# Patient Record
Sex: Female | Born: 1945 | Race: Black or African American | Hispanic: No | Smoking: Former smoker
Health system: Southern US, Community
[De-identification: ages and names within clinical notes are randomized; demographics above are authoritative.]

## PROBLEM LIST (undated history)

## (undated) DIAGNOSIS — E785 Hyperlipidemia, unspecified: Secondary | ICD-10-CM

## (undated) DIAGNOSIS — M25561 Pain in right knee: Secondary | ICD-10-CM

## (undated) DIAGNOSIS — E119 Type 2 diabetes mellitus without complications: Secondary | ICD-10-CM

## (undated) DIAGNOSIS — M25562 Pain in left knee: Secondary | ICD-10-CM

## (undated) DIAGNOSIS — C801 Malignant (primary) neoplasm, unspecified: Secondary | ICD-10-CM

## (undated) DIAGNOSIS — R636 Underweight: Secondary | ICD-10-CM

## (undated) DIAGNOSIS — I639 Cerebral infarction, unspecified: Secondary | ICD-10-CM

## (undated) DIAGNOSIS — I10 Essential (primary) hypertension: Secondary | ICD-10-CM

## (undated) DIAGNOSIS — G629 Polyneuropathy, unspecified: Secondary | ICD-10-CM

## (undated) HISTORY — DX: Pain in right knee: M25.562

## (undated) HISTORY — DX: Polyneuropathy, unspecified: G62.9

## (undated) HISTORY — DX: Underweight: R63.6

## (undated) HISTORY — DX: Hyperlipidemia, unspecified: E78.5

## (undated) HISTORY — DX: Malignant (primary) neoplasm, unspecified: C80.1

## (undated) HISTORY — DX: Pain in right knee: M25.561

---

## 2012-06-17 ENCOUNTER — Other Ambulatory Visit (HOSPITAL_COMMUNITY): Payer: Self-pay | Admitting: Family Medicine

## 2012-06-17 DIAGNOSIS — Z1231 Encounter for screening mammogram for malignant neoplasm of breast: Secondary | ICD-10-CM

## 2012-08-06 ENCOUNTER — Ambulatory Visit (HOSPITAL_COMMUNITY)
Admission: RE | Admit: 2012-08-06 | Discharge: 2012-08-06 | Disposition: A | Discharge status: Home / Self Care | Payer: Medicare Other | Source: Ambulatory Visit | Attending: Family Medicine | Admitting: Family Medicine

## 2012-08-06 DIAGNOSIS — Z1231 Encounter for screening mammogram for malignant neoplasm of breast: Secondary | ICD-10-CM | POA: Insufficient documentation

## 2012-08-27 ENCOUNTER — Other Ambulatory Visit: Payer: Self-pay | Admitting: Family Medicine

## 2012-08-27 DIAGNOSIS — R928 Other abnormal and inconclusive findings on diagnostic imaging of breast: Secondary | ICD-10-CM

## 2012-10-06 ENCOUNTER — Other Ambulatory Visit: Payer: Medicare Other

## 2012-10-13 ENCOUNTER — Ambulatory Visit
Admission: RE | Admit: 2012-10-13 | Discharge: 2012-10-13 | Disposition: A | Discharge status: Home / Self Care | Payer: Medicare Other | Source: Ambulatory Visit | Attending: Family Medicine | Admitting: Family Medicine

## 2012-10-13 DIAGNOSIS — R928 Other abnormal and inconclusive findings on diagnostic imaging of breast: Secondary | ICD-10-CM

## 2013-09-23 ENCOUNTER — Other Ambulatory Visit (HOSPITAL_COMMUNITY): Payer: Self-pay | Admitting: Family Medicine

## 2013-09-23 DIAGNOSIS — Z1231 Encounter for screening mammogram for malignant neoplasm of breast: Secondary | ICD-10-CM

## 2013-10-05 ENCOUNTER — Ambulatory Visit (HOSPITAL_COMMUNITY)
Admission: RE | Admit: 2013-10-05 | Discharge: 2013-10-05 | Disposition: A | Discharge status: Home / Self Care | Payer: Medicare HMO | Source: Ambulatory Visit | Attending: Family Medicine | Admitting: Family Medicine

## 2013-10-05 DIAGNOSIS — Z1231 Encounter for screening mammogram for malignant neoplasm of breast: Secondary | ICD-10-CM | POA: Insufficient documentation

## 2016-10-23 ENCOUNTER — Other Ambulatory Visit: Payer: Self-pay | Admitting: Family Medicine

## 2016-10-23 DIAGNOSIS — Z1231 Encounter for screening mammogram for malignant neoplasm of breast: Secondary | ICD-10-CM

## 2016-11-14 ENCOUNTER — Ambulatory Visit: Payer: Medicare HMO

## 2016-11-14 ENCOUNTER — Ambulatory Visit
Admission: RE | Admit: 2016-11-14 | Discharge: 2016-11-14 | Disposition: A | Discharge status: Home / Self Care | Payer: Medicare HMO | Source: Ambulatory Visit | Attending: Family Medicine | Admitting: Family Medicine

## 2016-11-14 DIAGNOSIS — Z1231 Encounter for screening mammogram for malignant neoplasm of breast: Secondary | ICD-10-CM

## 2017-05-24 ENCOUNTER — Ambulatory Visit
Admission: RE | Admit: 2017-05-24 | Discharge: 2017-05-24 | Disposition: A | Discharge status: Home / Self Care | Payer: Medicare HMO | Source: Ambulatory Visit | Attending: Physician Assistant | Admitting: Physician Assistant

## 2017-05-24 ENCOUNTER — Other Ambulatory Visit: Payer: Self-pay | Admitting: Physician Assistant

## 2017-05-24 DIAGNOSIS — G8929 Other chronic pain: Secondary | ICD-10-CM

## 2017-05-24 DIAGNOSIS — M25561 Pain in right knee: Principal | ICD-10-CM

## 2017-12-26 ENCOUNTER — Other Ambulatory Visit: Payer: Self-pay

## 2017-12-26 ENCOUNTER — Emergency Department (HOSPITAL_COMMUNITY): Payer: Medicare HMO

## 2017-12-26 ENCOUNTER — Emergency Department (HOSPITAL_COMMUNITY)
Admission: EM | Admit: 2017-12-26 | Discharge: 2017-12-26 | Disposition: A | Discharge status: Home / Self Care | Payer: Medicare HMO | Attending: Emergency Medicine | Admitting: Emergency Medicine

## 2017-12-26 ENCOUNTER — Encounter (HOSPITAL_COMMUNITY): Payer: Self-pay | Admitting: Emergency Medicine

## 2017-12-26 DIAGNOSIS — N939 Abnormal uterine and vaginal bleeding, unspecified: Secondary | ICD-10-CM | POA: Insufficient documentation

## 2017-12-26 DIAGNOSIS — I10 Essential (primary) hypertension: Secondary | ICD-10-CM | POA: Insufficient documentation

## 2017-12-26 DIAGNOSIS — D649 Anemia, unspecified: Secondary | ICD-10-CM | POA: Diagnosis present

## 2017-12-26 DIAGNOSIS — N390 Urinary tract infection, site not specified: Secondary | ICD-10-CM | POA: Diagnosis not present

## 2017-12-26 DIAGNOSIS — E119 Type 2 diabetes mellitus without complications: Secondary | ICD-10-CM | POA: Insufficient documentation

## 2017-12-26 DIAGNOSIS — Z7902 Long term (current) use of antithrombotics/antiplatelets: Secondary | ICD-10-CM | POA: Diagnosis not present

## 2017-12-26 DIAGNOSIS — Z7984 Long term (current) use of oral hypoglycemic drugs: Secondary | ICD-10-CM | POA: Diagnosis not present

## 2017-12-26 HISTORY — DX: Type 2 diabetes mellitus without complications: E11.9

## 2017-12-26 HISTORY — DX: Cerebral infarction, unspecified: I63.9

## 2017-12-26 HISTORY — DX: Essential (primary) hypertension: I10

## 2017-12-26 LAB — COMPREHENSIVE METABOLIC PANEL
ALT: 10 U/L — AB (ref 14–54)
AST: 20 U/L (ref 15–41)
Albumin: 3.5 g/dL (ref 3.5–5.0)
Alkaline Phosphatase: 93 U/L (ref 38–126)
Anion gap: 12 (ref 5–15)
BUN: 15 mg/dL (ref 6–20)
CO2: 23 mmol/L (ref 22–32)
Calcium: 9.5 mg/dL (ref 8.9–10.3)
Chloride: 98 mmol/L — ABNORMAL LOW (ref 101–111)
Creatinine, Ser: 0.67 mg/dL (ref 0.44–1.00)
Glucose, Bld: 170 mg/dL — ABNORMAL HIGH (ref 65–99)
Potassium: 4.5 mmol/L (ref 3.5–5.1)
Sodium: 133 mmol/L — ABNORMAL LOW (ref 135–145)
Total Bilirubin: 1 mg/dL (ref 0.3–1.2)
Total Protein: 9.2 g/dL — ABNORMAL HIGH (ref 6.5–8.1)

## 2017-12-26 LAB — URINALYSIS, ROUTINE W REFLEX MICROSCOPIC
Bilirubin Urine: NEGATIVE
GLUCOSE, UA: NEGATIVE mg/dL
Ketones, ur: NEGATIVE mg/dL
Nitrite: NEGATIVE
PROTEIN: 30 mg/dL — AB
Specific Gravity, Urine: 1.019 (ref 1.005–1.030)
pH: 5 (ref 5.0–8.0)

## 2017-12-26 LAB — TYPE AND SCREEN
ABO/RH(D): O POS
ANTIBODY SCREEN: POSITIVE
DAT, IgG: POSITIVE

## 2017-12-26 LAB — CBC
HCT: 28.7 % — ABNORMAL LOW (ref 36.0–46.0)
Hemoglobin: 8.8 g/dL — ABNORMAL LOW (ref 12.0–15.0)
MCH: 30.7 pg (ref 26.0–34.0)
MCHC: 30.7 g/dL (ref 30.0–36.0)
MCV: 100 fL (ref 78.0–100.0)
Platelets: 486 10*3/uL — ABNORMAL HIGH (ref 150–400)
RBC: 2.87 MIL/uL — ABNORMAL LOW (ref 3.87–5.11)
RDW: 17.7 % — ABNORMAL HIGH (ref 11.5–15.5)
WBC: 16.4 10*3/uL — AB (ref 4.0–10.5)

## 2017-12-26 MED ORDER — CEPHALEXIN 500 MG PO CAPS: 500.0000 mg | ORAL_CAPSULE | Freq: Four times a day (QID) | ORAL | 0 refills | Status: DC

## 2017-12-26 NOTE — ED Notes (Signed)
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 Pt currently being discharged, will not need transfusion during this visit. Per MD butler

## 2017-12-26 NOTE — ED Notes (Signed)
Patient reports she fell last Sunday (04/28) and last Thursday from a slip and trip. Patient was seen by Dr. Lawerance Cruel yesterday. Blood work was taken. Patient reports Dr. Harrington Challenger informing her that she had an abnormal HGB but unsure of the value. Also, informed to be seen at ED for further evaluation. Instead of being seen yesterday, she decided to be seen this morning.

## 2017-12-26 NOTE — ED Triage Notes (Signed)
Pt verbalizes sent for abnormal hemoglobin; hx of vaginal bleeding; denies at present.

## 2017-12-26 NOTE — Discharge Instructions (Signed)
You were evaluated in the emergency department for anemia.  This probably explains your fatigue although we also identified that you have a urine infection.  We are placing you on antibiotics for this.  Also you have been experiencing some vaginal bleeding which is abnormal after menopause.  You will need to discuss with your doctor to get a biopsy.  You had an ultrasound here.  Your anemia is not bad enough to require transfusion here but this may be necessary in the future.  Please return to the emergency department if any worsening symptoms.

## 2017-12-26 NOTE — ED Provider Notes (Signed)
[facility] DEPT Provider Note   CSN: [id number] Arrival date & time: 12/26/17  0920     History   Chief Complaint Chief Complaint  Patient presents with  . Abnormal Lab    HPI Katie Myers is a 72 y.o. female.  She is brought in by her daughter for complaint of abnormal blood test results.  She had a routine visit by her PCP yesterday who drew some blood work and called her today telling that they need to go to the emergency department for anemia.  Patient has been complaining of increased fatigue over the last 4 or 5 months.  She has had a couple of falls.  She is also mention that intermittently she will have some spotting vaginal bleeding.  She denies any fevers chills weight loss chest pain shortness of breath abdominal pain urinary symptoms.  There is been no blood in vomit or stool.  She does not get any kind of gynecologic yearly testing since menopause.  The history is provided by the patient.  Weakness  Primary symptoms include no focal weakness, no speech change, no visual change, no auditory change. This is a new problem. Episode onset: 4-5 months. The problem has been gradually worsening. There was no focality noted. There has been no fever. Pertinent negatives include no shortness of breath, no chest pain, no vomiting, no altered mental status, no confusion and no headaches. There were no medications administered prior to arrival.    Past Medical History:  Diagnosis Date  . Diabetes mellitus without complication ([place])   . Hypertension   . Stroke [facility])     There are no active problems to display for this patient.   History reviewed. No pertinent surgical history.   OB History   None      Home Medications    Prior to Admission medications   Medication Sig Start Date End Date Taking? Authorizing Provider  amLODipine (NORVASC) 10 MG tablet Take 10 mg by mouth daily.   Yes [provider]  atorvastatin (LIPITOR) 40 MG  tablet Take 40 mg by mouth daily.   Yes [provider]  benazepril (LOTENSIN) 40 MG tablet Take 40 mg by mouth daily.   Yes [provider]  carvedilol (COREG) 3.125 MG tablet Take 3.125 mg by mouth 2 (two) times daily with a meal.   Yes [provider]  clopidogrel (PLAVIX) 75 MG tablet Take 75 mg by mouth daily.   Yes [provider]  gabapentin (NEURONTIN) 300 MG capsule Take 300 mg by mouth 4 (four) times daily.   Yes [provider]  glipiZIDE (GLUCOTROL XL) 10 MG 24 hr tablet Take 10 mg by mouth daily with breakfast.   Yes [provider]  hydrochlorothiazide (HYDRODIURIL) 25 MG tablet Take 25 mg by mouth daily.   Yes [provider]  ibuprofen (ADVIL,MOTRIN) 200 MG tablet Take 200 mg by mouth daily. Pt takes with coreg because coreg gives her headaches   Yes [provider]  metFORMIN (GLUCOPHAGE) 500 MG tablet Take 1,000 mg by mouth 2 (two) times daily with a meal.   Yes [provider]  oxyCODONE-acetaminophen (PERCOCET) 7.5-325 MG tablet Take 1 tablet by mouth every 8 (eight) hours as needed for severe pain.   Yes [provider]  potassium chloride (K-DUR) 10 MEQ tablet Take 10 mEq by mouth daily.   Yes [provider]  cephALEXin (KEFLEX) 500 MG capsule Take 1 capsule (500 mg total) by mouth  4 (four) times daily. 12/26/17   Hayden Rasmussen, MD    Family History No family history on file.  Social History Social History   Tobacco Use  . Smoking status: Not on file  Substance Use Topics  . Alcohol use: Not on file  . Drug use: Not on file  denies smoking or alcohol   Allergies   Patient has no known allergies.   Review of Systems Review of Systems  Constitutional: Positive for fatigue. Negative for chills and fever.  HENT: Negative for ear pain and sore throat.   Eyes: Negative for pain and visual disturbance.  Respiratory: Negative for cough and shortness of breath.     Cardiovascular: Negative for chest pain and palpitations.  Gastrointestinal: Negative for abdominal pain and vomiting.  Genitourinary: Positive for vaginal bleeding. Negative for dysuria and hematuria.  Musculoskeletal: Positive for arthralgias. Negative for back pain and neck pain.  Skin: Negative for color change and rash.  Neurological: Positive for weakness. Negative for speech change, focal weakness, seizures, syncope and headaches.  Psychiatric/Behavioral: Negative for confusion.  All other systems reviewed and are negative.    Physical Exam Updated Vital Signs BP (!) 128/58 (BP Location: Right Arm)   Pulse 98   Temp 98.4 F (36.9 C) (Oral)   Resp 18   SpO2 100%   Physical Exam  Constitutional: She appears well-developed and well-nourished. No distress.  HENT:  Head: Normocephalic and atraumatic.  Eyes: Conjunctivae are normal.  Neck: Neck supple.  Cardiovascular: Normal rate and regular rhythm.  No murmur heard. Pulmonary/Chest: Effort normal and breath sounds normal. No respiratory distress.  Abdominal: Soft. There is no tenderness.  Genitourinary:  Genitourinary Comments: Pelvic with nurse Claiborne Billings a chaperone.  She had normal external genitalia.  Speculum exam with no obvious mass or bleeding.  She had some thin yellow discharge.  On bimanual exam there was no adnexal tenderness she did have some uterine tenderness.  It seemed enlarged.  I could not identify the cervix to the patient's discomfort.  Musculoskeletal: Normal range of motion. She exhibits no edema or deformity.  Neurological: She is alert. She has normal strength. No cranial nerve deficit or sensory deficit. Gait normal. GCS eye subscore is 4. GCS verbal subscore is 5. GCS motor subscore is 6.  Skin: Skin is warm and dry.  Psychiatric: She has a normal mood and affect.  Nursing note and vitals reviewed.    ED Treatments / Results  Labs (all labs ordered are listed, but only abnormal results are  displayed) Labs Reviewed  COMPREHENSIVE METABOLIC PANEL - Abnormal; Notable for the following components:      Result Value   Sodium 133 (*)    Chloride 98 (*)    Glucose, Bld 170 (*)    Total Protein 9.2 (*)    ALT 10 (*)    All other components within normal limits  CBC - Abnormal; Notable for the following components:   WBC 16.4 (*)    RBC 2.87 (*)    Hemoglobin 8.8 (*)    HCT 28.7 (*)    RDW 17.7 (*)    Platelets 486 (*)    All other components within normal limits  URINALYSIS, ROUTINE W REFLEX MICROSCOPIC - Abnormal; Notable for the following components:   Color, Urine AMBER (*)    APPearance CLOUDY (*)    Hgb urine dipstick SMALL (*)    Protein, ur 30 (*)    Leukocytes, UA LARGE (*)    WBC,  UA >50 (*)    Bacteria, UA RARE (*)    All other components within normal limits  TYPE AND SCREEN    EKG None  Radiology US Pelvic Complete With Transvaginal  Result Date: 12/26/2017 CLINICAL DATA:  Patient with vaginal bleeding. EXAM: TRANSABDOMINAL AND TRANSVAGINAL ULTRASOUND OF PELVIS TECHNIQUE: Both transabdominal and transvaginal ultrasound examinations of the pelvis were performed. Transabdominal technique was performed for global imaging of the pelvis including uterus, ovaries, adnexal regions, and pelvic cul-de-sac. It was necessary to proceed with endovaginal exam following the transabdominal exam to visualize the endometrium. COMPARISON:  None FINDINGS: Uterus Measurements: 10.7 x 8.2 x 7.7 cm. Within the posterior uterine body there is a 2.9 x 2.0 x 1.8 cm calcified fibroid, potentially with a submucosal component. Endometrium Thickness: 10 mm.  No focal abnormality visualized. Right ovary Not visualized. Left ovary Not visualized. Other findings No abnormal free fluid. IMPRESSION: Limited exam due to difficulty with visualization of the uterus and endometrium. Calcified submucosal fibroid. Endometrium measures 10 mm. Endometrium is poorly visualized and not adequately assessed.  If bleeding remains unresponsive to hormonal or medical therapy, sonohysterogram should be considered for focal lesion work-up. (Ref: Radiological Reasoning: Algorithmic Workup of Abnormal Vaginal Bleeding with Endovaginal Sonography and Sonohysterography. AJR 2008; 578:I69-62) Electronically Signed   By: Lovey Newcomer M.D.   On: 12/26/2017 12:20    Procedures Procedures (including critical care time)  Medications Ordered in ED Medications - No data to display   Initial Impression / Assessment and Plan / ED Course  I have reviewed the triage vital signs and the nursing notes.  Pertinent labs & imaging results that were available during my care of the patient were reviewed by me and considered in my medical decision making (see chart for details).  Clinical Course as of Dec 27 1807  Thu Dec 26, 2017  1403 Reviewed results with the patient and the daughter.  They understand we are going to treated with some antibiotics and refer her back to her PCP for further evaluation of her anemia and her vaginal bleeding.  She will likely need to be referred on to gynecology for an endometrial biopsy if her PCP cannot accomplish this.  I do not feel she needs a transfusion currently but this will need to continue to be followed.   [MB]    Clinical Course User Index [MB] Hayden Rasmussen, MD      Final Clinical Impressions(s) / ED Diagnoses   Final diagnoses:  Vaginal bleeding, abnormal  Symptomatic anemia  Urinary tract infection in female    ED Discharge Orders        Ordered    cephALEXin (KEFLEX) 500 MG capsule  4 times daily     12/26/17 1410       Hayden Rasmussen, MD 12/26/17 1810

## 2018-10-22 ENCOUNTER — Encounter: Payer: Self-pay | Admitting: Oncology

## 2018-10-22 ENCOUNTER — Telehealth: Payer: Self-pay | Admitting: Oncology

## 2018-10-22 NOTE — Telephone Encounter (Signed)
Katie Myers called back with her daughter on the line.  Confirmed appointment on Monday, 10/27/18 at 2:45.  They would like transportation arranged.  Advised them that [name], Transportation Coordinator will be contacted to call them.

## 2018-10-22 NOTE — Telephone Encounter (Signed)
Left a message for patient with appointment to see Dr. Alvy Bimler on 10/27/18 at 2:45 pm.  Requested a return call.

## 2018-10-23 ENCOUNTER — Encounter: Payer: Self-pay | Admitting: Hematology and Oncology

## 2018-10-23 ENCOUNTER — Ambulatory Visit
Admission: RE | Admit: 2018-10-23 | Discharge: 2018-10-23 | Disposition: A | Discharge status: Home / Self Care | Payer: Self-pay | Source: Ambulatory Visit | Attending: Hematology and Oncology | Admitting: Hematology and Oncology

## 2018-10-23 ENCOUNTER — Telehealth: Payer: Self-pay | Admitting: Hematology and Oncology

## 2018-10-23 ENCOUNTER — Other Ambulatory Visit: Payer: Self-pay | Admitting: Oncology

## 2018-10-23 DIAGNOSIS — C539 Malignant neoplasm of cervix uteri, unspecified: Secondary | ICD-10-CM

## 2018-10-23 DIAGNOSIS — C7800 Secondary malignant neoplasm of unspecified lung: Secondary | ICD-10-CM | POA: Insufficient documentation

## 2018-10-23 DIAGNOSIS — D500 Iron deficiency anemia secondary to blood loss (chronic): Secondary | ICD-10-CM | POA: Insufficient documentation

## 2018-10-23 DIAGNOSIS — C779 Secondary and unspecified malignant neoplasm of lymph node, unspecified: Secondary | ICD-10-CM | POA: Insufficient documentation

## 2018-10-23 NOTE — Telephone Encounter (Signed)
Left voicemail for patient regarding the transportation program and my contact information.

## 2018-10-27 ENCOUNTER — Telehealth: Payer: Self-pay | Admitting: Hematology and Oncology

## 2018-10-27 ENCOUNTER — Encounter: Payer: Self-pay | Admitting: Hematology and Oncology

## 2018-10-27 ENCOUNTER — Inpatient Hospital Stay: Payer: Medicare HMO | Attending: Hematology and Oncology | Admitting: Hematology and Oncology

## 2018-10-27 ENCOUNTER — Other Ambulatory Visit: Payer: Self-pay | Admitting: Hematology and Oncology

## 2018-10-27 ENCOUNTER — Telehealth: Payer: Self-pay | Admitting: Oncology

## 2018-10-27 ENCOUNTER — Encounter: Payer: Self-pay | Admitting: Oncology

## 2018-10-27 VITALS — BP 133/66 | HR 93 | Temp 98.2°F | Resp 18 | Ht 64.17 in | Wt 130.2 lb

## 2018-10-27 DIAGNOSIS — E119 Type 2 diabetes mellitus without complications: Secondary | ICD-10-CM | POA: Insufficient documentation

## 2018-10-27 DIAGNOSIS — C539 Malignant neoplasm of cervix uteri, unspecified: Secondary | ICD-10-CM

## 2018-10-27 DIAGNOSIS — R5381 Other malaise: Secondary | ICD-10-CM | POA: Insufficient documentation

## 2018-10-27 DIAGNOSIS — Z5111 Encounter for antineoplastic chemotherapy: Secondary | ICD-10-CM | POA: Diagnosis present

## 2018-10-27 DIAGNOSIS — Z794 Long term (current) use of insulin: Secondary | ICD-10-CM | POA: Diagnosis not present

## 2018-10-27 DIAGNOSIS — Z79899 Other long term (current) drug therapy: Secondary | ICD-10-CM | POA: Insufficient documentation

## 2018-10-27 DIAGNOSIS — E1140 Type 2 diabetes mellitus with diabetic neuropathy, unspecified: Secondary | ICD-10-CM | POA: Diagnosis not present

## 2018-10-27 DIAGNOSIS — Z7982 Long term (current) use of aspirin: Secondary | ICD-10-CM | POA: Diagnosis not present

## 2018-10-27 DIAGNOSIS — E1165 Type 2 diabetes mellitus with hyperglycemia: Secondary | ICD-10-CM | POA: Insufficient documentation

## 2018-10-27 DIAGNOSIS — Z720 Tobacco use: Secondary | ICD-10-CM | POA: Insufficient documentation

## 2018-10-27 DIAGNOSIS — IMO0002 Reserved for concepts with insufficient information to code with codable children: Secondary | ICD-10-CM | POA: Insufficient documentation

## 2018-10-27 DIAGNOSIS — R64 Cachexia: Secondary | ICD-10-CM | POA: Diagnosis not present

## 2018-10-27 DIAGNOSIS — C779 Secondary and unspecified malignant neoplasm of lymph node, unspecified: Secondary | ICD-10-CM | POA: Diagnosis not present

## 2018-10-27 DIAGNOSIS — D500 Iron deficiency anemia secondary to blood loss (chronic): Secondary | ICD-10-CM | POA: Diagnosis not present

## 2018-10-27 DIAGNOSIS — Z8673 Personal history of transient ischemic attack (TIA), and cerebral infarction without residual deficits: Secondary | ICD-10-CM | POA: Diagnosis not present

## 2018-10-27 DIAGNOSIS — R808 Other proteinuria: Secondary | ICD-10-CM | POA: Insufficient documentation

## 2018-10-27 DIAGNOSIS — C7800 Secondary malignant neoplasm of unspecified lung: Secondary | ICD-10-CM | POA: Insufficient documentation

## 2018-10-27 DIAGNOSIS — Z7189 Other specified counseling: Secondary | ICD-10-CM | POA: Insufficient documentation

## 2018-10-27 DIAGNOSIS — I10 Essential (primary) hypertension: Secondary | ICD-10-CM | POA: Insufficient documentation

## 2018-10-27 NOTE — Telephone Encounter (Signed)
Left a message for patient regarding port appointment on 11/05/18 at [facility] with arrival at 7 am, NPO after midnight and the she will need a driver.

## 2018-10-27 NOTE — Telephone Encounter (Signed)
Waiting for nutrition

## 2018-10-27 NOTE — Progress Notes (Signed)
OFF PATHWAY REGIMEN - Other Dx  No Change  Continue With Treatment as Ordered.   [id number]:Bevacizumab 15 mg/kg IV D1 + Carboplatin AUC=5 IV D1 + Paclitaxel 175 mg/m2 IV D1 q21 Days:   A cycle is every 21 days:     Bevacizumab-xxxx      Paclitaxel      Carboplatin   **Always confirm dose/schedule in your pharmacy ordering system**  Patient Characteristics: Intent of Therapy: Non-Curative / Palliative Intent, Discussed with Patient 

## 2018-10-27 NOTE — Assessment & Plan Note (Signed)
She has baseline peripheral neuropathy due to diabetes I plan to reduce the dose of paclitaxel because of this

## 2018-10-27 NOTE — Assessment & Plan Note (Signed)
We discussed briefly about goals of care With stage IV disease, treatment goal is palliative

## 2018-10-27 NOTE — Assessment & Plan Note (Signed)
Her blood pressure is now normal due to recent weight loss I recommend discontinuation of hydrochlorothiazide due to risk of dehydration with poor oral intake

## 2018-10-27 NOTE — Assessment & Plan Note (Signed)
We discussed the importance of nicotine cessation The patient is attempting to quit smoking on her own

## 2018-10-27 NOTE — Assessment & Plan Note (Addendum)
I will reduce premedication dexamethasone due to her diabetes history I recommend her to establish with a new primary care doctor for chronic health issue management

## 2018-10-27 NOTE — Assessment & Plan Note (Signed)
She has history of recurrent falls I recommend physical therapy referral for cancer rehab

## 2018-10-27 NOTE — Assessment & Plan Note (Signed)
She is not symptomatic. Observe for now 

## 2018-10-27 NOTE — Assessment & Plan Note (Addendum)
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

## 2018-10-27 NOTE — Progress Notes (Signed)
START OFF PATHWAY REGIMEN - Other Dx   [id number]:Bevacizumab 15 mg/kg IV D1 + Carboplatin AUC=5 IV D1 + Paclitaxel 175 mg/m2 IV D1 q21 Days:   A cycle is every 21 days:     Bevacizumab-xxxx      Paclitaxel      Carboplatin   **Always confirm dose/schedule in your pharmacy ordering system**  Patient Characteristics: Intent of Therapy: Non-Curative / Palliative Intent, Discussed with Patient

## 2018-10-27 NOTE — Assessment & Plan Note (Signed)
I recommend her to hold Plavix for a week in anticipation for port placement In the meantime, she will take 81 mg aspirin therapy

## 2018-10-27 NOTE — Assessment & Plan Note (Signed)
She had history of microcytic anemia with thrombocytosis most consistent with iron deficiency anemia from chronic bleeding I plan to recheck iron studies in the next visit

## 2018-10-27 NOTE — Progress Notes (Signed)
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  Observe for now  Diabetes mellitus without complication (Katie Myers) I will reduce premedication dexamethasone due to her diabetes history I recommend her to establish with a new primary care doctor for chronic health issue management  Essential hypertension Her blood pressure is now normal due to recent weight loss I recommend discontinuation of hydrochlorothiazide due to risk of dehydration with poor oral intake  History of stroke I recommend her to hold Plavix for a week in anticipation for port placement In the meantime, she will take 81 mg aspirin therapy  Iron deficiency anemia due to chronic blood loss She had history of microcytic anemia with thrombocytosis most consistent with iron deficiency anemia from chronic bleeding I plan to recheck iron studies in the next visit  Tobacco abuse We discussed the importance of nicotine cessation The patient is attempting to quit smoking on her own  Uncontrolled type 2 diabetes mellitus with diabetic neuropathy, without long-term current use of insulin (Katie Myers) She has baseline peripheral neuropathy due to diabetes I plan to reduce the dose of paclitaxel because of  this  Malignant cachexia (Katie Myers) Her cancer cachexia is likely due to untreated cancer I will see her back after chemotherapy to reassess before placing her on appetite stimulant I will get dietitian to see her next week I will check thyroid function test  Physical debility She has history of recurrent falls I recommend physical therapy referral for cancer rehab  Goals of care, counseling/discussion We discussed briefly about goals of care With stage IV disease, treatment goal is palliative   Orders Placed This Encounter  Procedures  . IR IMAGING GUIDED PORT INSERTION    Standing Status:   Future    Standing Expiration Date:   12/27/2019    Order Specific Question:   Reason for Exam (SYMPTOM  OR DIAGNOSIS REQUIRED)    Answer:   need port for chemo. earlierst on 3/16, took Plavix last on 3/9    Order Specific Question:   Preferred Imaging Location?    Answer:   [name]  . CBC with Differential (Katie Myers Only)    Standing Status:   Standing    Number of Occurrences:   20    Standing Expiration Date:   10/27/2019  . CMP ([name])    Standing Status:   Standing    Number of Occurrences:   20    Standing Expiration Date:   10/27/2019  . Total Protein, Urine dipstick    Standing Status:   Standing    Number of Occurrences:   9    Standing Expiration Date:   10/27/2019  . TSH    Standing Status:   Future    Standing Expiration Date:   12/01/2019  . Iron and TIBC  Standing Status:   Future    Standing Expiration Date:   12/01/2019  . Ferritin    Standing Status:   Future    Standing Expiration Date:   12/01/2019  . Ambulatory referral to Physical Therapy    Referral Priority:   Routine    Referral Type:   Physical Medicine    Referral Reason:   Specialty Services Required    Requested Specialty:   Physical Therapy    Number of Visits Requested:   1  . Sample to Blood Bank    Standing Status:   Standing    Number of Occurrences:   33    Standing  Expiration Date:   10/27/2019     CHIEF COMPLAINTS/PURPOSE OF CONSULTATION:  Stage IV metastatic cervical cancer to lymph node and lungs, follow-up palliative chemotherapy  HISTORY OF PRESENTING ILLNESS:  Katie Myers 73 y.o. female is here because of diagnosis of cervical cancer. She is accompanied by multiple family members today The patient is retired She lives with her daughter, Katie Myers She has been complaining of postmenopausal bleeding since last year Subsequently, she underwent further evaluation, imaging studies and biopsy which showed metastatic cervical cancer Due to distance of travel, she has elected to receive chemotherapy locally I have reviewed her chart and materials related to her cancer extensively and collaborated history with the patient. Summary of oncologic history is as follows:   Cervical cancer (Katie Myers)   12/26/2017 Initial Diagnosis    She presented to the ER with symptomatic anemia and had postmenopausal bleeding. Bimanual exam at that time revealed abnormal exam    12/26/2017 Imaging    US pelvis Limited exam due to difficulty with visualization of the uterus and endometrium.  Calcified submucosal fibroid.  Endometrium measures 10 mm. Endometrium is poorly visualized and not adequately assessed. If bleeding remains unresponsive to hormonal or medical therapy, sonohysterogram should be considered for focal lesion work-up    05/29/2018 Pathology Results    1.  Endometrium, biopsy: - Atypical squamous epithelium with dysplastic features highly suspicious for malignancy.  Note: The biopsy specimen shows squamous epithelium and marked acute inflammation with associated individual squamous cells suggestive of tumor diathesis.  These individual squamous cells show koilocytic changes with cytological features approaching high-grade dysplasia.  Rare keratin pearls are identified.  Areas of more intact squamous epithelium show koilocytic changes, dyskeratosis, and areas of  increased mitotic activity (although tissue orientation precludes definitive determination of significance of this finding).  The specimen is negative for endometrial glands.    The clinical findings of a normal cervical exam and upper vaginal nodularity is noted.  Imaging findings of an abnormal uterus are also noted.  The overall findings are extremely concerning for a squamous cell carcinoma, with the differential including vaginal, uterine, or cervical in origin.  A block has been ordered for additional p16 immunohistochemical studies. If clinically indicated, additional tissue sampling may be useful for further diagnosis/classification.  An immunohistochemical stain for p16 is positive (strong blocklike reactivity) on intact fragments of squamous epithelium as well as on detached individual squamous cells. All controls showed appropriate reactivity.   Typically primary squamous cell carcinomas of the uterus are p16-negative, therefore the current positive p16 likely reflects an HPV-driven malignancy of cervical or vaginal origin.  High grade dysplasia with individual cells seen representing an element of degeneration in this sample is also technically possible and remains in the differential, however given the clinical and imaging findings I think this is less likely.  Additional clinical and radiological correlation recommended.    07/23/2018 Imaging    CT abdomen and pelvis IMPRESSION: 1. Abnormal uterus with gas in the endometrial cavity, multiple calcified fibroids, and a right rim-enhancing uterine fluid collection measuring 3.3 x 3.5 cm concerning for abscess image 128. This is contiguous with an edematous anterior abdominal wall.  There is soft tissue extension from the uterus to the right which may represent tumor. The uterus is overall poorly defined.  2. Pelvic, retroperitoneal, and portal adenopathy 3. Lung nodules as above. CT chest may be of benefit here. 4. Coronary and aortic  ASVD 5. Cholelithiasis 6. Bilateral renal cortical cyst 7. Aortoiliac atherosclerosis without aneurysm 8. Diverticulosis without diverticulitis 10. Fecal retention    09/09/2018 Pathology Results    1. 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  2. 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  3. Endometrium, curettage:  -Fragments of squamous cell carcinoma with areas of invasion, see comment.  4.  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  COMMENT: The biopsies all show fragments of squamous neoplasm ranging from squamous cell carcinoma in situ to areas of invasive carcinoma.  The previous biopsy (case [id number]) showed strong positive p16 staining.  This, coupled with the clinical impression and the vaginal biopsy, is consistent with a vaginal/cervical origin for this tumor.  The endometrial curettage shows no evidence of endometrial tissue.    10/21/2018 PET scan    1. Extensive abnormal activity throughout the uterus, consistent with malignancy. 2. Hypermetabolic malignant-appearing retroperitoneal, external iliac, and inguinal adenopathy.  3. Numerous bilateral pulmonary nodules, most of which are below PET resolution with the largest demonstrating low-level activity. These are indeterminate, but suspicious for metastatic disease. 4. Numerous enlarged and hypermetabolic mediastinal and bilateral hilar lymph nodes, concerning for metastatic disease.  5. Small lymph node adjacent to the left thyroid lobe with low level FDG activity is nonspecific.    10/23/2018 Cancer Staging    Staging form: Cervix Uteri, AJCC 8th Edition - Clinical: FIGO Stage IVB (cT2, cN1, cM1) - Signed by Heath Lark, MD on 10/23/2018    She denies excessive vaginal bleeding.  She denies nausea She has poor appetite and has lost a lot of weight She  has chronic peripheral neuropathy all the way to her ankles from diabetes She had history of recurrent falls x2 recently due to weakness She denies recent changes in bowel habits She denies recent cough, chest pain or shortness of breath She has chronic bilateral lower extremity edema She has not seen her primary care doctor recently for chronic health issues  MEDICAL HISTORY:  Past Medical History:  Diagnosis Date  . Cancer (HCC)    cervical cancer  . Diabetes mellitus without complication ([place])   . Hypertension   . Stroke [facility])     SURGICAL HISTORY: History reviewed. No pertinent surgical history.  SOCIAL HISTORY: Social History   Socioeconomic History  . Marital status: Widowed    Spouse name: Not on file  . Number of children: 1  . Years of education: Not on file  . Highest education level: Not on file  Occupational History  . Occupation: retired  Scientific laboratory technician  . Financial resource strain: Not on file  . Food insecurity:    Worry: Not on file    Inability: Not on file  . Transportation needs:  Medical: Not on file    Non-medical: Not on file  Tobacco Use  . Smoking status: Current Every Day Smoker    Packs/day: 0.50    Types: Cigarettes  . Smokeless tobacco: Never Used  Substance and Sexual Activity  . Alcohol use: Not on file  . Drug use: Not on file  . Sexual activity: Not on file  Lifestyle  . Physical activity:    Days per week: Not on file    Minutes per session: Not on file  . Stress: Not on file  Relationships  . Social connections:    Talks on phone: Not on file    Gets together: Not on file    Attends religious service: Not on file    Active member of club or organization: Not on file    Attends meetings of clubs or organizations: Not on file    Relationship status: Not on file  . Intimate partner violence:    Fear of current or ex partner: Not on file    Emotionally abused: Not on file    Physically abused: Not on file    Forced sexual  activity: Not on file  Other Topics Concern  . Not on file  Social History Narrative  . Not on file    FAMILY HISTORY: Family History  Problem Relation Age of Onset  . Cancer Mother        unknown cancer  . Cancer Maternal Aunt        breast ca  . Cancer Maternal Uncle        lung ca  . Cancer Maternal Grandfather        unknown cancer    ALLERGIES:  has No Known Allergies.  MEDICATIONS:  Current Outpatient Medications  Medication Sig Dispense Refill  . aspirin EC 81 MG tablet Take 81 mg by mouth daily.    Marland Kitchen amLODipine (NORVASC) 10 MG tablet Take 10 mg by mouth daily.    Marland Kitchen atorvastatin (LIPITOR) 40 MG tablet Take 40 mg by mouth daily.    . benazepril (LOTENSIN) 40 MG tablet Take 40 mg by mouth daily.    . carvedilol (COREG) 3.125 MG tablet Take 3.125 mg by mouth 2 (two) times daily with a meal.    . gabapentin (NEURONTIN) 300 MG capsule Take 300 mg by mouth 4 (four) times daily.    Marland Kitchen glipiZIDE (GLUCOTROL XL) 10 MG 24 hr tablet Take 10 mg by mouth daily with breakfast.    . ibuprofen (ADVIL,MOTRIN) 200 MG tablet Take 200 mg by mouth daily. Pt takes with coreg because coreg gives her headaches    . metFORMIN (GLUCOPHAGE) 500 MG tablet Take 1,000 mg by mouth 2 (two) times daily with a meal.    . oxyCODONE-acetaminophen (PERCOCET) 7.5-325 MG tablet Take 1 tablet by mouth every 8 (eight) hours as needed for severe pain.     No current facility-administered medications for this visit.     REVIEW OF SYSTEMS:   Constitutional: Denies fevers, chills or abnormal night sweats Eyes: Denies blurriness of vision, double vision or watery eyes Ears, nose, mouth, throat, and face: Denies mucositis or sore throat Respiratory: Denies cough, dyspnea or wheezes Cardiovascular: Denies palpitation, chest discomfort  Gastrointestinal:  Denies nausea, heartburn or change in bowel habits Skin: Denies abnormal skin rashes Lymphatics: Denies new lymphadenopathy or easy bruising Behavioral/Psych:  Mood is stable, no new changes  All other systems were reviewed with the patient and are negative.  PHYSICAL EXAMINATION: ECOG PERFORMANCE  STATUS: 2 - Symptomatic, <50% confined to bed  Vitals:   10/27/18 1456  BP: 133/66  Pulse: 93  Resp: 18  Temp: 98.2 F (36.8 C)  SpO2: 100%   Filed Weights   10/27/18 1456  Weight: 130 lb 3.2 oz (59.1 kg)    GENERAL:alert, no distress and comfortable.  She looks frail, thin and cachectic, sitting on the wheelchair SKIN: skin color, texture, turgor are normal, no rashes or significant lesions EYES: normal, conjunctiva are pink and non-injected, sclera clear OROPHARYNX:no exudate, no erythema and lips, buccal mucosa, and tongue normal  NECK: supple, thyroid normal size, non-tender, without nodularity LYMPH:  no palpable lymphadenopathy in the cervical, axillary or inguinal LUNGS: clear to auscultation and percussion with normal breathing effort HEART: regular rate & rhythm and no murmurs with moderate bilateral lower extremity edema ABDOMEN:abdomen soft, non-tender and normal bowel sounds Musculoskeletal:no cyanosis of digits and no clubbing  PSYCH: alert & oriented x 3 with fluent speech NEURO: no focal motor/sensory deficits  LABORATORY DATA:  I have reviewed the data as listed Lab Results  Component Value Date   WBC 16.4 (H) 12/26/2017   HGB 8.8 (L) 12/26/2017   HCT 28.7 (L) 12/26/2017   MCV 100.0 12/26/2017   PLT 486 (H) 12/26/2017   Recent Labs    12/26/17 1055  NA 133*  K 4.5  CL 98*  CO2 23  GLUCOSE 170*  BUN 15  CREATININE 0.67  CALCIUM 9.5  GFRNONAA >60  GFRAA >60  PROT 9.2*  ALBUMIN 3.5  AST 20  ALT 10*  ALKPHOS 93  BILITOT 1.0    RADIOGRAPHIC STUDIES: I have personally reviewed outside PET scan I have personally reviewed the radiological images as listed and agreed with the findings in the report.  I spent 60 minutes counseling the patient face to face. The total time spent in the appointment was 80  minutes and more than 50% was on counseling.  All questions were answered. The patient knows to call the clinic with any problems, questions or concerns.  Heath Lark, MD 10/27/2018 3:58 PM

## 2018-10-27 NOTE — Assessment & Plan Note (Signed)
Her cancer cachexia is likely due to untreated cancer I will see her back after chemotherapy to reassess before placing her on appetite stimulant I will get dietitian to see her next week I will check thyroid function test

## 2018-10-28 ENCOUNTER — Telehealth: Payer: Self-pay | Admitting: Hematology and Oncology

## 2018-10-28 NOTE — Telephone Encounter (Signed)
Called regarding nut appointment on 3/16

## 2018-10-31 ENCOUNTER — Ambulatory Visit: Payer: Medicare HMO | Admitting: Rehabilitation

## 2018-11-03 ENCOUNTER — Inpatient Hospital Stay: Payer: Medicare HMO | Admitting: Nutrition

## 2018-11-03 ENCOUNTER — Telehealth: Payer: Self-pay | Admitting: Hematology and Oncology

## 2018-11-03 ENCOUNTER — Encounter: Payer: Self-pay | Admitting: Nutrition

## 2018-11-03 ENCOUNTER — Other Ambulatory Visit: Payer: Self-pay

## 2018-11-03 ENCOUNTER — Encounter: Payer: Self-pay | Admitting: Hematology and Oncology

## 2018-11-03 ENCOUNTER — Inpatient Hospital Stay: Payer: Medicare HMO

## 2018-11-03 ENCOUNTER — Inpatient Hospital Stay (HOSPITAL_BASED_OUTPATIENT_CLINIC_OR_DEPARTMENT_OTHER): Payer: Medicare HMO | Admitting: Hematology and Oncology

## 2018-11-03 VITALS — BP 147/55 | HR 90 | Temp 98.0°F | Resp 18 | Ht 64.17 in | Wt 132.2 lb

## 2018-11-03 DIAGNOSIS — D500 Iron deficiency anemia secondary to blood loss (chronic): Secondary | ICD-10-CM

## 2018-11-03 DIAGNOSIS — E119 Type 2 diabetes mellitus without complications: Secondary | ICD-10-CM

## 2018-11-03 DIAGNOSIS — Z720 Tobacco use: Secondary | ICD-10-CM

## 2018-11-03 DIAGNOSIS — R64 Cachexia: Secondary | ICD-10-CM

## 2018-11-03 DIAGNOSIS — C539 Malignant neoplasm of cervix uteri, unspecified: Secondary | ICD-10-CM

## 2018-11-03 DIAGNOSIS — I10 Essential (primary) hypertension: Secondary | ICD-10-CM

## 2018-11-03 DIAGNOSIS — Z794 Long term (current) use of insulin: Secondary | ICD-10-CM

## 2018-11-03 DIAGNOSIS — C7800 Secondary malignant neoplasm of unspecified lung: Secondary | ICD-10-CM | POA: Diagnosis not present

## 2018-11-03 DIAGNOSIS — C779 Secondary and unspecified malignant neoplasm of lymph node, unspecified: Secondary | ICD-10-CM | POA: Diagnosis not present

## 2018-11-03 DIAGNOSIS — R5381 Other malaise: Secondary | ICD-10-CM

## 2018-11-03 DIAGNOSIS — Z5111 Encounter for antineoplastic chemotherapy: Secondary | ICD-10-CM | POA: Diagnosis not present

## 2018-11-03 DIAGNOSIS — Z7189 Other specified counseling: Secondary | ICD-10-CM

## 2018-11-03 DIAGNOSIS — R808 Other proteinuria: Secondary | ICD-10-CM

## 2018-11-03 DIAGNOSIS — IMO0002 Reserved for concepts with insufficient information to code with codable children: Secondary | ICD-10-CM

## 2018-11-03 DIAGNOSIS — E1140 Type 2 diabetes mellitus with diabetic neuropathy, unspecified: Secondary | ICD-10-CM

## 2018-11-03 DIAGNOSIS — E1165 Type 2 diabetes mellitus with hyperglycemia: Secondary | ICD-10-CM

## 2018-11-03 LAB — CMP (CANCER CENTER ONLY)
ALT: 12 U/L (ref 0–44)
AST: 26 U/L (ref 15–41)
Albumin: 2.9 g/dL — ABNORMAL LOW (ref 3.5–5.0)
Alkaline Phosphatase: 90 U/L (ref 38–126)
Anion gap: 8 (ref 5–15)
BUN: 16 mg/dL (ref 8–23)
CO2: 25 mmol/L (ref 22–32)
Calcium: 9.6 mg/dL (ref 8.9–10.3)
Chloride: 100 mmol/L (ref 98–111)
Creatinine: 0.76 mg/dL (ref 0.44–1.00)
GFR, Est AFR Am: >60 mL/min (ref >60)
GFR, Estimated: >60 mL/min (ref >60)
Glucose, Bld: 135 mg/dL — ABNORMAL HIGH (ref 70–99)
Potassium: 5.1 mmol/L (ref 3.5–5.1)
Sodium: 133 mmol/L — ABNORMAL LOW (ref 135–145)
TOTAL PROTEIN: 8.3 g/dL — AB (ref 6.5–8.1)
Total Bilirubin: 0.5 mg/dL (ref 0.3–1.2)

## 2018-11-03 LAB — CBC WITH DIFFERENTIAL (CANCER CENTER ONLY)
Abs Immature Granulocytes: 0.04 10*3/uL (ref 0.00–0.07)
BASOS PCT: 0 %
Basophils Absolute: 0 10*3/uL (ref 0.0–0.1)
Eosinophils Absolute: 0.1 10*3/uL (ref 0.0–0.5)
Eosinophils Relative: 1 %
HCT: 30.8 % — ABNORMAL LOW (ref 36.0–46.0)
Hemoglobin: 9.4 g/dL — ABNORMAL LOW (ref 12.0–15.0)
Immature Granulocytes: 0 %
Lymphocytes Relative: 11 %
Lymphs Abs: 1.3 10*3/uL (ref 0.7–4.0)
MCH: 31.3 pg (ref 26.0–34.0)
MCHC: 30.5 g/dL (ref 30.0–36.0)
MCV: 102.7 fL — ABNORMAL HIGH (ref 80.0–100.0)
Monocytes Absolute: 0.5 10*3/uL (ref 0.1–1.0)
Monocytes Relative: 4 %
NEUTROS ABS: 9.5 10*3/uL — AB (ref 1.7–7.7)
Neutrophils Relative %: 84 %
PLATELETS: 416 10*3/uL — AB (ref 150–400)
RBC: 3 MIL/uL — AB (ref 3.87–5.11)
RDW: 14.6 % (ref 11.5–15.5)
WBC Count: 11.4 10*3/uL — ABNORMAL HIGH (ref 4.0–10.5)
nRBC: 0 % (ref 0.0–0.2)

## 2018-11-03 LAB — TOTAL PROTEIN, URINE DIPSTICK: PROTEIN: 100 mg/dL — AB

## 2018-11-03 LAB — IRON AND TIBC
Iron: 38 ug/dL — ABNORMAL LOW (ref 41–142)
Saturation Ratios: 15 % — ABNORMAL LOW (ref 21–57)
TIBC: 249 ug/dL (ref 236–444)
UIBC: 211 ug/dL (ref 120–384)

## 2018-11-03 LAB — SAMPLE TO BLOOD BANK

## 2018-11-03 LAB — FERRITIN: Ferritin: 115 ng/mL (ref 11–307)

## 2018-11-03 LAB — TSH: TSH: 1.767 u[IU]/mL (ref 0.308–3.960)

## 2018-11-03 MED ORDER — PROCHLORPERAZINE MALEATE 10 MG PO TABS: 10.0000 mg | ORAL_TABLET | Freq: Four times a day (QID) | ORAL | 1 refills | Status: DC | PRN

## 2018-11-03 MED ORDER — ONDANSETRON HCL 8 MG PO TABS: 8.0000 mg | ORAL_TABLET | Freq: Three times a day (TID) | ORAL | 1 refills | Status: AC | PRN

## 2018-11-03 MED ORDER — DEXAMETHASONE 4 MG PO TABS: ORAL_TABLET | ORAL | 0 refills | Status: DC

## 2018-11-03 MED ORDER — LIDOCAINE-PRILOCAINE 2.5-2.5 % EX CREA: TOPICAL_CREAM | CUTANEOUS | 3 refills | Status: AC

## 2018-11-03 NOTE — Assessment & Plan Note (Signed)
She is not symptomatic. Observe for now 

## 2018-11-03 NOTE — Assessment & Plan Note (Signed)
She is scheduled to see physical therapy at the cancer rehab center.  She will continue the same

## 2018-11-03 NOTE — Assessment & Plan Note (Signed)
I will reduce premedication dexamethasone due to her diabetes history I recommend her to establish with a new primary care doctor for chronic health issue management

## 2018-11-03 NOTE — Assessment & Plan Note (Signed)
She has gained some weight since last time I saw her I have scheduled her to see dietitian for dietary advice

## 2018-11-03 NOTE — Assessment & Plan Note (Signed)
She has heavy proteinuria could be related to her diabetes or hypertension I will hold Avastin this cycle We discussed the importance of getting her blood pressure under control over the next few weeks

## 2018-11-03 NOTE — Progress Notes (Signed)
Patient cancelled scheduled appointment today. There is no RD available on Friday, March 20. She has been rescheduled for March 30 during blood infusion.

## 2018-11-03 NOTE — Assessment & Plan Note (Signed)
She has baseline peripheral neuropathy due to diabetes I plan to reduce the dose of paclitaxel because of this

## 2018-11-03 NOTE — Assessment & Plan Note (Addendum)
Her blood pressure is better since she has gained some weight She is instructed to check her blood pressure twice a day I will adjust her blood pressure as needed until she is established to see her primary care doctor

## 2018-11-03 NOTE — Telephone Encounter (Signed)
Gave avs and calendar ° °

## 2018-11-03 NOTE — Progress Notes (Signed)
[place] OFFICE PROGRESS NOTE  Patient Care Team: [name], MD as PCP - General (Family Medicine) Polly Cobia, Dayton Bailiff, MD as Referring Physician (Obstetrics and Gynecology)  ASSESSMENT & PLAN:  Cervical cancer [facility]) We reviewed the plan of care again We discussed the role of palliative treatment We reviewed the NCCN guidelines We discussed the role of chemotherapy. The intent is of palliative intent.  We discussed some of the risks, benefits, side-effects of carboplatin & Taxol. Treatment is intravenous, every 3 weeks x 6 cycles  Some of the short term side-effects included, though not limited to, including weight loss, life threatening infections, risk of allergic reactions, need for transfusions of blood products, nausea, vomiting, change in bowel habits, loss of hair, admission to hospital for various reasons, and risks of death.   Long term side-effects are also discussed including risks of infertility, permanent damage to nerve function, hearing loss, chronic fatigue, kidney damage with possibility needing hemodialysis, and rare secondary malignancy including bone marrow disorders.  The patient is aware that the response rates discussed earlier is not guaranteed.  After a long discussion, patient made an informed decision to proceed with the prescribed plan of care.   Patient education material was dispensed. We discussed premedication with dexamethasone before chemotherapy. I do not recommend prophylactic G-CSF support We discussed the importance of getting her blood pressure under control while on bevacizumab  After the patient has left the office, I noticed that she has heavy proteinuria which is unrelated to treatment, likely due to her diabetes I will hold bevacizumab this cycle   Diabetes mellitus without complication ([place]) I will reduce premedication dexamethasone due to her diabetes history I recommend her to establish with a new primary care  doctor for chronic health issue management  Essential hypertension Her blood pressure is better since she has gained some weight She is instructed to check her blood pressure twice a day I will adjust her blood pressure as needed until she is established to see her primary care doctor  Metastasis to lung [facility]) She is not symptomatic.  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[facility]) She has gained some weight since last time I saw her I have scheduled her to see dietitian for dietary advice  Iron deficiency anemia due to chronic blood loss Her anemia has improved She does not need blood transfusion or oral iron supplement for now However, with chemotherapy, I anticipate she might need blood transfusion support I plan to see her within 10 days of chemotherapy to check her blood and transfuse as needed  Goals of care, counseling/discussion We discussed briefly about goals of care With stage IV disease, treatment goal is palliative  Other proteinuria She has heavy proteinuria could be related to her diabetes or hypertension I will hold Avastin this cycle We discussed the importance of getting her blood pressure under control over the next few weeks   No orders of the defined types were placed in this encounter.   INTERVAL HISTORY: Please see below for problem oriented charting. She returns with multiple family members for further follow-up She felt better since last time I saw her She has started eating better  and has gained some weight No worsening diabetes or peripheral neuropathy No dizziness She denies recent vaginal bleeding She has quit  smoking since last time I saw her No recent cough, chest pain or shortness of breath  SUMMARY OF ONCOLOGIC HISTORY:   Cervical cancer ([place])   12/26/2017 Initial Diagnosis    She presented to the ER with symptomatic anemia and had postmenopausal bleeding. Bimanual exam at that time revealed abnormal exam    12/26/2017 Imaging    US pelvis Limited exam due to difficulty with visualization of the uterus and endometrium.  Calcified submucosal fibroid.  Endometrium measures 10 mm. Endometrium is poorly visualized and not adequately assessed. If bleeding remains unresponsive to hormonal or medical therapy, sonohysterogram should be considered for focal lesion work-up    05/29/2018 Pathology Results    1.  Endometrium, biopsy: - Atypical squamous epithelium with dysplastic features highly suspicious for malignancy.  Note: The biopsy specimen shows squamous epithelium and marked acute inflammation with associated individual squamous cells suggestive of tumor diathesis.  These individual squamous cells show koilocytic changes with cytological features approaching high-grade dysplasia.  Rare keratin pearls are identified.  Areas of more intact squamous epithelium show koilocytic changes, dyskeratosis, and areas of increased mitotic activity (although tissue orientation precludes definitive determination of significance of this finding).  The specimen is negative for endometrial glands.    The clinical findings of a normal cervical exam and upper vaginal nodularity is noted.  Imaging findings of an abnormal uterus are also noted.  The overall findings are extremely concerning for a squamous cell carcinoma, with the differential including vaginal, uterine, or cervical in origin.  A block has been ordered for additional p16 immunohistochemical studies. If clinically indicated, additional tissue sampling may be useful for further diagnosis/classification.  An immunohistochemical stain for p16 is positive  (strong blocklike reactivity) on intact fragments of squamous epithelium as well as on detached individual squamous cells. All controls showed appropriate reactivity.   Typically primary squamous cell carcinomas of the uterus are p16-negative, therefore the current positive p16 likely reflects an HPV-driven malignancy of cervical or vaginal origin.  High grade dysplasia with individual cells seen representing an element of degeneration in this sample is also technically possible and remains in the differential, however given the clinical and imaging findings I think this is less likely.  Additional clinical and radiological correlation recommended.    07/23/2018 Imaging    CT abdomen and pelvis IMPRESSION: 1. Abnormal uterus with gas in the endometrial cavity, multiple calcified fibroids, and a right rim-enhancing uterine fluid collection measuring 3.3 x 3.5 cm concerning for abscess image 128. This is contiguous with an edematous anterior abdominal wall.  There is soft tissue extension from the uterus to the right which may represent tumor. The uterus is overall poorly defined.  2. Pelvic, retroperitoneal, and portal adenopathy 3. Lung nodules as above. CT chest may be of benefit here. 4. Coronary and aortic ASVD 5. Cholelithiasis 6. Bilateral renal cortical cyst 7. Aortoiliac atherosclerosis without aneurysm 8. Diverticulosis without diverticulitis 10. Fecal retention    09/09/2018 Pathology Results    1. 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  2. 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  3. Endometrium, curettage:  -Fragments of squamous cell carcinoma with areas of invasion, see comment.  4.  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  COMMENT: The biopsies all show fragments of squamous neoplasm ranging from squamous  cell carcinoma in situ to areas of invasive carcinoma.  The previous biopsy (case [id number]) showed strong positive p16 staining.  This, coupled with the clinical impression and the vaginal biopsy, is consistent with a vaginal/cervical origin for this tumor.  The endometrial curettage shows no evidence of endometrial tissue.    10/21/2018 PET scan    1. Extensive abnormal activity throughout the uterus, consistent with malignancy. 2. Hypermetabolic malignant-appearing retroperitoneal, external iliac, and inguinal adenopathy.  3. Numerous bilateral pulmonary nodules, most of which are below PET resolution with the largest demonstrating low-level activity. These are indeterminate, but suspicious for metastatic disease. 4. Numerous enlarged and hypermetabolic mediastinal and bilateral hilar lymph nodes, concerning for metastatic disease.  5. Small lymph node adjacent to the left thyroid lobe with low level FDG activity is nonspecific.    10/23/2018 Cancer Staging    Staging form: Cervix Uteri, AJCC 8th Edition - Clinical: FIGO Stage IVB (cT2, cN1, cM1) - Signed by Heath Lark, MD on 10/23/2018     REVIEW OF SYSTEMS:   Constitutional: Denies fevers, chills or abnormal weight loss Eyes: Denies blurriness of vision Ears, nose, mouth, throat, and face: Denies mucositis or sore throat Respiratory: Denies cough, dyspnea or wheezes Cardiovascular: Denies palpitation, chest discomfort or lower extremity swelling Gastrointestinal:  Denies nausea, heartburn or change in bowel habits Skin: Denies abnormal skin rashes Lymphatics: Denies new lymphadenopathy or easy bruising Neurological:Denies numbness, tingling or new weaknesses Behavioral/Psych: Mood is stable, no new changes  All other systems were reviewed with the patient and are negative.  I have reviewed the past medical history, past surgical history, social history and family history with the patient and they are unchanged from previous  note.  ALLERGIES:  has No Known Allergies.  MEDICATIONS:  Current Outpatient Medications  Medication Sig Dispense Refill  . amLODipine (NORVASC) 10 MG tablet Take 10 mg by mouth daily.    Marland Kitchen aspirin EC 81 MG tablet Take 81 mg by mouth daily.    Marland Kitchen atorvastatin (LIPITOR) 40 MG tablet Take 40 mg by mouth daily.    . benazepril (LOTENSIN) 40 MG tablet Take 40 mg by mouth daily.    . carvedilol (COREG) 3.125 MG tablet Take 3.125 mg by mouth 2 (two) times daily with a meal.    . dexamethasone (DECADRON) 4 MG tablet Take 2 tabs at the night before and 2 tab2 the morning of chemotherapy, every 3 weeks, by mouth with food 24 tablet 0  . glipiZIDE (GLUCOTROL XL) 10 MG 24 hr tablet Take 10 mg by mouth daily with breakfast.    . ibuprofen (ADVIL,MOTRIN) 200 MG tablet Take 200 mg by mouth daily. Pt takes with coreg because coreg gives her headaches    . lidocaine-prilocaine (EMLA) cream Apply to affected area once 30 g 3  . Liniments (SALONPAS PAIN RELIEF PATCH EX) Apply 1 patch topically daily as needed (pain).    . metFORMIN (GLUCOPHAGE) 500 MG tablet Take 1,000 mg by mouth 2 (two) times daily with a meal.    . Multiple Vitamins-Minerals (MULTIVITAMIN WITH MINERALS) tablet Take 1 tablet by mouth daily.    . ondansetron (ZOFRAN) 8 MG tablet Take 1 tablet (8 mg total) by mouth every 8 (eight) hours as needed for refractory nausea / vomiting. Start on day 3 after chemo. 30 tablet 1  . oxyCODONE-acetaminophen (PERCOCET) 7.5-325 MG tablet Take 1 tablet by mouth every 8 (eight) hours as needed  for severe pain.    Marland Kitchen prochlorperazine (COMPAZINE) 10 MG tablet Take 1 tablet (10 mg total) by mouth every 6 (six) hours as needed (Nausea or vomiting). 30 tablet 1  . vitamin B-12 (CYANOCOBALAMIN) 1000 MCG tablet Take 1,000 mcg by mouth daily.     No current facility-administered medications for this visit.     PHYSICAL EXAMINATION: ECOG PERFORMANCE STATUS: 2 - Symptomatic, <50% confined to bed  Vitals:    11/03/18 1042  BP: (!) 147/55  Pulse: 90  Resp: 18  Temp: 98 F (36.7 C)  SpO2: 97%   Filed Weights   11/03/18 1042  Weight: 132 lb 3.2 oz (60 kg)    GENERAL:alert, no distress and comfortable SKIN: skin color, texture, turgor are normal, no rashes or significant lesions EYES: normal, Conjunctiva are pink and non-injected, sclera clear OROPHARYNX:no exudate, no erythema and lips, buccal mucosa, and tongue normal  NECK: supple, thyroid normal size, non-tender, without nodularity LYMPH:  no palpable lymphadenopathy in the cervical, axillary or inguinal LUNGS: clear to auscultation and percussion with normal breathing effort HEART: regular rate & rhythm and no murmurs and no lower extremity edema ABDOMEN:abdomen soft, non-tender and normal bowel sounds Musculoskeletal:no cyanosis of digits and no clubbing  NEURO: alert & oriented x 3 with fluent speech, no focal motor/sensory deficits  LABORATORY DATA:  I have reviewed the data as listed    Component Value Date/Time   NA 133 (L) 11/03/2018 1025   K 5.1 11/03/2018 1025   CL 100 11/03/2018 1025   CO2 25 11/03/2018 1025   GLUCOSE 135 (H) 11/03/2018 1025   BUN 16 11/03/2018 1025   CREATININE 0.76 11/03/2018 1025   CALCIUM 9.6 11/03/2018 1025   PROT 8.3 (H) 11/03/2018 1025   ALBUMIN 2.9 (L) 11/03/2018 1025   AST 26 11/03/2018 1025   ALT 12 11/03/2018 1025   ALKPHOS 90 11/03/2018 1025   BILITOT 0.5 11/03/2018 1025   GFRNONAA >60 11/03/2018 1025   GFRAA >60 11/03/2018 1025    No results found for: SPEP, UPEP  Lab Results  Component Value Date   WBC 11.4 (H) 11/03/2018   NEUTROABS 9.5 (H) 11/03/2018   HGB 9.4 (L) 11/03/2018   HCT 30.8 (L) 11/03/2018   MCV 102.7 (H) 11/03/2018   PLT 416 (H) 11/03/2018      Chemistry      Component Value Date/Time   NA 133 (L) 11/03/2018 1025   K 5.1 11/03/2018 1025   CL 100 11/03/2018 1025   CO2 25 11/03/2018 1025   BUN 16 11/03/2018 1025   CREATININE 0.76 11/03/2018 1025       Component Value Date/Time   CALCIUM 9.6 11/03/2018 1025   ALKPHOS 90 11/03/2018 1025   AST 26 11/03/2018 1025   ALT 12 11/03/2018 1025   BILITOT 0.5 11/03/2018 1025       All questions were answered. The patient knows to call the clinic with any problems, questions or concerns. No barriers to learning was detected.  I spent 30 minutes counseling the patient face to face. The total time spent in the appointment was 40 minutes and more than 50% was on counseling and review of test results  Heath Lark, MD 11/03/2018 4:30 PM

## 2018-11-03 NOTE — Assessment & Plan Note (Addendum)
Her anemia has improved She does not need blood transfusion or oral iron supplement for now However, with chemotherapy, I anticipate she might need blood transfusion support I plan to see her within 10 days of chemotherapy to check her blood and transfuse as needed

## 2018-11-03 NOTE — Assessment & Plan Note (Signed)
We reviewed the plan of care again We discussed the role of palliative treatment We reviewed the NCCN guidelines We discussed the role of chemotherapy. The intent is of palliative intent.  We discussed some of the risks, benefits, side-effects of carboplatin & Taxol. Treatment is intravenous, every 3 weeks x 6 cycles  Some of the short term side-effects included, though not limited to, including weight loss, life threatening infections, risk of allergic reactions, need for transfusions of blood products, nausea, vomiting, change in bowel habits, loss of hair, admission to hospital for various reasons, and risks of death.   Long term side-effects are also discussed including risks of infertility, permanent damage to nerve function, hearing loss, chronic fatigue, kidney damage with possibility needing hemodialysis, and rare secondary malignancy including bone marrow disorders.  The patient is aware that the response rates discussed earlier is not guaranteed.  After a long discussion, patient made an informed decision to proceed with the prescribed plan of care.   Patient education material was dispensed. We discussed premedication with dexamethasone before chemotherapy. I do not recommend prophylactic G-CSF support We discussed the importance of getting her blood pressure under control while on bevacizumab  After the patient has left the office, I noticed that she has heavy proteinuria which is unrelated to treatment, likely due to her diabetes I will hold bevacizumab this cycle

## 2018-11-03 NOTE — Assessment & Plan Note (Signed)
We discussed briefly about goals of care With stage IV disease, treatment goal is palliative

## 2018-11-04 ENCOUNTER — Telehealth: Payer: Self-pay | Admitting: Oncology

## 2018-11-04 ENCOUNTER — Other Ambulatory Visit: Payer: Self-pay | Admitting: Student

## 2018-11-04 ENCOUNTER — Telehealth: Payer: Self-pay | Admitting: *Deleted

## 2018-11-04 ENCOUNTER — Telehealth: Payer: Self-pay

## 2018-11-04 NOTE — Telephone Encounter (Signed)
Spoke with dtr, [name], and informed her pt would not receive Avastin on Fri 3/20 d/t elevated urine protein.  Robin verbalizes understanding.

## 2018-11-04 NOTE — Telephone Encounter (Signed)
"  [name] calling about my aunt.  Does she take mediations in the morning before port?  She can't eat tonight."  Patient Katie Myers reports to this nurse "I forgot to ask Dr. Alvy Bimler yesterday which medications to take tomorrow morning.  Usually take B/P and diabetic medications in the mornings."  Conveyed to [name] her morning medications may be taken with a sip of water.

## 2018-11-04 NOTE — Telephone Encounter (Signed)
Called Robin, patient's daughter, and reviewed instructions for the port placement tomorrow: arrive at 7 am at [facility], [place] after midnight and needs a driver.  She verbalized agreement and understanding.

## 2018-11-05 ENCOUNTER — Encounter: Payer: Self-pay | Admitting: *Deleted

## 2018-11-05 ENCOUNTER — Encounter (HOSPITAL_COMMUNITY): Payer: Self-pay

## 2018-11-05 ENCOUNTER — Other Ambulatory Visit: Payer: Self-pay

## 2018-11-05 ENCOUNTER — Ambulatory Visit (HOSPITAL_COMMUNITY)
Admission: RE | Admit: 2018-11-05 | Discharge: 2018-11-05 | Disposition: A | Discharge status: Home / Self Care | Payer: Medicare HMO | Source: Ambulatory Visit | Attending: Hematology and Oncology | Admitting: Hematology and Oncology

## 2018-11-05 DIAGNOSIS — Z79899 Other long term (current) drug therapy: Secondary | ICD-10-CM | POA: Insufficient documentation

## 2018-11-05 DIAGNOSIS — Z809 Family history of malignant neoplasm, unspecified: Secondary | ICD-10-CM | POA: Insufficient documentation

## 2018-11-05 DIAGNOSIS — Z803 Family history of malignant neoplasm of breast: Secondary | ICD-10-CM | POA: Insufficient documentation

## 2018-11-05 DIAGNOSIS — Z7984 Long term (current) use of oral hypoglycemic drugs: Secondary | ICD-10-CM | POA: Insufficient documentation

## 2018-11-05 DIAGNOSIS — Z7982 Long term (current) use of aspirin: Secondary | ICD-10-CM | POA: Insufficient documentation

## 2018-11-05 DIAGNOSIS — F17210 Nicotine dependence, cigarettes, uncomplicated: Secondary | ICD-10-CM | POA: Insufficient documentation

## 2018-11-05 DIAGNOSIS — Z801 Family history of malignant neoplasm of trachea, bronchus and lung: Secondary | ICD-10-CM | POA: Insufficient documentation

## 2018-11-05 DIAGNOSIS — C539 Malignant neoplasm of cervix uteri, unspecified: Secondary | ICD-10-CM | POA: Diagnosis not present

## 2018-11-05 DIAGNOSIS — Z8673 Personal history of transient ischemic attack (TIA), and cerebral infarction without residual deficits: Secondary | ICD-10-CM | POA: Insufficient documentation

## 2018-11-05 DIAGNOSIS — E119 Type 2 diabetes mellitus without complications: Secondary | ICD-10-CM | POA: Insufficient documentation

## 2018-11-05 DIAGNOSIS — I10 Essential (primary) hypertension: Secondary | ICD-10-CM | POA: Diagnosis not present

## 2018-11-05 HISTORY — PX: IR IMAGING GUIDED PORT INSERTION: IMG5740

## 2018-11-05 LAB — PROTIME-INR
INR: 1 (ref 0.8–1.2)
Prothrombin Time: 13.3 seconds (ref 11.4–15.2)

## 2018-11-05 LAB — CBC
HCT: 30.6 % — ABNORMAL LOW (ref 36.0–46.0)
Hemoglobin: 9.4 g/dL — ABNORMAL LOW (ref 12.0–15.0)
MCH: 30.7 pg (ref 26.0–34.0)
MCHC: 30.7 g/dL (ref 30.0–36.0)
MCV: 100 fL (ref 80.0–100.0)
Platelets: 450 10*3/uL — ABNORMAL HIGH (ref 150–400)
RBC: 3.06 MIL/uL — ABNORMAL LOW (ref 3.87–5.11)
RDW: 14.1 % (ref 11.5–15.5)
WBC: 4.8 10*3/uL (ref 4.0–10.5)
nRBC: 0 % (ref 0.0–0.2)

## 2018-11-05 LAB — GLUCOSE, CAPILLARY: Glucose-Capillary: 237 mg/dL — ABNORMAL HIGH (ref 70–99)

## 2018-11-05 LAB — APTT: aPTT: 32 seconds (ref 24–36)

## 2018-11-05 MED ORDER — FENTANYL CITRATE (PF) 100 MCG/2ML IJ SOLN
INTRAMUSCULAR | Status: AC
  Filled 2018-11-05: qty 2

## 2018-11-05 MED ORDER — HEPARIN SOD (PORK) LOCK FLUSH 100 UNIT/ML IV SOLN
INTRAVENOUS | Status: AC
  Filled 2018-11-05: qty 5

## 2018-11-05 MED ORDER — SODIUM CHLORIDE 0.9 % IV SOLN: INTRAVENOUS | Status: DC

## 2018-11-05 MED ORDER — MIDAZOLAM HCL 2 MG/2ML IJ SOLN
INTRAMUSCULAR | Status: AC | PRN
  Administered 2018-11-05: 1 mg via INTRAVENOUS

## 2018-11-05 MED ORDER — MIDAZOLAM HCL 2 MG/2ML IJ SOLN
INTRAMUSCULAR | Status: AC
  Filled 2018-11-05: qty 2

## 2018-11-05 MED ORDER — LIDOCAINE HCL (PF) 1 % IJ SOLN
INTRAMUSCULAR | Status: AC
  Filled 2018-11-05: qty 30

## 2018-11-05 MED ORDER — FENTANYL CITRATE (PF) 100 MCG/2ML IJ SOLN
INTRAMUSCULAR | Status: AC | PRN
  Administered 2018-11-05: 50 ug via INTRAVENOUS

## 2018-11-05 MED ORDER — LIDOCAINE HCL (PF) 1 % IJ SOLN
INTRAMUSCULAR | Status: AC | PRN
  Administered 2018-11-05: 10 mL

## 2018-11-05 MED ORDER — CEFAZOLIN SODIUM-DEXTROSE 2-4 GM/100ML-% IV SOLN
INTRAVENOUS | Status: AC
  Filled 2018-11-05: qty 100

## 2018-11-05 MED ORDER — CEFAZOLIN SODIUM-DEXTROSE 2-4 GM/100ML-% IV SOLN
2.0000 g | Freq: Once | INTRAVENOUS | Status: AC
  Administered 2018-11-05: 2 g via INTRAVENOUS

## 2018-11-05 NOTE — Procedures (Signed)
Interventional Radiology Procedure Note  Procedure: Placement of a right IJ approach single lumen PowerPort.  Tip is positioned at the superior cavoatrial junction and catheter is ready for immediate use.  Complications: No immediate Recommendations:  - Ok to shower tomorrow - Do not submerge for 7 days - Routine line care   Signed,  [place] K. Kealy Lewter, MD   

## 2018-11-05 NOTE — H&P (Signed)
Chief Complaint: Patient was seen in consultation today for port placement.  Referring Physician(s): Heath Lark  Supervising Physician: Jacqulynn Cadet  Patient Status: [facility] - Out-pt  History of Present Illness: Katie Myers is a 73 y.o. female with a past medical history significant for HTN, DM, CVA and cervical cancer followed by Dr. Alvy Bimler who presents today for port placement. Patient was originally diagnosed with cervical cancer in 2019 and after further workup was noted to have widespread metastatic disease. Per chart she is planning to undergo palliative chemotherapy to begin on Friday per patient's niece.   Past Medical History:  Diagnosis Date  . Cancer (HCC)    cervical cancer  . Diabetes mellitus without complication ([place])   . Hypertension   . Stroke [facility])     History reviewed. No pertinent surgical history.  Allergies: Patient has no known allergies.  Medications: Prior to Admission medications   Medication Sig Start Date End Date Taking? Authorizing Provider  amLODipine (NORVASC) 10 MG tablet Take 10 mg by mouth daily.   Yes [provider]  aspirin EC 81 MG tablet Take 81 mg by mouth daily.   Yes [provider]  atorvastatin (LIPITOR) 40 MG tablet Take 40 mg by mouth daily.   Yes [provider]  benazepril (LOTENSIN) 40 MG tablet Take 40 mg by mouth daily.   Yes [provider]  carvedilol (COREG) 3.125 MG tablet Take 3.125 mg by mouth 2 (two) times daily with a meal.   Yes [provider]  glipiZIDE (GLUCOTROL XL) 10 MG 24 hr tablet Take 10 mg by mouth daily with breakfast.   Yes [provider]  ibuprofen (ADVIL,MOTRIN) 200 MG tablet Take 200 mg by mouth daily. Pt takes with coreg because coreg gives her headaches   Yes [provider]  metFORMIN (GLUCOPHAGE) 500 MG tablet Take 1,000 mg by mouth 2 (two) times daily with a meal.   Yes [provider]  Multiple Vitamins-Minerals  (MULTIVITAMIN WITH MINERALS) tablet Take 1 tablet by mouth daily.   Yes [provider]  vitamin B-12 (CYANOCOBALAMIN) 1000 MCG tablet Take 1,000 mcg by mouth daily.   Yes [provider]  dexamethasone (DECADRON) 4 MG tablet Take 2 tabs at the night before and 2 tab2 the morning of chemotherapy, every 3 weeks, by mouth with food 11/03/18   Heath Lark, MD  lidocaine-prilocaine (EMLA) cream Apply to affected area once 11/03/18   Heath Lark, MD  Liniments (SALONPAS PAIN RELIEF PATCH EX) Apply 1 patch topically daily as needed (pain).    [provider]  ondansetron (ZOFRAN) 8 MG tablet Take 1 tablet (8 mg total) by mouth every 8 (eight) hours as needed for refractory nausea / vomiting. Start on day 3 after chemo. 11/03/18   Heath Lark, MD  oxyCODONE-acetaminophen (PERCOCET) 7.5-325 MG tablet Take 1 tablet by mouth every 8 (eight) hours as needed for severe pain.    [provider]  prochlorperazine (COMPAZINE) 10 MG tablet Take 1 tablet (10 mg total) by mouth every 6 (six) hours as needed (Nausea or vomiting). 11/03/18   Heath Lark, MD     Family History  Problem Relation Age of Onset  . Cancer Mother        unknown cancer  . Cancer Maternal Aunt        breast ca  . Cancer Maternal Uncle        lung ca  . Cancer Maternal Grandfather  unknown cancer    Social History   Socioeconomic History  . Marital status: Widowed    Spouse name: Not on file  . Number of children: 1  . Years of education: Not on file  . Highest education level: Not on file  Occupational History  . Occupation: retired  Scientific laboratory technician  . Financial resource strain: Not on file  . Food insecurity:    Worry: Not on file    Inability: Not on file  . Transportation needs:    Medical: Not on file    Non-medical: Not on file  Tobacco Use  . Smoking status: Current Every Day Smoker    Packs/day: 0.50    Types: Cigarettes  . Smokeless tobacco: Never Used  . Tobacco comment:  last smoke week 3/10  Substance and Sexual Activity  . Alcohol use: Never    Frequency: Never  . Drug use: Never  . Sexual activity: Not on file  Lifestyle  . Physical activity:    Days per week: Not on file    Minutes per session: Not on file  . Stress: Not on file  Relationships  . Social connections:    Talks on phone: Not on file    Gets together: Not on file    Attends religious service: Not on file    Active member of club or organization: Not on file    Attends meetings of clubs or organizations: Not on file    Relationship status: Not on file  Other Topics Concern  . Not on file  Social History Narrative  . Not on file     Review of Systems: A 12 point ROS discussed and pertinent positives are indicated in the HPI above.  All other systems are negative.  Review of Systems  Constitutional: Positive for unexpected weight change. Negative for chills and fever.  Respiratory: Negative for cough and shortness of breath.   Cardiovascular: Negative for chest pain.  Gastrointestinal: Negative for abdominal pain, diarrhea, nausea and vomiting.  Musculoskeletal: Negative for back pain.  Skin: Negative for color change.  Neurological: Negative for dizziness, syncope and headaches.  Psychiatric/Behavioral: Negative for confusion.    Vital Signs: BP 137/65   Pulse 81   Temp 97.9 F (36.6 C) (Oral)   Resp 16   Ht 5\' 5"  (1.651 m)   Wt 135 lb (61.2 kg)   SpO2 99%   BMI 22.47 kg/m   Physical Exam Vitals signs reviewed.  Constitutional:      General: She is not in acute distress.    Comments: Niece and daughter at bedside during exam.  HENT:     Head: Normocephalic.  Cardiovascular:     Rate and Rhythm: Normal rate and regular rhythm.  Pulmonary:     Effort: Pulmonary effort is normal.     Breath sounds: Normal breath sounds.  Abdominal:     General: There is no distension.     Palpations: Abdomen is soft.     Tenderness: There is no abdominal tenderness.   Skin:    General: Skin is warm and dry.  Neurological:     Mental Status: She is alert and oriented to person, place, and time.  Psychiatric:        Mood and Affect: Mood normal.        Behavior: Behavior normal.        Thought Content: Thought content normal.        Judgment: Judgment normal.      MD  Evaluation Airway: WNL(top and bottom dentures) Heart: WNL Abdomen: WNL Chest/ Lungs: WNL ASA  Classification: 3 Mallampati/Airway Score: One   Imaging: No results found.  Labs:  CBC: Recent Labs    12/26/17 1055 11/03/18 1025 11/05/18 0718  WBC 16.4* 11.4* 4.8  HGB 8.8* 9.4* 9.4*  HCT 28.7* 30.8* 30.6*  PLT 486* 416* 450*    COAGS: Recent Labs    11/05/18 0718  INR 1.0  APTT 32    BMP: Recent Labs    12/26/17 1055 11/03/18 1025  NA 133* 133*  K 4.5 5.1  CL 98* 100  CO2 23 25  GLUCOSE 170* 135*  BUN 15 16  CALCIUM 9.5 9.6  CREATININE 0.67 0.76  GFRNONAA >60 >60  GFRAA >60 >60    LIVER FUNCTION TESTS: Recent Labs    12/26/17 1055 11/03/18 1025  BILITOT 1.0 0.5  AST 20 26  ALT 10* 12  ALKPHOS 93 90  PROT 9.2* 8.3*  ALBUMIN 3.5 2.9*    TUMOR MARKERS: No results for input(s): AFPTM, CEA, CA199, CHROMGRNA in the last 8760 hours.  Assessment and Plan:  73 y/o F with stage IV cervical cancer followed by Dr. Alvy Bimler planned for palliative chemotherapy who presents today for port placement.  Patient has been NPO since 8 pm last night, last dose of Plavix 3/9. Afebrile, WBC, hgb 9.4, plt 450, INR 1.0.  Risks and benefits of image guided port-a-catheter placement was discussed with the patient including, but not limited to bleeding, infection, pneumothorax, or fibrin sheath development and need for additional procedures.  All of the patient's questions were answered, patient is agreeable to proceed.  Consent signed and in chart.  Thank you for this interesting consult.  I greatly enjoyed meeting Katie Myers and look forward to  participating in their care.  A copy of this report was sent to the requesting provider on this date.  Electronically Signed: Joaquim Nam, PA-C 11/05/2018, 8:12 AM   I spent a total of 30 Minutesin face to face in clinical consultation, greater than 50% of which was counseling/coordinating care for port placement.

## 2018-11-05 NOTE — Discharge Instructions (Signed)
Implanted Port Insertion, Care After °This sheet gives you information about how to care for yourself after your procedure. Your health care provider may also give you more specific instructions. If you have problems or questions, contact your health care provider. °What can I expect after the procedure? °After the procedure, it is common to have: °· Discomfort at the port insertion site. °· Bruising on the skin over the port. This should improve over 3-4 days. °Follow these instructions at home: °Port care °· After your port is placed, you will get a manufacturer's information card. The card has information about your port. Keep this card with you at all times. °· Take care of the port as told by your health care provider. Ask your health care provider if you or a family member can get training for taking care of the port at home. A home health care nurse may also take care of the port. °· Make sure to remember what type of port you have. °Incision care ° °  ° °· Follow instructions from your health care provider about how to take care of your port insertion site. Make sure you: °? Wash your hands with soap and water before and after you change your bandage (dressing). If soap and water are not available, use hand sanitizer. °? Change your dressing as told by your health care provider. °? Leave stitches (sutures), skin glue, or adhesive strips in place. These skin closures may need to stay in place for 2 weeks or longer. If adhesive strip edges start to loosen and curl up, you may trim the loose edges. Do not remove adhesive strips completely unless your health care provider tells you to do that. °· Check your port insertion site every day for signs of infection. Check for: °? Redness, swelling, or pain. °? Fluid or blood. °? Warmth. °? Pus or a bad smell. °Activity °· Return to your normal activities as told by your health care provider. Ask your health care provider what activities are safe for you. °· Do not  lift anything that is heavier than 10 lb (4.5 kg), or the limit that you are told, until your health care provider says that it is safe. °General instructions °· Take over-the-counter and prescription medicines only as told by your health care provider. °· Do not take baths, swim, or use a hot tub until your health care provider approves. Ask your health care provider if you may take showers. You may only be allowed to take sponge baths. °· Do not drive for 24 hours if you were given a sedative during your procedure. °· Wear a medical alert bracelet in case of an emergency. This will tell any health care providers that you have a port. °· Keep all follow-up visits as told by your health care provider. This is important. °Contact a health care provider if: °· You cannot flush your port with saline as directed, or you cannot draw blood from the port. °· You have a fever or chills. °· You have redness, swelling, or pain around your port insertion site. °· You have fluid or blood coming from your port insertion site. °· Your port insertion site feels warm to the touch. °· You have pus or a bad smell coming from the port insertion site. °Get help right away if: °· You have chest pain or shortness of breath. °· You have bleeding from your port that you cannot control. °Summary °· Take care of the port as told by your health   care provider. Keep the manufacturer's information card with you at all times. °· Change your dressing as told by your health care provider. °· Contact a health care provider if you have a fever or chills or if you have redness, swelling, or pain around your port insertion site. °· Keep all follow-up visits as told by your health care provider. °This information is not intended to replace advice given to you by your health care provider. Make sure you discuss any questions you have with your health care provider. °Document Released: 05/27/2013 Document Revised: 03/04/2018 Document Reviewed:  03/04/2018 °Elsevier Interactive Patient Education © 2019 [employer] ° °Moderate Conscious Sedation, Adult, Care After °These instructions provide you with information about caring for yourself after your procedure. Your health care provider may also give you more specific instructions. Your treatment has been planned according to current medical practices, but problems sometimes occur. Call your health care provider if you have any problems or questions after your procedure. °What can I expect after the procedure? °After your procedure, it is common: °· To feel sleepy for several hours. °· To feel clumsy and have poor balance for several hours. °· To have poor judgment for several hours. °· To vomit if you eat too soon. °Follow these instructions at home: °For at least 24 hours after the procedure: ° °· Do not: °? Participate in activities where you could fall or become injured. °? Drive. °? Use heavy machinery. °? Drink alcohol. °? Take sleeping pills or medicines that cause drowsiness. °? Make important decisions or sign legal documents. °? Take care of children on your own. °· Rest. °Eating and drinking °· Follow the diet recommended by your health care provider. °· If you vomit: °? Drink water, juice, or soup when you can drink without vomiting. °? Make sure you have little or no nausea before eating solid foods. °General instructions °· Have a responsible adult stay with you until you are awake and alert. °· Take over-the-counter and prescription medicines only as told by your health care provider. °· If you smoke, do not smoke without supervision. °· Keep all follow-up visits as told by your health care provider. This is important. °Contact a health care provider if: °· You keep feeling nauseous or you keep vomiting. °· You feel light-headed. °· You develop a rash. °· You have a fever. °Get help right away if: °· You have trouble breathing. °This information is not intended to replace advice given to you  by your health care provider. Make sure you discuss any questions you have with your health care provider. °Document Released: 05/27/2013 Document Revised: 01/09/2016 Document Reviewed: 11/26/2015 °Elsevier Interactive Patient Education © 2019 [employer] ° °

## 2018-11-06 ENCOUNTER — Telehealth: Payer: Self-pay | Admitting: Oncology

## 2018-11-06 NOTE — Telephone Encounter (Signed)
Called Robin back and advised her of message from Dr. Alvy Bimler.  She verbalized understanding.  She also mentioned that they were just called about PT.  The office will be closed for 2 weeks and will call back to reschedule when they reopen.

## 2018-11-06 NOTE — Telephone Encounter (Signed)
Patient's daughter called and asked when Katie Myers can restart taking Plavix.  She had her port placed yesterday morning.

## 2018-11-06 NOTE — Telephone Encounter (Signed)
If no signs of bleeding ok to resume today

## 2018-11-07 ENCOUNTER — Inpatient Hospital Stay: Payer: Medicare HMO

## 2018-11-07 ENCOUNTER — Encounter: Payer: Self-pay | Admitting: Oncology

## 2018-11-07 ENCOUNTER — Other Ambulatory Visit: Payer: Self-pay | Admitting: Hematology and Oncology

## 2018-11-07 ENCOUNTER — Other Ambulatory Visit: Payer: Self-pay

## 2018-11-07 VITALS — BP 131/58 | HR 86 | Temp 98.4°F | Resp 17

## 2018-11-07 DIAGNOSIS — Z7189 Other specified counseling: Secondary | ICD-10-CM

## 2018-11-07 DIAGNOSIS — Z5111 Encounter for antineoplastic chemotherapy: Secondary | ICD-10-CM | POA: Diagnosis not present

## 2018-11-07 DIAGNOSIS — C539 Malignant neoplasm of cervix uteri, unspecified: Secondary | ICD-10-CM

## 2018-11-07 MED ORDER — SODIUM CHLORIDE 0.9 % IV SOLN
20.0000 mg | Freq: Once | INTRAVENOUS | Status: AC
  Administered 2018-11-07: 20 mg via INTRAVENOUS
  Filled 2018-11-07: qty 2

## 2018-11-07 MED ORDER — DIPHENHYDRAMINE HCL 50 MG/ML IJ SOLN
INTRAMUSCULAR | Status: AC
  Filled 2018-11-07: qty 1

## 2018-11-07 MED ORDER — SODIUM CHLORIDE 0.9 % IV SOLN
358.5000 mg | Freq: Once | INTRAVENOUS | Status: AC
  Administered 2018-11-07: 360 mg via INTRAVENOUS
  Filled 2018-11-07: qty 36

## 2018-11-07 MED ORDER — SODIUM CHLORIDE 0.9% FLUSH
10.0000 mL | INTRAVENOUS | Status: DC | PRN
  Administered 2018-11-07: 10 mL
  Filled 2018-11-07: qty 10

## 2018-11-07 MED ORDER — HEPARIN SOD (PORK) LOCK FLUSH 100 UNIT/ML IV SOLN
500.0000 [IU] | Freq: Once | INTRAVENOUS | Status: AC | PRN
  Administered 2018-11-07: 500 [IU]
  Filled 2018-11-07: qty 5

## 2018-11-07 MED ORDER — DIPHENHYDRAMINE HCL 50 MG/ML IJ SOLN
50.0000 mg | Freq: Once | INTRAMUSCULAR | Status: AC
  Administered 2018-11-07: 50 mg via INTRAVENOUS

## 2018-11-07 MED ORDER — SODIUM CHLORIDE 0.9 % IV SOLN
Freq: Once | INTRAVENOUS | Status: AC
  Administered 2018-11-07: 08:00:00 via INTRAVENOUS
  Filled 2018-11-07: qty 250

## 2018-11-07 MED ORDER — FAMOTIDINE IN NACL 20-0.9 MG/50ML-% IV SOLN: 20.0000 mg | Freq: Once | INTRAVENOUS | Status: DC

## 2018-11-07 MED ORDER — SODIUM CHLORIDE 0.9 % IV SOLN
20.0000 mg | Freq: Once | INTRAVENOUS | Status: AC
  Administered 2018-11-07: 20 mg via INTRAVENOUS
  Filled 2018-11-07: qty 20

## 2018-11-07 MED ORDER — PALONOSETRON HCL INJECTION 0.25 MG/5ML
0.2500 mg | Freq: Once | INTRAVENOUS | Status: AC
  Administered 2018-11-07: 0.25 mg via INTRAVENOUS

## 2018-11-07 MED ORDER — PALONOSETRON HCL INJECTION 0.25 MG/5ML
INTRAVENOUS | Status: AC
  Filled 2018-11-07: qty 5

## 2018-11-07 MED ORDER — SODIUM CHLORIDE 0.9 % IV SOLN
140.0000 mg/m2 | Freq: Once | INTRAVENOUS | Status: AC
  Administered 2018-11-07: 228 mg via INTRAVENOUS
  Filled 2018-11-07: qty 38

## 2018-11-07 NOTE — Patient Instructions (Signed)
Coronavirus (COVID-19) Are you at risk?  Are you at risk for the Coronavirus (COVID-19)?  To be considered HIGH RISK for Coronavirus (COVID-19), you have to meet the following criteria:  . Traveled to [place], [place], [place], [place] or [place]; or in the [place] to [place], [place], [place], or [place]; and have fever, cough, and shortness of breath within the last 2 weeks of travel OR . Been in close contact with a person diagnosed with COVID-19 within the last 2 weeks and have fever, cough, and shortness of breath . IF YOU DO NOT MEET THESE CRITERIA, YOU ARE CONSIDERED LOW RISK FOR COVID-19.  What to do if you are HIGH RISK for COVID-19?  . If you are having a medical emergency, call 911. . Seek medical care right away. Before you go to a doctor's office, urgent care or emergency department, call ahead and tell them about your recent travel, contact with someone diagnosed with COVID-19, and your symptoms. You should receive instructions from your physician's office regarding next steps of care.  . When you arrive at healthcare provider, tell the healthcare staff immediately you have returned from visiting [place], [place], [place], [place] or [place]; or traveled in the [place] to [place], [place], [place], or [place]; in the last two weeks or you have been in close contact with a person diagnosed with COVID-19 in the last 2 weeks.   . Tell the health care staff about your symptoms: fever, cough and shortness of breath. . After you have been seen by a medical provider, you will be either: o Tested for (COVID-19) and discharged home on quarantine except to seek medical care if symptoms worsen, and asked to  - Stay home and avoid contact with others until you get your results (4-5 days)  - Avoid travel on public transportation if possible (such as bus, train, or airplane) or o Sent to the Emergency Department by EMS for evaluation, COVID-19 testing, and possible  admission depending on your condition and test results.  What to do if you are LOW RISK for COVID-19?  Reduce your risk of any infection by using the same precautions used for avoiding the common cold or flu:  . Wash your hands often with soap and warm water for at least 20 seconds.  If soap and water are not readily available, use an alcohol-based hand sanitizer with at least 60% alcohol.  . If coughing or sneezing, cover your mouth and nose by coughing or sneezing into the elbow areas of your shirt or coat, into a tissue or into your sleeve (not your hands). . Avoid shaking hands with others and consider head nods or verbal greetings only. . Avoid touching your eyes, nose, or mouth with unwashed hands.  . Avoid close contact with people who are sick. . Avoid places or events with large numbers of people in one location, like concerts or sporting events. . Carefully consider travel plans you have or are making. . If you are planning any travel outside or inside the [place], visit the CDC's Travelers' Health webpage for the latest health notices. . If you have some symptoms but not all symptoms, continue to monitor at home and seek medical attention if your symptoms worsen. . If you are having a medical emergency, call 911.   ADDITIONAL HEALTHCARE OPTIONS FOR PATIENTS  [facility] / e-Visit: [email or web address]         [facility]: [phone]  Onset   Urgent Care: [facility]: [place] Discharge Instructions for Patients Receiving Chemotherapy  Today you received the following chemotherapy agents: Paclitaxel (Taxol) and Carboplatin (Paraplatin)  To help prevent nausea and vomiting after your treatment, we encourage you to take your nausea medication as directed. Received Aloxi during treatment today-->Take your Compazine prescription (not your  Zofran prescription) for the next 3 days as needed.    If you develop nausea and vomiting that is not controlled by your nausea medication, call the clinic.   BELOW ARE SYMPTOMS THAT SHOULD BE REPORTED IMMEDIATELY:  *FEVER GREATER THAN 100.5 F  *CHILLS WITH OR WITHOUT FEVER  NAUSEA AND VOMITING THAT IS NOT CONTROLLED WITH YOUR NAUSEA MEDICATION  *UNUSUAL SHORTNESS OF BREATH  *UNUSUAL BRUISING OR BLEEDING  TENDERNESS IN MOUTH AND THROAT WITH OR WITHOUT PRESENCE OF ULCERS  *URINARY PROBLEMS  *BOWEL PROBLEMS  UNUSUAL RASH Items with * indicate a potential emergency and should be followed up as soon as possible.  Feel free to call the clinic should you have any questions or concerns. The clinic phone number is [phone].  Please show the [facility] at check-in to the Emergency Department and triage nurse.  Paclitaxel injection What is this medicine? PACLITAXEL (PAK li TAX el) is a chemotherapy drug. It targets fast dividing cells, like cancer cells, and causes these cells to die. This medicine is used to treat ovarian cancer, breast cancer, lung cancer, Kaposi's sarcoma, and other cancers. This medicine may be used for other purposes; ask your health care provider or pharmacist if you have questions. COMMON BRAND NAME(S): Onxol, Taxol What should I tell my health care provider before I take this medicine? They need to know if you have any of these conditions: -history of irregular heartbeat -liver disease -low blood counts, like low white cell, platelet, or red cell counts -lung or breathing disease, like asthma -tingling of the fingers or toes, or other nerve disorder -an unusual or allergic reaction to paclitaxel, alcohol, polyoxyethylated castor oil, other chemotherapy, other medicines, foods, dyes, or preservatives -pregnant or trying to get pregnant -breast-feeding How should I use this medicine? This drug is given as an infusion into a vein. It is  administered in a hospital or clinic by a specially trained health care professional. Talk to your pediatrician regarding the use of this medicine in children. Special care may be needed. Overdosage: If you think you have taken too much of this medicine contact a poison control center or emergency room at once. NOTE: This medicine is only for you. Do not share this medicine with others. What if I miss a dose? It is important not to miss your dose. Call your doctor or health care professional if you are unable to keep an appointment. What may interact with this medicine? Do not take this medicine with any of the following medications: -disulfiram -metronidazole This medicine may also interact with the following medications: -antiviral medicines for hepatitis, HIV or AIDS -certain antibiotics like erythromycin and clarithromycin -certain medicines for fungal infections like ketoconazole and itraconazole -certain medicines for seizures like carbamazepine, phenobarbital, phenytoin -gemfibrozil -nefazodone -rifampin -St. John's wort This list may not describe all possible interactions. Give your health care provider a list of all the medicines, herbs, non-prescription drugs, or dietary supplements you use. Also tell them if you smoke, drink alcohol, or use illegal  drugs. Some items may interact with your medicine. What should I watch for while using this medicine? Your condition will be monitored carefully while you are receiving this medicine. You will need important blood work done while you are taking this medicine. This medicine can cause serious allergic reactions. To reduce your risk you will need to take other medicine(s) before treatment with this medicine. If you experience allergic reactions like skin rash, itching or hives, swelling of the face, lips, or tongue, tell your doctor or health care professional right away. In some cases, you may be given additional medicines to help with side  effects. Follow all directions for their use. This drug may make you feel generally unwell. This is not uncommon, as chemotherapy can affect healthy cells as well as cancer cells. Report any side effects. Continue your course of treatment even though you feel ill unless your doctor tells you to stop. Call your doctor or health care professional for advice if you get a fever, chills or sore throat, or other symptoms of a cold or flu. Do not treat yourself. This drug decreases your body's ability to fight infections. Try to avoid being around people who are sick. This medicine may increase your risk to bruise or bleed. Call your doctor or health care professional if you notice any unusual bleeding. Be careful brushing and flossing your teeth or using a toothpick because you may get an infection or bleed more easily. If you have any dental work done, tell your dentist you are receiving this medicine. Avoid taking products that contain aspirin, acetaminophen, ibuprofen, naproxen, or ketoprofen unless instructed by your doctor. These medicines may hide a fever. Do not become pregnant while taking this medicine. Women should inform their doctor if they wish to become pregnant or think they might be pregnant. There is a potential for serious side effects to an unborn child. Talk to your health care professional or pharmacist for more information. Do not breast-feed an infant while taking this medicine. Men are advised not to father a child while receiving this medicine. This product may contain alcohol. Ask your pharmacist or healthcare provider if this medicine contains alcohol. Be sure to tell all healthcare providers you are taking this medicine. Certain medicines, like metronidazole and disulfiram, can cause an unpleasant reaction when taken with alcohol. The reaction includes flushing, headache, nausea, vomiting, sweating, and increased thirst. The reaction can last from 30 minutes to several hours. What side  effects may I notice from receiving this medicine? Side effects that you should report to your doctor or health care professional as soon as possible: -allergic reactions like skin rash, itching or hives, swelling of the face, lips, or tongue -breathing problems -changes in vision -fast, irregular heartbeat -high or low blood pressure -mouth sores -pain, tingling, numbness in the hands or feet -signs of decreased platelets or bleeding - bruising, pinpoint red spots on the skin, black, tarry stools, blood in the urine -signs of decreased red blood cells - unusually weak or tired, feeling faint or lightheaded, falls -signs of infection - fever or chills, cough, sore throat, pain or difficulty passing urine -signs and symptoms of liver injury like dark yellow or brown urine; general ill feeling or flu-like symptoms; light-colored stools; loss of appetite; nausea; right upper belly pain; unusually weak or tired; yellowing of the eyes or skin -swelling of the ankles, feet, hands -unusually slow heartbeat Side effects that usually do not require medical attention (report to your doctor or health care  professional if they continue or are bothersome): -diarrhea -hair loss -loss of appetite -muscle or joint pain -nausea, vomiting -pain, redness, or irritation at site where injected -tiredness This list may not describe all possible side effects. Call your doctor for medical advice about side effects. You may report side effects to FDA at 1-800-FDA-1088. Where should I keep my medicine? This drug is given in a hospital or clinic and will not be stored at home. NOTE: This sheet is a summary. It may not cover all possible information. If you have questions about this medicine, talk to your doctor, pharmacist, or health care provider.  2019 Elsevier/Gold Standard (2017-04-09 13:14:55)  Carboplatin injection What is this medicine? CARBOPLATIN (KAR boe pla tin) is a chemotherapy drug. It targets  fast dividing cells, like cancer cells, and causes these cells to die. This medicine is used to treat ovarian cancer and many other cancers. This medicine may be used for other purposes; ask your health care provider or pharmacist if you have questions. COMMON BRAND NAME(S): Paraplatin What should I tell my health care provider before I take this medicine? They need to know if you have any of these conditions: -blood disorders -hearing problems -kidney disease -recent or ongoing radiation therapy -an unusual or allergic reaction to carboplatin, cisplatin, other chemotherapy, other medicines, foods, dyes, or preservatives -pregnant or trying to get pregnant -breast-feeding How should I use this medicine? This drug is usually given as an infusion into a vein. It is administered in a hospital or clinic by a specially trained health care professional. Talk to your pediatrician regarding the use of this medicine in children. Special care may be needed. Overdosage: If you think you have taken too much of this medicine contact a poison control center or emergency room at once. NOTE: This medicine is only for you. Do not share this medicine with others. What if I miss a dose? It is important not to miss a dose. Call your doctor or health care professional if you are unable to keep an appointment. What may interact with this medicine? -medicines for seizures -medicines to increase blood counts like filgrastim, pegfilgrastim, sargramostim -some antibiotics like amikacin, gentamicin, neomycin, streptomycin, tobramycin -vaccines Talk to your doctor or health care professional before taking any of these medicines: -acetaminophen -aspirin -ibuprofen -ketoprofen -naproxen This list may not describe all possible interactions. Give your health care provider a list of all the medicines, herbs, non-prescription drugs, or dietary supplements you use. Also tell them if you smoke, drink alcohol, or use  illegal drugs. Some items may interact with your medicine. What should I watch for while using this medicine? Your condition will be monitored carefully while you are receiving this medicine. You will need important blood work done while you are taking this medicine. This drug may make you feel generally unwell. This is not uncommon, as chemotherapy can affect healthy cells as well as cancer cells. Report any side effects. Continue your course of treatment even though you feel ill unless your doctor tells you to stop. In some cases, you may be given additional medicines to help with side effects. Follow all directions for their use. Call your doctor or health care professional for advice if you get a fever, chills or sore throat, or other symptoms of a cold or flu. Do not treat yourself. This drug decreases your body's ability to fight infections. Try to avoid being around people who are sick. This medicine may increase your risk to bruise or bleed. Call  your doctor or health care professional if you notice any unusual bleeding. Be careful brushing and flossing your teeth or using a toothpick because you may get an infection or bleed more easily. If you have any dental work done, tell your dentist you are receiving this medicine. Avoid taking products that contain aspirin, acetaminophen, ibuprofen, naproxen, or ketoprofen unless instructed by your doctor. These medicines may hide a fever. Do not become pregnant while taking this medicine. Women should inform their doctor if they wish to become pregnant or think they might be pregnant. There is a potential for serious side effects to an unborn child. Talk to your health care professional or pharmacist for more information. Do not breast-feed an infant while taking this medicine. What side effects may I notice from receiving this medicine? Side effects that you should report to your doctor or health care professional as soon as possible: -allergic  reactions like skin rash, itching or hives, swelling of the face, lips, or tongue -signs of infection - fever or chills, cough, sore throat, pain or difficulty passing urine -signs of decreased platelets or bleeding - bruising, pinpoint red spots on the skin, black, tarry stools, nosebleeds -signs of decreased red blood cells - unusually weak or tired, fainting spells, lightheadedness -breathing problems -changes in hearing -changes in vision -chest pain -high blood pressure -low blood counts - This drug may decrease the number of white blood cells, red blood cells and platelets. You may be at increased risk for infections and bleeding. -nausea and vomiting -pain, swelling, redness or irritation at the injection site -pain, tingling, numbness in the hands or feet -problems with balance, talking, walking -trouble passing urine or change in the amount of urine Side effects that usually do not require medical attention (report to your doctor or health care professional if they continue or are bothersome): -hair loss -loss of appetite -metallic taste in the mouth or changes in taste This list may not describe all possible side effects. Call your doctor for medical advice about side effects. You may report side effects to FDA at 1-800-FDA-1088. Where should I keep my medicine? This drug is given in a hospital or clinic and will not be stored at home. NOTE: This sheet is a summary. It may not cover all possible information. If you have questions about this medicine, talk to your doctor, pharmacist, or health care provider.  2019 Elsevier/Gold Standard (2007-11-11 14:38:05)

## 2018-11-10 ENCOUNTER — Telehealth: Payer: Self-pay

## 2018-11-10 ENCOUNTER — Ambulatory Visit: Payer: Medicare HMO | Admitting: Family Medicine

## 2018-11-10 NOTE — Telephone Encounter (Signed)
Called and spoke with daughter Katie Myers. Katie Myers is doing well. Except for constipation yesterday. Yesterday she was so constipated that she used a fleets enema, then she has diarrhea. Her bowels are back to normal today. Instructed daughter to not use anymore enema's. Instructed to start Miralax daily and add a stool softener daily. Intructed to call the office for questions or concerns. Daughter verbalized understanding.

## 2018-11-10 NOTE — Telephone Encounter (Signed)
-----   Message from Zola Button, RN sent at 11/07/2018  2:26 PM EDT ----- Regarding: Dr. Alvy Bimler; First time F/U Call Pt received first time Taxol/Carboplatin on 11/07/18. Tolerated well. Thank you!  --Althia Forts

## 2018-11-10 NOTE — Telephone Encounter (Signed)
Daughter called and left message to call her.  Called back. Blood pressure is 144/62 and temperature is 98.4, she is concerned. Told her bp and temperature are good. She verbalized understanding.

## 2018-11-12 ENCOUNTER — Telehealth: Payer: Self-pay

## 2018-11-12 ENCOUNTER — Other Ambulatory Visit: Payer: Self-pay

## 2018-11-12 MED ORDER — DULOXETINE HCL 20 MG PO CPEP: 20.0000 mg | ORAL_CAPSULE | Freq: Every day | ORAL | 3 refills | Status: DC

## 2018-11-12 NOTE — Telephone Encounter (Signed)
-----   Message from Heath Lark, MD sent at 11/12/2018 11:51 AM EDT ----- Regarding: RE: not sleeping at night We will reduce dose of chemo in her next visit I recommend keeping her feet warm She was on gabapentin but I think she stopped because she didn't like it We can call in cymbalta 20 mg daily to try; take at night The reason for not sleeping is due to her recent dexamethasone. It should wear off in a few days. The cymbalta should help with sedation. For now, I recommend benadryl 50 mg daily at night ----- Message ----- From: Paulla Dolly, RN Sent: 11/12/2018   9:09 AM EDT To: Heath Lark, MD Subject: not sleeping at night                          Dtr called.  Pt not sleeping at night.   Dtr states she "moans all night long".   She also says neuropathy in feet has gotten much worse.   Pt goes to pain clinic but dtr says they dont want to make changes to her pain meds without your input.   Drt is requesting a sleep aid medication.  Does she also need something to help control neuropathy?

## 2018-11-12 NOTE — Telephone Encounter (Signed)
Spoke with Dtr and gave below msg.  She verbalizes understanding of instructions.  Prescription sen to pharmacy.

## 2018-11-17 ENCOUNTER — Inpatient Hospital Stay: Payer: Medicare HMO | Admitting: Nutrition

## 2018-11-17 ENCOUNTER — Telehealth: Payer: Self-pay

## 2018-11-17 ENCOUNTER — Inpatient Hospital Stay (HOSPITAL_BASED_OUTPATIENT_CLINIC_OR_DEPARTMENT_OTHER): Payer: Medicare HMO | Admitting: Hematology and Oncology

## 2018-11-17 ENCOUNTER — Encounter: Payer: Self-pay | Admitting: Hematology and Oncology

## 2018-11-17 ENCOUNTER — Inpatient Hospital Stay: Payer: Medicare HMO

## 2018-11-17 ENCOUNTER — Telehealth: Payer: Self-pay | Admitting: Nutrition

## 2018-11-17 ENCOUNTER — Other Ambulatory Visit: Payer: Self-pay

## 2018-11-17 DIAGNOSIS — K5909 Other constipation: Secondary | ICD-10-CM

## 2018-11-17 DIAGNOSIS — Z7189 Other specified counseling: Secondary | ICD-10-CM

## 2018-11-17 DIAGNOSIS — E1165 Type 2 diabetes mellitus with hyperglycemia: Secondary | ICD-10-CM

## 2018-11-17 DIAGNOSIS — R5381 Other malaise: Secondary | ICD-10-CM

## 2018-11-17 DIAGNOSIS — C539 Malignant neoplasm of cervix uteri, unspecified: Secondary | ICD-10-CM

## 2018-11-17 DIAGNOSIS — Z5111 Encounter for antineoplastic chemotherapy: Secondary | ICD-10-CM | POA: Diagnosis not present

## 2018-11-17 DIAGNOSIS — D630 Anemia in neoplastic disease: Secondary | ICD-10-CM | POA: Insufficient documentation

## 2018-11-17 DIAGNOSIS — R64 Cachexia: Secondary | ICD-10-CM

## 2018-11-17 DIAGNOSIS — E1140 Type 2 diabetes mellitus with diabetic neuropathy, unspecified: Secondary | ICD-10-CM

## 2018-11-17 DIAGNOSIS — C7800 Secondary malignant neoplasm of unspecified lung: Secondary | ICD-10-CM | POA: Diagnosis not present

## 2018-11-17 DIAGNOSIS — IMO0002 Reserved for concepts with insufficient information to code with codable children: Secondary | ICD-10-CM

## 2018-11-17 DIAGNOSIS — K5900 Constipation, unspecified: Secondary | ICD-10-CM | POA: Insufficient documentation

## 2018-11-17 DIAGNOSIS — C779 Secondary and unspecified malignant neoplasm of lymph node, unspecified: Secondary | ICD-10-CM

## 2018-11-17 DIAGNOSIS — E119 Type 2 diabetes mellitus without complications: Secondary | ICD-10-CM

## 2018-11-17 LAB — CBC WITH DIFFERENTIAL (CANCER CENTER ONLY)
Abs Immature Granulocytes: 0.01 10*3/uL (ref 0.00–0.07)
Basophils Absolute: 0 10*3/uL (ref 0.0–0.1)
Basophils Relative: 0 %
EOS ABS: 0 10*3/uL (ref 0.0–0.5)
EOS PCT: 0 %
HCT: 28.3 % — ABNORMAL LOW (ref 36.0–46.0)
Hemoglobin: 9.1 g/dL — ABNORMAL LOW (ref 12.0–15.0)
Immature Granulocytes: 0 %
Lymphocytes Relative: 27 %
Lymphs Abs: 0.9 10*3/uL (ref 0.7–4.0)
MCH: 30.7 pg (ref 26.0–34.0)
MCHC: 32.2 g/dL (ref 30.0–36.0)
MCV: 95.6 fL (ref 80.0–100.0)
Monocytes Absolute: 0.3 10*3/uL (ref 0.1–1.0)
Monocytes Relative: 10 %
Neutro Abs: 2 10*3/uL (ref 1.7–7.7)
Neutrophils Relative %: 63 %
Platelet Count: 298 10*3/uL (ref 150–400)
RBC: 2.96 MIL/uL — ABNORMAL LOW (ref 3.87–5.11)
RDW: 13.2 % (ref 11.5–15.5)
WBC Count: 3.3 10*3/uL — ABNORMAL LOW (ref 4.0–10.5)
nRBC: 0 % (ref 0.0–0.2)

## 2018-11-17 LAB — CMP (CANCER CENTER ONLY)
ALK PHOS: 79 U/L (ref 38–126)
ALT: 15 U/L (ref 0–44)
AST: 24 U/L (ref 15–41)
Albumin: 2.8 g/dL — ABNORMAL LOW (ref 3.5–5.0)
Anion gap: 9 (ref 5–15)
BUN: 8 mg/dL (ref 8–23)
CALCIUM: 8.6 mg/dL — AB (ref 8.9–10.3)
CO2: 26 mmol/L (ref 22–32)
Chloride: 98 mmol/L (ref 98–111)
Creatinine: 0.64 mg/dL (ref 0.44–1.00)
GFR, Est AFR Am: >60 mL/min (ref >60)
GFR, Estimated: >60 mL/min (ref >60)
Glucose, Bld: 138 mg/dL — ABNORMAL HIGH (ref 70–99)
Potassium: 3.9 mmol/L (ref 3.5–5.1)
Sodium: 133 mmol/L — ABNORMAL LOW (ref 135–145)
TOTAL PROTEIN: 7.5 g/dL (ref 6.5–8.1)
Total Bilirubin: 0.5 mg/dL (ref 0.3–1.2)

## 2018-11-17 LAB — SAMPLE TO BLOOD BANK

## 2018-11-17 MED ORDER — DEXAMETHASONE 4 MG PO TABS: ORAL_TABLET | ORAL | 0 refills | Status: DC

## 2018-11-17 NOTE — Telephone Encounter (Signed)
RD working remotely.  Contacted patient's daughter, [name], to inquire about her mom. Patient is a 73 yo female diagnosed with stage IV Cervical cancer.  PMH includes DM, Stroke, HTN, and Anemia.  Medications include Lipitor, Glucotrol XL, Glucophage, MVI and Vit B12.  Labs were reviewed.  Ht: 64 inches Wt:135 pounds March 18 BMI: 22.47  Nutrition Diagnosis: Food and Nutrition Diagnosis related to Cervical cancer as evidenced by no prior need for nutrition related information.  Intervention: Educated patient should continue small frequent meals and snacks. Continue Boost and try to drink 2-3 daily. Will mail fact sheets on increasing calories and protein and soft protein foods with coupons to patient.  Monitoring, Evaluation, Goals: Patient will consume adequate calories and protein to minimize wt loss.  Next Visit: Patient has my contact information for questions or concerns.

## 2018-11-17 NOTE — Assessment & Plan Note (Signed)
She is recommended frequent small meals She has a visit with dietitian today for review of dietary supplement.

## 2018-11-17 NOTE — Assessment & Plan Note (Signed)
She tolerated treatment well except for multiple bowel movement which I think is related to positive signs of response to treatment Continue aggressive supportive care I will see her next week as scheduled

## 2018-11-17 NOTE — Progress Notes (Signed)
Met with patient and daughter(Katie Myers) at registration to introduce myself as Arboriculturist and to offer available resources.  Discussed one-time $1000 Radio broadcast assistant to assist with personal expenses while going through treatment.  Discussed copay assistance through [employer] to assist with treatment related expenses.   Advised what was needed to apply for both. Proof of income was provided by daughter today. Gave her my card to discuss assistance in detai and for any additional financial questions or concerns. Patient approved for the one-time $1000 [employer] grant to assist with personal expenses. Will have sign and go over when available.  Applied for copay assistance online. She was approved for $2500 guaranteed   11/17/18 - 11/17/19 with a lookback date of 05/21/18. Copy of the approval letter will be mailed for patient's records only. I will discuss with patient/daughter as well.

## 2018-11-17 NOTE — Telephone Encounter (Signed)
RD working remotely.  I contacted patients niece [name] and reviewed same information I provided to patient's daughter earlier. Encouraged small frequent meals and snacks as well as Boost Plus TID between meals. [name] expressed understanding and appreciation for call.  Reminded her written information was being mailed along with contact number.

## 2018-11-17 NOTE — Assessment & Plan Note (Signed)
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

## 2018-11-17 NOTE — Progress Notes (Signed)
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  Other constipation The patient actually had frequent bowel movement but not diarrhea I suspect she have chronic constipation that is now resolving after chemo We discussed conservative management for constipation and hemorrhoid   No orders of the defined types were placed in this encounter.   INTERVAL HISTORY: Please see below for problem oriented charting. She is seen today for aggressive supportive care After chemotherapy, she had poor sleep and exacerbation of severe neuropathy She also have frequent  bowel movement with passage of hard stool She had recent exacerbation of hemorrhoid She is able to tolerate frequent small meals With Cymbalta, her neuropathy has improved a bit. She denies nausea The patient denies any recent signs or symptoms of bleeding such as spontaneous epistaxis, hematuria or hematochezia.   SUMMARY OF ONCOLOGIC HISTORY:   Cervical cancer ([place])   12/26/2017 Initial Diagnosis    She presented to the ER with symptomatic anemia and had postmenopausal bleeding. Bimanual exam at that time revealed abnormal exam    12/26/2017 Imaging    US pelvis Limited exam due to difficulty with visualization of the uterus and endometrium.  Calcified submucosal fibroid.  Endometrium measures 10 mm. Endometrium is poorly visualized and not adequately assessed. If bleeding remains unresponsive to hormonal or medical therapy, sonohysterogram should be considered for focal lesion work-up    05/29/2018 Pathology Results    1.  Endometrium, biopsy: - Atypical squamous epithelium with dysplastic features highly suspicious for malignancy.  Note: The biopsy specimen shows squamous epithelium and marked acute inflammation with associated individual squamous cells suggestive of tumor diathesis.  These individual squamous cells show koilocytic changes with cytological features approaching high-grade dysplasia.  Rare keratin pearls are identified.  Areas of more intact squamous epithelium show koilocytic changes, dyskeratosis, and areas of increased mitotic activity (although tissue orientation precludes definitive determination of significance of this finding).  The specimen is negative for endometrial glands.    The clinical findings of a normal cervical exam and upper vaginal nodularity is noted.  Imaging findings of an abnormal uterus are also noted.  The overall findings are extremely concerning for a squamous cell carcinoma, with the differential including vaginal, uterine, or cervical in  origin.  A block has been ordered for additional p16 immunohistochemical studies. If  clinically indicated, additional tissue sampling may be useful for further diagnosis/classification.  An immunohistochemical stain for p16 is positive (strong blocklike reactivity) on intact fragments of squamous epithelium as well as on detached individual squamous cells. All controls showed appropriate reactivity.   Typically primary squamous cell carcinomas of the uterus are p16-negative, therefore the current positive p16 likely reflects an HPV-driven malignancy of cervical or vaginal origin.  High grade dysplasia with individual cells seen representing an element of degeneration in this sample is also technically possible and remains in the differential, however given the clinical and imaging findings I think this is less likely.  Additional clinical and radiological correlation recommended.    07/23/2018 Imaging    CT abdomen and pelvis IMPRESSION: 1. Abnormal uterus with gas in the endometrial cavity, multiple calcified fibroids, and a right rim-enhancing uterine fluid collection measuring 3.3 x 3.5 cm concerning for abscess image 128. This is contiguous with an edematous anterior abdominal wall.  There is soft tissue extension from the uterus to the right which may represent tumor. The uterus is overall poorly defined.  2. Pelvic, retroperitoneal, and portal adenopathy 3. Lung nodules as above. CT chest may be of benefit here. 4. Coronary and aortic ASVD 5. Cholelithiasis 6. Bilateral renal cortical cyst 7. Aortoiliac atherosclerosis without aneurysm 8. Diverticulosis without diverticulitis 10. Fecal retention    09/09/2018 Pathology Results    1. 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  2. 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  3. Endometrium, curettage:  -Fragments of squamous cell  carcinoma with areas of invasion, see comment.  4.  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  COMMENT: The biopsies all show fragments of squamous neoplasm ranging from squamous cell carcinoma in situ to areas of invasive carcinoma.  The previous biopsy (case [id number]) showed strong positive p16 staining.  This, coupled with the clinical impression and the vaginal biopsy, is consistent with a vaginal/cervical origin for this tumor.  The endometrial curettage shows no evidence of endometrial tissue.    10/21/2018 PET scan    1. Extensive abnormal activity throughout the uterus, consistent with malignancy. 2. Hypermetabolic malignant-appearing retroperitoneal, external iliac, and inguinal adenopathy.  3. Numerous bilateral pulmonary nodules, most of which are below PET resolution with the largest demonstrating low-level activity. These are indeterminate, but suspicious for metastatic disease. 4. Numerous enlarged and hypermetabolic mediastinal and bilateral hilar lymph nodes, concerning for metastatic disease.  5. Small lymph node adjacent to the left thyroid lobe with low level FDG activity is nonspecific.    10/23/2018 Cancer Staging    Staging form: Cervix Uteri, AJCC 8th Edition - Clinical: FIGO Stage IVB (cT2, cN1, cM1) - Signed by Heath Lark, MD on 10/23/2018    11/05/2018 Procedure    Successful placement of a right IJ approach Power Port with ultrasound and fluoroscopic guidance. The catheter is ready for use.    11/07/2018 -  Chemotherapy    The patient had carboplatin and taxol     REVIEW OF SYSTEMS:   Constitutional: Denies fevers, chills or abnormal weight loss Eyes: Denies blurriness of vision Ears, nose, mouth, throat, and face: Denies mucositis or sore throat Respiratory: Denies cough, dyspnea or wheezes Cardiovascular: Denies palpitation, chest discomfort or lower extremity swelling Skin: Denies abnormal skin  rashes Lymphatics: Denies new lymphadenopathy or easy bruising Behavioral/Psych: Mood is stable, no new changes  All other systems were reviewed with the patient and are negative.  I have reviewed the past medical history, past surgical history, social history and family history with the patient and they are unchanged from previous note.  ALLERGIES:  has No Known Allergies.  MEDICATIONS:  Current Outpatient Medications  Medication Sig Dispense Refill  . amLODipine (NORVASC) 10 MG tablet Take 10 mg by mouth daily.    Marland Kitchen aspirin EC 81 MG tablet Take 81 mg by mouth daily.    Marland Kitchen atorvastatin (LIPITOR) 40 MG tablet Take 40 mg by mouth daily.    . benazepril (LOTENSIN) 40 MG tablet Take 40 mg by mouth daily.    . carvedilol (COREG) 3.125 MG tablet Take 3.125 mg by mouth 2 (two) times daily with a meal.    . dexamethasone (DECADRON) 4 MG tablet Take 2 tabs at the night before chemotherapy, every 3 weeks, by mouth with food 24 tablet 0  . DULoxetine (CYMBALTA) 20 MG capsule Take 1 capsule (20 mg total) by mouth daily. Take at night time. 30 capsule 3  . glipiZIDE (GLUCOTROL XL) 10 MG 24 hr tablet Take 10 mg by mouth daily with breakfast.    . ibuprofen (ADVIL,MOTRIN) 200 MG tablet Take 200 mg by mouth daily. Pt takes with coreg because coreg gives her headaches    . lidocaine-prilocaine (EMLA) cream Apply to affected area once 30 g 3  . Liniments (SALONPAS PAIN RELIEF PATCH EX) Apply 1 patch topically daily as needed (pain).    . metFORMIN (GLUCOPHAGE) 500 MG tablet Take 1,000 mg by mouth 2 (two) times daily with a meal.    . Multiple Vitamins-Minerals (MULTIVITAMIN WITH MINERALS) tablet Take 1 tablet by mouth daily.    . ondansetron (ZOFRAN) 8 MG tablet Take 1 tablet (8 mg total) by mouth every 8 (eight) hours as needed for refractory nausea / vomiting. Start on day 3 after chemo. 30 tablet 1  . oxyCODONE-acetaminophen (PERCOCET) 7.5-325 MG tablet Take 1 tablet by mouth every 8 (eight) hours as  needed for severe pain.    Marland Kitchen prochlorperazine (COMPAZINE) 10 MG tablet Take 1 tablet (10 mg total) by mouth every 6 (six) hours as needed (Nausea or vomiting). 30 tablet 1  . vitamin B-12 (CYANOCOBALAMIN) 1000 MCG tablet Take 1,000 mcg by mouth daily.     No current facility-administered medications for this visit.     PHYSICAL EXAMINATION: ECOG PERFORMANCE STATUS: 2 - Symptomatic, <50% confined to bed  Vitals:   11/17/18 1321  BP: 140/60  Pulse: 81  Resp: 17  Temp: 98.8 F (37.1 C)  SpO2: 100%   Filed Weights   11/17/18 1321  Weight: 131 lb 9.6 oz (59.7 kg)    GENERAL:alert, no distress and comfortable Musculoskeletal:no cyanosis of digits and no clubbing  NEURO: alert & oriented x 3 with fluent speech, no focal motor/sensory deficits  LABORATORY DATA:  I have reviewed the data as listed    Component Value Date/Time   NA 133 (L) 11/17/2018 1251   K 3.9 11/17/2018 1251   CL 98 11/17/2018 1251   CO2 26 11/17/2018 1251   GLUCOSE 138 (H) 11/17/2018 1251   BUN 8 11/17/2018 1251   CREATININE 0.64 11/17/2018 1251   CALCIUM 8.6 (L) 11/17/2018 1251   PROT 7.5 11/17/2018 1251   ALBUMIN 2.8 (L) 11/17/2018 1251   AST 24 11/17/2018 1251   ALT 15 11/17/2018 1251   ALKPHOS 79 11/17/2018 1251   BILITOT 0.5 11/17/2018 1251   GFRNONAA >  60 11/17/2018 1251   GFRAA >60 11/17/2018 1251    No results found for: SPEP, UPEP  Lab Results  Component Value Date   WBC 3.3 (L) 11/17/2018   NEUTROABS 2.0 11/17/2018   HGB 9.1 (L) 11/17/2018   HCT 28.3 (L) 11/17/2018   MCV 95.6 11/17/2018   PLT 298 11/17/2018      Chemistry      Component Value Date/Time   NA 133 (L) 11/17/2018 1251   K 3.9 11/17/2018 1251   CL 98 11/17/2018 1251   CO2 26 11/17/2018 1251   BUN 8 11/17/2018 1251   CREATININE 0.64 11/17/2018 1251      Component Value Date/Time   CALCIUM 8.6 (L) 11/17/2018 1251   ALKPHOS 79 11/17/2018 1251   AST 24 11/17/2018 1251   ALT 15 11/17/2018 1251   BILITOT 0.5  11/17/2018 1251       RADIOGRAPHIC STUDIES: I have personally reviewed the radiological images as listed and agreed with the findings in the report. Ir Imaging Guided Port Insertion  Result Date: 11/05/2018 INDICATION: 73 year old female with cervical cancer in need of durable venous access for chemotherapy. She presents for port catheter placement. EXAM: IMPLANTED PORT A CATH PLACEMENT WITH ULTRASOUND AND FLUOROSCOPIC GUIDANCE MEDICATIONS: Ancef 2 g; The antibiotic was administered within an appropriate time interval prior to skin puncture. ANESTHESIA/SEDATION: Versed 1 mg IV; Fentanyl 50 mcg IV; Moderate Sedation Time:  25 minutes The patient was continuously monitored during the procedure by the interventional radiology nurse under my direct supervision. FLUOROSCOPY TIME:  0 minutes, 18 seconds (1 mGy) COMPLICATIONS: None immediate. PROCEDURE: The right neck and chest was prepped with chlorhexidine, and draped in the usual sterile fashion using maximum barrier technique (cap and mask, sterile gown, sterile gloves, large sterile sheet, hand hygiene and cutaneous antiseptic). Local anesthesia was attained by infiltration with 1% lidocaine with epinephrine. Ultrasound demonstrated patency of the right internal jugular vein, and this was documented with an image. Under real-time ultrasound guidance, this vein was accessed with a 21 gauge micropuncture needle and image documentation was performed. A small dermatotomy was made at the access site with an 11 scalpel. A 0.018" wire was advanced into the SVC and the access needle exchanged for a 34F micropuncture vascular sheath. The 0.018" wire was then removed and a 0.035" wire advanced into the IVC. An appropriate location for the subcutaneous reservoir was selected below the clavicle and an incision was made through the skin and underlying soft tissues. The subcutaneous tissues were then dissected using a combination of blunt and sharp surgical technique and a  pocket was formed. A single lumen power injectable portacatheter was then tunneled through the subcutaneous tissues from the pocket to the dermatotomy and the port reservoir placed within the subcutaneous pocket. The venous access site was then serially dilated and a peel away vascular sheath placed over the wire. The wire was removed and the port catheter advanced into position under fluoroscopic guidance. The catheter tip is positioned in the superior cavoatrial junction. This was documented with a spot image. The portacatheter was then tested and found to flush and aspirate well. The port was flushed with saline followed by 100 units/mL heparinized saline. The pocket was then closed in two layers using first subdermal inverted interrupted absorbable sutures followed by a running subcuticular suture. The epidermis was then sealed with Dermabond. The dermatotomy at the venous access site was also closed with Dermabond. IMPRESSION: Successful placement of a right IJ approach Power Port with  ultrasound and fluoroscopic guidance. The catheter is ready for use. Electronically Signed   By: Jacqulynn Cadet M.D.   On: 11/05/2018 16:03    All questions were answered. The patient knows to call the clinic with any problems, questions or concerns. No barriers to learning was detected.  I spent 25 minutes counseling the patient face to face. The total time spent in the appointment was 30 minutes and more than 50% was on counseling and review of test results  Heath Lark, MD 11/17/2018 2:28 PM

## 2018-11-17 NOTE — Assessment & Plan Note (Signed)
The patient actually had frequent bowel movement but not diarrhea I suspect she have chronic constipation that is now resolving after chemo We discussed conservative management for constipation and hemorrhoid

## 2018-11-17 NOTE — Telephone Encounter (Signed)
[name] called and left a message to call.  Called back. She is hard to understand on the phone and crying. She said her mom is on the way to the appt.

## 2018-11-17 NOTE — Assessment & Plan Note (Signed)
She has multifactorial anemia She is not symptomatic Observe only She does not need transfusion support

## 2018-11-17 NOTE — Assessment & Plan Note (Signed)
Her recent blood sugar control is fair I recommend reduce oral premedication dexamethasone in the future to only take it the night before treatment I will reduce IV premedication dexamethasone to avoid significant insomnia after treatment.

## 2018-11-19 ENCOUNTER — Encounter: Payer: Self-pay | Admitting: Family Medicine

## 2018-11-20 ENCOUNTER — Ambulatory Visit: Payer: Medicare HMO | Admitting: Physical Therapy

## 2018-11-20 ENCOUNTER — Encounter: Payer: Self-pay | Admitting: Family Medicine

## 2018-11-20 ENCOUNTER — Ambulatory Visit (INDEPENDENT_AMBULATORY_CARE_PROVIDER_SITE_OTHER): Payer: Medicare HMO | Admitting: Family Medicine

## 2018-11-20 VITALS — BP 131/45 | HR 79 | Temp 98.7°F | Ht 65.0 in | Wt 131.0 lb

## 2018-11-20 DIAGNOSIS — E119 Type 2 diabetes mellitus without complications: Secondary | ICD-10-CM

## 2018-11-20 DIAGNOSIS — M170 Bilateral primary osteoarthritis of knee: Secondary | ICD-10-CM | POA: Insufficient documentation

## 2018-11-20 DIAGNOSIS — R64 Cachexia: Secondary | ICD-10-CM

## 2018-11-20 DIAGNOSIS — E1140 Type 2 diabetes mellitus with diabetic neuropathy, unspecified: Secondary | ICD-10-CM | POA: Diagnosis not present

## 2018-11-20 DIAGNOSIS — IMO0002 Reserved for concepts with insufficient information to code with codable children: Secondary | ICD-10-CM

## 2018-11-20 DIAGNOSIS — C539 Malignant neoplasm of cervix uteri, unspecified: Secondary | ICD-10-CM

## 2018-11-20 DIAGNOSIS — I10 Essential (primary) hypertension: Secondary | ICD-10-CM

## 2018-11-20 DIAGNOSIS — E1165 Type 2 diabetes mellitus with hyperglycemia: Secondary | ICD-10-CM

## 2018-11-20 DIAGNOSIS — Z1159 Encounter for screening for other viral diseases: Secondary | ICD-10-CM

## 2018-11-20 NOTE — Progress Notes (Signed)
Patient: Katie Myers MRN: [id number] DOB: March 30, 1946 PCP: [name], MD     I connected with Katie Myers on 11/20/18 at 10:45am  by a video enabled telemedicine application and verified that I am speaking with the correct person using two identifiers.  Location patient: Home Location provider: [facility], Office Persons participating in this virtual visit: Joslin Doell, Dr. Rogers Blocker and [name] (niece) and [name] (daugther of [name])   I discussed the limitations of evaluation and management by telemedicine and the availability of in person appointments. The patient expressed understanding and agreed to proceed.   Subjective:  Chief Complaint  Patient presents with  . Hypertension  . Diabetes    HPI: The patient is a 74 y.o. female who presents today for establishing care. She has a medical history of newly diagnosed cervical cancer stage IV, HTN, type 2 diabetes, chronic blood loss/neoplastic disease anemia and prior hx of a stroke.   Hypertension: Here for follow up of hypertension.  Currently on norvasc, benzapril, and coreg. Home readings range from 106-269 SWNIOEVO/35 diastolic. Takes medication as prescribed and denies any side effects. Exercise includes none. Weight has been decreasing. Denies any chest pain, headaches, shortness of breath, vision changes, swelling in lower extremities.   Diabetes: Patient is here for follow up of type 2 diabetes. First diagnosed 3. Had a stroke following this.   Currently on the following medications metformin and glipizide. Takes medications as prescribed. Last A1C was 6.1. (last checked a year ago).  Currently NOT exercising and following diabetic diet.   Denies any hypoglycemic events. Denies any vision changes, nausea, vomiting, abdominal pain, ulcers in feet, polyuria, polydipsia or polyphagia. Denies any chest pain, shortness of breath. She does have neuropathy. Just put on cymbalta for neuropathy by Dr. Alvy Bimler.   -has  not had eyes checked in 2 years. Has an eye doctor. On statin. Has had pneumonia shot.   FH of HTN in both her mother and father. Colon cancer in her mother.   She has not had a mmg   Review of Systems  Constitutional: Positive for fatigue and unexpected weight change. Negative for chills and fever.       Patient's niece states she is underweight  HENT: Negative for congestion, ear pain, rhinorrhea, sinus pressure, sinus pain and sore throat.   Eyes: Negative for photophobia and visual disturbance.  Respiratory: Negative for cough and shortness of breath.   Cardiovascular: Negative.  Negative for chest pain, palpitations and leg swelling.  Gastrointestinal: Positive for diarrhea. Negative for abdominal pain and nausea.  Genitourinary: Negative.   Musculoskeletal: Positive for arthralgias. Negative for back pain, myalgias and neck pain.  Skin: Negative.   Neurological: Positive for light-headedness. Negative for dizziness and headaches.  Psychiatric/Behavioral: Negative for sleep disturbance.    Allergies Patient has No Known Allergies.  Past Medical History Patient  has a past medical history of Cancer ([place]), Diabetes mellitus without complication ([place]), Hyperlipidemia, Hypertension, Knee pain, bilateral, Neuropathy, Stroke ([place]), and Underweight.  Surgical History Patient  has a past surgical history that includes IR IMAGING GUIDED PORT INSERTION (11/05/2018).  Family History Pateint's family history includes Cancer in her maternal aunt, maternal grandfather, maternal uncle, and mother.  Social History Patient  reports that she has quit smoking. Her smoking use included cigarettes. She smoked 0.50 packs per day. She has never used smokeless tobacco. She reports that she does not drink alcohol or use drugs.    Objective: Vitals:   11/20/18 1017  BP: (!) 131/45  Pulse: 79  Temp: 98.7 F (37.1 C)  TempSrc: Temporal  Weight: 131 lb (59.4 kg)  Height: 5\' 5"  (1.651 m)     Body mass index is 21.8 kg/m.  Physical Exam Constitutional:      Comments: Cachetic   HENT:     Head: Normocephalic and atraumatic.  Eyes:     Extraocular Movements: Extraocular movements intact.  Pulmonary:     Effort: Pulmonary effort is normal.  Neurological:     General: No focal deficit present.     Mental Status: She is alert and oriented to person, place, and time.  Psychiatric:        Mood and Affect: Mood normal.        Behavior: Behavior normal.       Assessment/plan: 1. Essential hypertension Blood pressure is to goal. Continue current anti-hypertensive medications. no Refills needed and lab work done at [facility]. Will check lipid panel. No up/uc, already on acei.  F/u in 3-6 months.    2. Uncontrolled type 2 diabetes mellitus with diabetic neuropathy, without long-term current use of insulin ([place]) Need to get an a1c on her. This was ordered and hopefully we can get on next lab draw at cancer center so she doesn't have to make 2 trips. On metformin and glipizide. Discussed I prefer to not prescribe glipizide and if we can change her to a newer drug, we will. Will get labs back first. Recommended she f/u with her eye doctor. On statin and states she has had her pneumonia shot. Again, requesting records. Will see her in office in 3-6 months.   3. Primary osteoarthritis of both knees Very poor mobility and pain. Sees pain management for this and on percocet. I think home health would be great for her. Will have both OT/PT to come out to assess house and give her exercises to do.    4. Encounter for hepatitis C screening test for low risk patient  - Hepatitis C antibody; Future  5  . Malignant cachexia ([place]) She has been drinking boost. Recommended they change to glucerna due to her diabetes. Hopefully this improves as chemo goes on.. did discuss if still a concern could do trial of remeron, but will watch for now.   6) home health referral -patient needs home  health due to being homebound, requiring assistance to leave the office and limited mobility in her home due to debilitation and OA. PT/OT ordered.   Requesting records from previous physician for HM/immunizations/notes/cscope.    Return in about 3 months (around 02/19/2019) for diabetes/htn .  Records requested if needed. Time spent with patient: 40   minutes, of which >50% was spent in obtaining information about her symptoms, reviweing her previous labs, evaluations, and treatments, counseling her about her conditions (please see discussed topics above), and developing a plan to further investigate it; she had a number of questions which I addressed.    [name], MD [place]  11/20/2018

## 2018-11-27 ENCOUNTER — Telehealth: Payer: Self-pay

## 2018-11-27 ENCOUNTER — Encounter: Payer: Self-pay | Admitting: Hematology and Oncology

## 2018-11-27 ENCOUNTER — Other Ambulatory Visit: Payer: Self-pay

## 2018-11-27 ENCOUNTER — Inpatient Hospital Stay: Payer: Medicare HMO | Attending: Hematology and Oncology

## 2018-11-27 ENCOUNTER — Telehealth: Payer: Self-pay | Admitting: Family Medicine

## 2018-11-27 ENCOUNTER — Other Ambulatory Visit: Payer: Self-pay | Admitting: Hematology and Oncology

## 2018-11-27 ENCOUNTER — Inpatient Hospital Stay (HOSPITAL_BASED_OUTPATIENT_CLINIC_OR_DEPARTMENT_OTHER): Payer: Medicare HMO | Admitting: Hematology and Oncology

## 2018-11-27 DIAGNOSIS — I10 Essential (primary) hypertension: Secondary | ICD-10-CM

## 2018-11-27 DIAGNOSIS — E119 Type 2 diabetes mellitus without complications: Secondary | ICD-10-CM | POA: Insufficient documentation

## 2018-11-27 DIAGNOSIS — C539 Malignant neoplasm of cervix uteri, unspecified: Secondary | ICD-10-CM

## 2018-11-27 DIAGNOSIS — R808 Other proteinuria: Secondary | ICD-10-CM | POA: Insufficient documentation

## 2018-11-27 DIAGNOSIS — C779 Secondary and unspecified malignant neoplasm of lymph node, unspecified: Secondary | ICD-10-CM

## 2018-11-27 DIAGNOSIS — Z79899 Other long term (current) drug therapy: Secondary | ICD-10-CM | POA: Insufficient documentation

## 2018-11-27 DIAGNOSIS — R5381 Other malaise: Secondary | ICD-10-CM

## 2018-11-27 DIAGNOSIS — C7800 Secondary malignant neoplasm of unspecified lung: Secondary | ICD-10-CM | POA: Insufficient documentation

## 2018-11-27 DIAGNOSIS — D630 Anemia in neoplastic disease: Secondary | ICD-10-CM | POA: Insufficient documentation

## 2018-11-27 DIAGNOSIS — E1165 Type 2 diabetes mellitus with hyperglycemia: Secondary | ICD-10-CM

## 2018-11-27 DIAGNOSIS — Z7189 Other specified counseling: Secondary | ICD-10-CM

## 2018-11-27 DIAGNOSIS — Z5111 Encounter for antineoplastic chemotherapy: Secondary | ICD-10-CM | POA: Insufficient documentation

## 2018-11-27 DIAGNOSIS — E1140 Type 2 diabetes mellitus with diabetic neuropathy, unspecified: Secondary | ICD-10-CM

## 2018-11-27 DIAGNOSIS — IMO0002 Reserved for concepts with insufficient information to code with codable children: Secondary | ICD-10-CM

## 2018-11-27 LAB — CBC WITH DIFFERENTIAL (CANCER CENTER ONLY)
Abs Immature Granulocytes: 0.04 10*3/uL (ref 0.00–0.07)
Basophils Absolute: 0 10*3/uL (ref 0.0–0.1)
Basophils Relative: 0 %
Eosinophils Absolute: 0 10*3/uL (ref 0.0–0.5)
Eosinophils Relative: 0 %
HCT: 34.1 % — ABNORMAL LOW (ref 36.0–46.0)
Hemoglobin: 10.6 g/dL — ABNORMAL LOW (ref 12.0–15.0)
Immature Granulocytes: 0 %
Lymphocytes Relative: 15 %
Lymphs Abs: 1.5 10*3/uL (ref 0.7–4.0)
MCH: 29.9 pg (ref 26.0–34.0)
MCHC: 31.1 g/dL (ref 30.0–36.0)
MCV: 96.1 fL (ref 80.0–100.0)
Monocytes Absolute: 0.6 10*3/uL (ref 0.1–1.0)
Monocytes Relative: 6 %
Neutro Abs: 7.7 10*3/uL (ref 1.7–7.7)
Neutrophils Relative %: 79 %
Platelet Count: 352 10*3/uL (ref 150–400)
RBC: 3.55 MIL/uL — ABNORMAL LOW (ref 3.87–5.11)
RDW: 13.8 % (ref 11.5–15.5)
WBC Count: 9.9 10*3/uL (ref 4.0–10.5)
nRBC: 0 % (ref 0.0–0.2)

## 2018-11-27 LAB — LIPID PANEL
Cholesterol: 109 mg/dL (ref 0–200)
HDL: 40 mg/dL — ABNORMAL LOW (ref >40)
LDL Cholesterol: 57 mg/dL (ref 0–99)
Total CHOL/HDL Ratio: 2.7 RATIO
Triglycerides: 60 mg/dL (ref <150)
VLDL: 12 mg/dL (ref 0–40)

## 2018-11-27 LAB — CMP (CANCER CENTER ONLY)
ALT: 12 U/L (ref 0–44)
AST: 22 U/L (ref 15–41)
Albumin: 2.7 g/dL — ABNORMAL LOW (ref 3.5–5.0)
Alkaline Phosphatase: 93 U/L (ref 38–126)
Anion gap: 10 (ref 5–15)
BUN: 9 mg/dL (ref 8–23)
CO2: 26 mmol/L (ref 22–32)
Calcium: 8.7 mg/dL — ABNORMAL LOW (ref 8.9–10.3)
Chloride: 98 mmol/L (ref 98–111)
Creatinine: 0.64 mg/dL (ref 0.44–1.00)
GFR, Est AFR Am: >60 mL/min (ref >60)
GFR, Estimated: >60 mL/min (ref >60)
Glucose, Bld: 160 mg/dL — ABNORMAL HIGH (ref 70–99)
Potassium: 3.6 mmol/L (ref 3.5–5.1)
Sodium: 134 mmol/L — ABNORMAL LOW (ref 135–145)
Total Bilirubin: 0.5 mg/dL (ref 0.3–1.2)
Total Protein: 7.9 g/dL (ref 6.5–8.1)

## 2018-11-27 LAB — SAMPLE TO BLOOD BANK

## 2018-11-27 LAB — HEMOGLOBIN A1C
Hgb A1c MFr Bld: 4.7 % — ABNORMAL LOW (ref 4.8–5.6)
Mean Plasma Glucose: 88.19 mg/dL

## 2018-11-27 LAB — TOTAL PROTEIN, URINE DIPSTICK: Protein, ur: 300 mg/dL — AB

## 2018-11-27 MED ORDER — CARVEDILOL 6.25 MG PO TABS: 6.2500 mg | ORAL_TABLET | Freq: Two times a day (BID) | ORAL | 3 refills | Status: DC

## 2018-11-27 NOTE — Telephone Encounter (Signed)
Patient is scheduled for Monday @ 11

## 2018-11-27 NOTE — Progress Notes (Signed)
[place] OFFICE PROGRESS NOTE  Patient Care Team: [name], MD as PCP - General (Family Medicine) Polly Cobia, Dayton Bailiff, MD as Referring Physician (Obstetrics and Gynecology) Jacqulyn Liner, RN as Oncology Nurse Navigator (Oncology) Heath Lark, MD as Consulting Physician (Hematology and Oncology)  ASSESSMENT & PLAN:  Cervical cancer [facility]) Unfortunately, she continues to have persistent proteinuria which I suspect is due to her diabetes and hypertension She is not able to start bevacizumab She will proceed with carboplatin and Taxol only Clinically, she is improving with weight gain  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 She is not symptomatic Observe only She does not need transfusion support  Essential hypertension Her blood pressure is improved She still have significant proteinuria She will continue medication adjustment through her primary care doctor  Uncontrolled type 2 diabetes mellitus with diabetic neuropathy, without long-term current use of insulin ([place]) Her neuropathy is stable with low-dose Cymbalta The dose of paclitaxel is further reduced She will continue aggressive management of diabetes through her primary care doctor  Other proteinuria She still has significant proteinuria We will continue risk factor modification I will continue to hold bevacizumab   No orders of the defined types were placed in this encounter.   INTERVAL HISTORY: Please see below for problem oriented charting. She returns for further follow-up, to be seen prior to cycle 2 of chemotherapy She saw her primary care doctor recently She felt that the neuropathy is stable since she started on Cymbalta Constipation has resolved She complained of mild fatigue  SUMMARY OF ONCOLOGIC HISTORY:   Cervical cancer ([place])   12/26/2017 Initial Diagnosis    She presented to the ER with symptomatic anemia and had postmenopausal bleeding. Bimanual exam at  that time revealed abnormal exam    12/26/2017 Imaging    US pelvis Limited exam due to difficulty with visualization of the uterus and endometrium.  Calcified submucosal fibroid.  Endometrium measures 10 mm. Endometrium is poorly visualized and not adequately assessed. If bleeding remains unresponsive to hormonal or medical therapy, sonohysterogram should be considered for focal lesion work-up    05/29/2018 Pathology Results    1.  Endometrium, biopsy: - Atypical squamous epithelium with dysplastic features highly suspicious for malignancy.  Note: The biopsy specimen shows squamous epithelium and marked acute inflammation with associated individual squamous cells suggestive of tumor diathesis.  These individual squamous cells show koilocytic changes with cytological features approaching high-grade dysplasia.  Rare keratin pearls are identified.  Areas of more intact squamous epithelium show koilocytic changes, dyskeratosis, and areas of increased mitotic activity (although tissue orientation precludes definitive determination of significance of this finding).  The specimen is negative for endometrial glands.    The clinical findings of a normal cervical exam and upper vaginal nodularity is noted.  Imaging findings of an abnormal uterus are also noted.  The overall findings are extremely concerning for a squamous cell carcinoma, with the differential including vaginal, uterine, or cervical in origin.  A block has been ordered for additional p16 immunohistochemical studies. If clinically indicated, additional tissue sampling may be useful for further diagnosis/classification.  An immunohistochemical stain for p16 is positive (strong blocklike reactivity) on intact fragments of squamous epithelium as well as on detached individual squamous cells. All controls showed appropriate reactivity.   Typically primary squamous cell carcinomas of the uterus are p16-negative, therefore the current positive  p16 likely reflects an HPV-driven malignancy of cervical or vaginal origin.  High grade dysplasia with individual cells seen  representing an element of degeneration in this sample is also technically possible and remains in the differential, however given the clinical and imaging findings I think this is less likely.  Additional clinical and radiological correlation recommended.    07/23/2018 Imaging    CT abdomen and pelvis IMPRESSION: 1. Abnormal uterus with gas in the endometrial cavity, multiple calcified fibroids, and a right rim-enhancing uterine fluid collection measuring 3.3 x 3.5 cm concerning for abscess image 128. This is contiguous with an edematous anterior abdominal wall.  There is soft tissue extension from the uterus to the right which may represent tumor. The uterus is overall poorly defined.  2. Pelvic, retroperitoneal, and portal adenopathy 3. Lung nodules as above. CT chest may be of benefit here. 4. Coronary and aortic ASVD 5. Cholelithiasis 6. Bilateral renal cortical cyst 7. Aortoiliac atherosclerosis without aneurysm 8. Diverticulosis without diverticulitis 10. Fecal retention    09/09/2018 Pathology Results    1. 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  2. 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  3. Endometrium, curettage:  -Fragments of squamous cell carcinoma with areas of invasion, see comment.  4.  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  COMMENT: The biopsies all show fragments of squamous neoplasm ranging from squamous cell carcinoma in situ to areas of invasive carcinoma.  The previous biopsy (case [id number]) showed strong positive p16 staining.  This, coupled with the clinical impression and the vaginal biopsy, is consistent with a vaginal/cervical origin for this tumor.  The endometrial  curettage shows no evidence of endometrial tissue.    10/21/2018 PET scan    1. Extensive abnormal activity throughout the uterus, consistent with malignancy. 2. Hypermetabolic malignant-appearing retroperitoneal, external iliac, and inguinal adenopathy.  3. Numerous bilateral pulmonary nodules, most of which are below PET resolution with the largest demonstrating low-level activity. These are indeterminate, but suspicious for metastatic disease. 4. Numerous enlarged and hypermetabolic mediastinal and bilateral hilar lymph nodes, concerning for metastatic disease.  5. Small lymph node adjacent to the left thyroid lobe with low level FDG activity is nonspecific.    10/23/2018 Cancer Staging    Staging form: Cervix Uteri, AJCC 8th Edition - Clinical: FIGO Stage IVB (cT2, cN1, cM1) - Signed by Heath Lark, MD on 10/23/2018    11/05/2018 Procedure    Successful placement of a right IJ approach Power Port with ultrasound and fluoroscopic guidance. The catheter is ready for use.    11/07/2018 -  Chemotherapy    The patient had carboplatin and taxol     REVIEW OF SYSTEMS:   Constitutional: Denies fevers, chills or abnormal weight loss Eyes: Denies blurriness of vision Ears, nose, mouth, throat, and face: Denies mucositis or sore throat Respiratory: Denies cough, dyspnea or wheezes Cardiovascular: Denies palpitation, chest discomfort or lower extremity swelling Gastrointestinal:  Denies nausea, heartburn or change in bowel habits Skin: Denies abnormal skin rashes Lymphatics: Denies new lymphadenopathy or easy bruising Behavioral/Psych: Mood is stable, no new changes  All other systems were reviewed with the patient and are negative.  I have reviewed the past medical history, past surgical history, social history and family history with the patient and they are unchanged from previous note.  ALLERGIES:  has No Known Allergies.  MEDICATIONS:  Current Outpatient Medications  Medication Sig  Dispense Refill  . amLODipine (NORVASC) 10 MG tablet Take 10 mg  by mouth daily.    Marland Kitchen atorvastatin (LIPITOR) 40 MG tablet Take 40 mg by mouth daily.    . benazepril (LOTENSIN) 40 MG tablet Take 40 mg by mouth daily.    . carvedilol (COREG) 3.125 MG tablet Take 3.125 mg by mouth 2 (two) times daily with a meal.    . dexamethasone (DECADRON) 4 MG tablet Take 2 tabs at the night before chemotherapy, every 3 weeks, by mouth with food 24 tablet 0  . DULoxetine (CYMBALTA) 20 MG capsule Take 1 capsule (20 mg total) by mouth daily. Take at night time. 30 capsule 3  . glipiZIDE (GLUCOTROL XL) 10 MG 24 hr tablet Take 10 mg by mouth daily with breakfast.    . ibuprofen (ADVIL,MOTRIN) 200 MG tablet Take 200 mg by mouth daily. Pt takes with coreg because coreg gives her headaches    . Lancets (ONETOUCH DELICA PLUS [id number]) MISC     . lidocaine-prilocaine (EMLA) cream Apply to affected area once 30 g 3  . Liniments (SALONPAS PAIN RELIEF PATCH EX) Apply 1 patch topically daily as needed (pain).    . metFORMIN (GLUCOPHAGE) 500 MG tablet Take 1,000 mg by mouth 2 (two) times daily with a meal.    . Multiple Vitamins-Minerals (MULTIVITAMIN WITH MINERALS) tablet Take 1 tablet by mouth daily.    . ondansetron (ZOFRAN) 8 MG tablet Take 1 tablet (8 mg total) by mouth every 8 (eight) hours as needed for refractory nausea / vomiting. Start on day 3 after chemo. 30 tablet 1  . oxyCODONE-acetaminophen (PERCOCET) 10-325 MG tablet     . prochlorperazine (COMPAZINE) 10 MG tablet Take 1 tablet (10 mg total) by mouth every 6 (six) hours as needed (Nausea or vomiting). 30 tablet 1  . vitamin B-12 (CYANOCOBALAMIN) 1000 MCG tablet Take 1,000 mcg by mouth daily.     No current facility-administered medications for this visit.     PHYSICAL EXAMINATION: ECOG PERFORMANCE STATUS: 2 - Symptomatic, <50% confined to bed  Vitals:   11/27/18 1058  BP: (!) 157/76  Pulse: (!) 111  Resp: 20  Temp: 98.7 F (37.1 C)  SpO2: 95%    Filed Weights   11/27/18 1058  Weight: 137 lb 4.8 oz (62.3 kg)    GENERAL:alert, no distress and comfortable SKIN: skin color, texture, turgor are normal, no rashes or significant lesions EYES: normal, Conjunctiva are pink and non-injected, sclera clear OROPHARYNX:no exudate, no erythema and lips, buccal mucosa, and tongue normal  NECK: supple, thyroid normal size, non-tender, without nodularity LYMPH:  no palpable lymphadenopathy in the cervical, axillary or inguinal LUNGS: clear to auscultation and percussion with normal breathing effort HEART: regular rate & rhythm and no murmurs and no lower extremity edema ABDOMEN:abdomen soft, non-tender and normal bowel sounds Musculoskeletal:no cyanosis of digits and no clubbing  NEURO: alert & oriented x 3 with fluent speech, no focal motor/sensory deficits  LABORATORY DATA:  I have reviewed the data as listed    Component Value Date/Time   NA 134 (L) 11/27/2018 1033   K 3.6 11/27/2018 1033   CL 98 11/27/2018 1033   CO2 26 11/27/2018 1033   GLUCOSE 160 (H) 11/27/2018 1033   BUN 9 11/27/2018 1033   CREATININE 0.64 11/27/2018 1033   CALCIUM 8.7 (L) 11/27/2018 1033   PROT 7.9 11/27/2018 1033   ALBUMIN 2.7 (L) 11/27/2018 1033   AST 22 11/27/2018 1033   ALT 12 11/27/2018 1033   ALKPHOS 93 11/27/2018 1033   BILITOT 0.5 11/27/2018 1033  GFRNONAA >60 11/27/2018 1033   GFRAA >60 11/27/2018 1033    No results found for: SPEP, UPEP  Lab Results  Component Value Date   WBC 9.9 11/27/2018   NEUTROABS 7.7 11/27/2018   HGB 10.6 (L) 11/27/2018   HCT 34.1 (L) 11/27/2018   MCV 96.1 11/27/2018   PLT 352 11/27/2018      Chemistry      Component Value Date/Time   NA 134 (L) 11/27/2018 1033   K 3.6 11/27/2018 1033   CL 98 11/27/2018 1033   CO2 26 11/27/2018 1033   BUN 9 11/27/2018 1033   CREATININE 0.64 11/27/2018 1033      Component Value Date/Time   CALCIUM 8.7 (L) 11/27/2018 1033   ALKPHOS 93 11/27/2018 1033   AST 22  11/27/2018 1033   ALT 12 11/27/2018 1033   BILITOT 0.5 11/27/2018 1033       RADIOGRAPHIC STUDIES: I have personally reviewed the radiological images as listed and agreed with the findings in the report. Ir Imaging Guided Port Insertion  Result Date: 11/05/2018 INDICATION: 73 year old female with cervical cancer in need of durable venous access for chemotherapy. She presents for port catheter placement. EXAM: IMPLANTED PORT A CATH PLACEMENT WITH ULTRASOUND AND FLUOROSCOPIC GUIDANCE MEDICATIONS: Ancef 2 g; The antibiotic was administered within an appropriate time interval prior to skin puncture. ANESTHESIA/SEDATION: Versed 1 mg IV; Fentanyl 50 mcg IV; Moderate Sedation Time:  25 minutes The patient was continuously monitored during the procedure by the interventional radiology nurse under my direct supervision. FLUOROSCOPY TIME:  0 minutes, 18 seconds (1 mGy) COMPLICATIONS: None immediate. PROCEDURE: The right neck and chest was prepped with chlorhexidine, and draped in the usual sterile fashion using maximum barrier technique (cap and mask, sterile gown, sterile gloves, large sterile sheet, hand hygiene and cutaneous antiseptic). Local anesthesia was attained by infiltration with 1% lidocaine with epinephrine. Ultrasound demonstrated patency of the right internal jugular vein, and this was documented with an image. Under real-time ultrasound guidance, this vein was accessed with a 21 gauge micropuncture needle and image documentation was performed. A small dermatotomy was made at the access site with an 11 scalpel. A 0.018" wire was advanced into the SVC and the access needle exchanged for a 70F micropuncture vascular sheath. The 0.018" wire was then removed and a 0.035" wire advanced into the IVC. An appropriate location for the subcutaneous reservoir was selected below the clavicle and an incision was made through the skin and underlying soft tissues. The subcutaneous tissues were then dissected using a  combination of blunt and sharp surgical technique and a pocket was formed. A single lumen power injectable portacatheter was then tunneled through the subcutaneous tissues from the pocket to the dermatotomy and the port reservoir placed within the subcutaneous pocket. The venous access site was then serially dilated and a peel away vascular sheath placed over the wire. The wire was removed and the port catheter advanced into position under fluoroscopic guidance. The catheter tip is positioned in the superior cavoatrial junction. This was documented with a spot image. The portacatheter was then tested and found to flush and aspirate well. The port was flushed with saline followed by 100 units/mL heparinized saline. The pocket was then closed in two layers using first subdermal inverted interrupted absorbable sutures followed by a running subcuticular suture. The epidermis was then sealed with Dermabond. The dermatotomy at the venous access site was also closed with Dermabond. IMPRESSION: Successful placement of a right IJ approach Power Port with  ultrasound and fluoroscopic guidance. The catheter is ready for use. Electronically Signed   By: Jacqulynn Cadet M.D.   On: 11/05/2018 16:03    All questions were answered. The patient knows to call the clinic with any problems, questions or concerns. No barriers to learning was detected.  I spent 25 minutes counseling the patient face to face. The total time spent in the appointment was 30 minutes and more than 50% was on counseling and review of test results  Heath Lark, MD 11/27/2018 12:30 PM

## 2018-11-27 NOTE — Telephone Encounter (Signed)
Maddy, please schedule in office appointment per Dr. Rogers Blocker for Monday to come in the side door.

## 2018-11-27 NOTE — Telephone Encounter (Signed)
Spoke with [name], pt's niece, regarding elevated urine protein.  Pt will get Taxol / Carbo tomorrow but no Avastin. [name] states she will call pt's PCP, Dr Joya Gaskins, to have pt evaluated.   Contact info has been updated to include [name] as pt's primary emergency contact.

## 2018-11-27 NOTE — Telephone Encounter (Signed)
New rx for Coreg 6.25 mg BID sent in to [facility] on [address].  Patient aware.

## 2018-11-27 NOTE — Telephone Encounter (Signed)
See note  Copied from [place] [phone]. Topic: General - Other >> Nov 27, 2018  1:43 PM Leward Quan A wrote: Reason for CRM: Patient called to say that per cancer doctor patient BP is high 157/76 and because of this she is not able to get one of her Chemo Therapy this medication can not be given because the protein in her urine is too high. Cancer doctor need Dr Rogers Blocker to get patient regulated ASAP so she can get her proper Chemo Therapy medication. Please advise  [name] [phone]

## 2018-11-27 NOTE — Assessment & Plan Note (Signed)
Unfortunately, she continues to have persistent proteinuria which I suspect is due to her diabetes and hypertension She is not able to start bevacizumab She will proceed with carboplatin and Taxol only Clinically, she is improving with weight gain

## 2018-11-27 NOTE — Assessment & Plan Note (Signed)
She has multifactorial anemia She is not symptomatic Observe only She does not need transfusion support

## 2018-11-27 NOTE — Assessment & Plan Note (Signed)
Her blood pressure is improved She still have significant proteinuria She will continue medication adjustment through her primary care doctor

## 2018-11-27 NOTE — Telephone Encounter (Signed)
What we can do is increase her coreg to 6.25mg  BID from her 3.125 dose. She can just take 2 pills in the AM and 2 pills in the PM or I can send in another px for this dosage. They need to watch her heart rate and her BP. I will need to see them in office on Monday. Can come through side door.   [name], MD [place]

## 2018-11-27 NOTE — Assessment & Plan Note (Addendum)
Her neuropathy is stable with low-dose Cymbalta The dose of paclitaxel is further reduced She will continue aggressive management of diabetes through her primary care doctor

## 2018-11-27 NOTE — Progress Notes (Signed)
Met with patient at registration to obtain signature for one-time $1000 [employer].  Patient approved. Gave her a copy of the approval letter and expense sheet along with the Outpatient pharmacy information.  She has my card for any additional financial questions or concerns and advised to have her daughter/POA to call me as well. She verbalized understanding.

## 2018-11-27 NOTE — Assessment & Plan Note (Signed)
She still has significant proteinuria We will continue risk factor modification I will continue to hold bevacizumab

## 2018-11-27 NOTE — Telephone Encounter (Signed)
Spoke with patient's niece, [name].  They would prefer to have new rx w/adjusted dosage of Coreg sent in to pharmacy.  Advised patient's niece of dosage instructions and she verbalized understanding.  Confirmed in office appt for 4/13 @ 11 am

## 2018-11-28 ENCOUNTER — Other Ambulatory Visit: Payer: Self-pay

## 2018-11-28 ENCOUNTER — Inpatient Hospital Stay: Payer: Medicare HMO

## 2018-11-28 VITALS — BP 160/79 | HR 100 | Temp 97.6°F | Resp 20

## 2018-11-28 DIAGNOSIS — C539 Malignant neoplasm of cervix uteri, unspecified: Secondary | ICD-10-CM

## 2018-11-28 DIAGNOSIS — Z7189 Other specified counseling: Secondary | ICD-10-CM

## 2018-11-28 DIAGNOSIS — Z5111 Encounter for antineoplastic chemotherapy: Secondary | ICD-10-CM | POA: Diagnosis not present

## 2018-11-28 MED ORDER — DIPHENHYDRAMINE HCL 50 MG/ML IJ SOLN
25.0000 mg | Freq: Once | INTRAMUSCULAR | Status: AC
  Administered 2018-11-28: 10:00:00 25 mg via INTRAVENOUS

## 2018-11-28 MED ORDER — DEXAMETHASONE SODIUM PHOSPHATE 10 MG/ML IJ SOLN
10.0000 mg | Freq: Once | INTRAMUSCULAR | Status: AC
  Administered 2018-11-28: 10 mg via INTRAVENOUS

## 2018-11-28 MED ORDER — DEXAMETHASONE SODIUM PHOSPHATE 10 MG/ML IJ SOLN
INTRAMUSCULAR | Status: AC
  Filled 2018-11-28: qty 1

## 2018-11-28 MED ORDER — SODIUM CHLORIDE 0.9 % IV SOLN
20.0000 mg | Freq: Once | INTRAVENOUS | Status: AC
  Administered 2018-11-28: 20 mg via INTRAVENOUS
  Filled 2018-11-28: qty 2

## 2018-11-28 MED ORDER — PALONOSETRON HCL INJECTION 0.25 MG/5ML
0.2500 mg | Freq: Once | INTRAVENOUS | Status: AC
  Administered 2018-11-28: 10:00:00 0.25 mg via INTRAVENOUS

## 2018-11-28 MED ORDER — SODIUM CHLORIDE 0.9 % IV SOLN
Freq: Once | INTRAVENOUS | Status: AC
  Administered 2018-11-28: 10:00:00 via INTRAVENOUS
  Filled 2018-11-28: qty 250

## 2018-11-28 MED ORDER — DIPHENHYDRAMINE HCL 50 MG/ML IJ SOLN
INTRAMUSCULAR | Status: AC
  Filled 2018-11-28: qty 1

## 2018-11-28 MED ORDER — FAMOTIDINE IN NACL 20-0.9 MG/50ML-% IV SOLN: 20.0000 mg | Freq: Once | INTRAVENOUS | Status: DC

## 2018-11-28 MED ORDER — PALONOSETRON HCL INJECTION 0.25 MG/5ML
INTRAVENOUS | Status: AC
  Filled 2018-11-28: qty 5

## 2018-11-28 MED ORDER — ACETAMINOPHEN 325 MG PO TABS
650.0000 mg | ORAL_TABLET | Freq: Once | ORAL | Status: AC
  Administered 2018-11-28: 650 mg via ORAL

## 2018-11-28 MED ORDER — ACETAMINOPHEN 325 MG PO TABS
ORAL_TABLET | ORAL | Status: AC
  Filled 2018-11-28: qty 2

## 2018-11-28 MED ORDER — SODIUM CHLORIDE 0.9 % IV SOLN
358.5000 mg | Freq: Once | INTRAVENOUS | Status: AC
  Administered 2018-11-28: 360 mg via INTRAVENOUS
  Filled 2018-11-28: qty 36

## 2018-11-28 MED ORDER — SODIUM CHLORIDE 0.9 % IV SOLN
105.0000 mg/m2 | Freq: Once | INTRAVENOUS | Status: AC
  Administered 2018-11-28: 174 mg via INTRAVENOUS
  Filled 2018-11-28: qty 29

## 2018-11-28 MED ORDER — HEPARIN SOD (PORK) LOCK FLUSH 100 UNIT/ML IV SOLN
500.0000 [IU] | Freq: Once | INTRAVENOUS | Status: AC | PRN
  Administered 2018-11-28: 500 [IU]
  Filled 2018-11-28: qty 5

## 2018-11-28 MED ORDER — SODIUM CHLORIDE 0.9% FLUSH
10.0000 mL | INTRAVENOUS | Status: DC | PRN
  Administered 2018-11-28: 10 mL
  Filled 2018-11-28: qty 10

## 2018-11-28 MED ORDER — SODIUM CHLORIDE 0.9 % IV SOLN: 10.0000 mg | Freq: Once | INTRAVENOUS | Status: DC

## 2018-11-28 NOTE — Patient Instructions (Signed)
[facility] Discharge Instructions for Patients Receiving Chemotherapy  Today you received the following chemotherapy agents Paclitaxel (TAXOL) & Carboplatin (PARAPLATIN).  To help prevent nausea and vomiting after your treatment, we encourage you to take your nausea medication as prescribed.   If you develop nausea and vomiting that is not controlled by your nausea medication, call the clinic.   BELOW ARE SYMPTOMS THAT SHOULD BE REPORTED IMMEDIATELY:  *FEVER GREATER THAN 100.5 F  *CHILLS WITH OR WITHOUT FEVER  NAUSEA AND VOMITING THAT IS NOT CONTROLLED WITH YOUR NAUSEA MEDICATION  *UNUSUAL SHORTNESS OF BREATH  *UNUSUAL BRUISING OR BLEEDING  TENDERNESS IN MOUTH AND THROAT WITH OR WITHOUT PRESENCE OF ULCERS  *URINARY PROBLEMS  *BOWEL PROBLEMS  UNUSUAL RASH Items with * indicate a potential emergency and should be followed up as soon as possible.  Feel free to call the clinic should you have any questions or concerns. The clinic phone number is [phone].  Please show the CHEMO ALERT CARD at check-in to the Emergency Department and triage nurse.  Coronavirus (COVID-19) Are you at risk?  Are you at risk for the Coronavirus (COVID-19)?  To be considered HIGH RISK for Coronavirus (COVID-19), you have to meet the following criteria:  . Traveled to [place], [place], [place], [place] or [place]; or in the [place] to [place], [place], [place], or [place]; and have fever, cough, and shortness of breath within the last 2 weeks of travel OR . Been in close contact with a person diagnosed with COVID-19 within the last 2 weeks and have fever, cough, and shortness of breath . IF YOU DO NOT MEET THESE CRITERIA, YOU ARE CONSIDERED LOW RISK FOR COVID-19.  What to do if you are HIGH RISK for COVID-19?  . If you are having a medical emergency, call 911. . Seek medical care right away. Before you go to a doctor's office, urgent care or emergency department,  call ahead and tell them about your recent travel, contact with someone diagnosed with COVID-19, and your symptoms. You should receive instructions from your physician's office regarding next steps of care.  . When you arrive at healthcare provider, tell the healthcare staff immediately you have returned from visiting [place], [place], [place], [place] or [place]; or traveled in the [place] to [place], [place], [place], or [place]; in the last two weeks or you have been in close contact with a person diagnosed with COVID-19 in the last 2 weeks.   . Tell the health care staff about your symptoms: fever, cough and shortness of breath. . After you have been seen by a medical provider, you will be either: o Tested for (COVID-19) and discharged home on quarantine except to seek medical care if symptoms worsen, and asked to  - Stay home and avoid contact with others until you get your results (4-5 days)  - Avoid travel on public transportation if possible (such as bus, train, or airplane) or o Sent to the Emergency Department by EMS for evaluation, COVID-19 testing, and possible admission depending on your condition and test results.  What to do if you are LOW RISK for COVID-19?  Reduce your risk of any infection by using the same precautions used for avoiding the common cold or flu:  . Wash your hands often with soap and warm water for at least 20 seconds.  If soap and water are not readily available, use an alcohol-based hand sanitizer with at least 60% alcohol.  .   If coughing or sneezing, cover your mouth and nose by coughing or sneezing into the elbow areas of your shirt or coat, into a tissue or into your sleeve (not your hands). . Avoid shaking hands with others and consider head nods or verbal greetings only. . Avoid touching your eyes, nose, or mouth with unwashed hands.  . Avoid close contact with people who are sick. . Avoid places or events with large numbers of people in one  location, like concerts or sporting events. . Carefully consider travel plans you have or are making. . If you are planning any travel outside or inside the [place], visit the CDC's Travelers' Health webpage for the latest health notices. . If you have some symptoms but not all symptoms, continue to monitor at home and seek medical attention if your symptoms worsen. . If you are having a medical emergency, call 911.   ADDITIONAL HEALTHCARE OPTIONS FOR PATIENTS  [facility] / e-Visit: [email or web address]         [facility]: [phone]  [facility]: [phone]                   [facility]: [phone]   

## 2018-12-01 ENCOUNTER — Ambulatory Visit (INDEPENDENT_AMBULATORY_CARE_PROVIDER_SITE_OTHER): Payer: Medicare HMO | Admitting: Family Medicine

## 2018-12-01 ENCOUNTER — Encounter: Payer: Self-pay | Admitting: Family Medicine

## 2018-12-01 VITALS — BP 150/60 | HR 87 | Temp 98.0°F | Ht 65.0 in | Wt 137.0 lb

## 2018-12-01 DIAGNOSIS — E1121 Type 2 diabetes mellitus with diabetic nephropathy: Secondary | ICD-10-CM | POA: Diagnosis not present

## 2018-12-01 DIAGNOSIS — R809 Proteinuria, unspecified: Secondary | ICD-10-CM | POA: Diagnosis not present

## 2018-12-01 DIAGNOSIS — E1169 Type 2 diabetes mellitus with other specified complication: Secondary | ICD-10-CM

## 2018-12-01 DIAGNOSIS — E785 Hyperlipidemia, unspecified: Secondary | ICD-10-CM

## 2018-12-01 DIAGNOSIS — I10 Essential (primary) hypertension: Secondary | ICD-10-CM | POA: Diagnosis not present

## 2018-12-01 NOTE — Patient Instructions (Addendum)
24 hour urine collection for proteinuria. This will give [place] a better idea how much protein she is actually spilling. The benzapril drug will help protect her kidneys from this. If this continues, will need to send her to kidney doc for further work up. I usually get kidney ultrasound as well.   Dressing for wound is called mepilex. Can get on [facility] or [facility]. Can change every 2-3 days.   Keep blood pressure log and call us on Friday!   Continue with metformin once/day.

## 2018-12-01 NOTE — Progress Notes (Signed)
Patient: Katie Myers MRN: [id number] DOB: 1946/04/15 PCP: Katie Flaming, MD     Subjective:  Chief Complaint  Patient presents with  . Medication Management  . Hypertension  . Diabetes    HPI: The patient is a 73 y.o. female who presents today for blood pressure and diabetes follow up. She was seen at her oncologist's office last week and blood pressure was too high to receive chemo treatment. I advised her at that time to increase coreg to 6.25mg  Bid over the weekend and she was to follow up today. Will also address diabetes today.   Hypertension: Here for follow up of hypertension.  Currently on norvasc 10mg , coreg 6.25mg  BID and benzapril 40mg . Takes medication as prescribed and denies any side effects. Exercise includes none. Weight has been stable. Denies any chest pain, headaches, shortness of breath, vision changes, swelling in lower extremities. She is tired. Normally checks her pressures, but has not since increasing coreg.   Diabetes: Patient is here for follow up of type 2 diabetes. First diagnosed 43.  Currently on the following medications: metformin 500mg /day. Takes medications as prescribed. Last A1C was 4.7 on 11/27/2018. NOT currently exercising and following diabetic diet. Sugars range from 125 to 130. Denies any hypoglycemic events. Denies any vision changes, nausea, vomiting, abdominal pain, ulcers/paraesthesia in feet, polyuria, polydipsia or polyphagia. Denies any chest pain, shortness of breath.   Hyperlipidemia: exteremly well controlled. Currently on atorvastatin 40mg .   Proteinuria: 4d ago had 300mg /dl in her urine. Oncology thought this was due to either diabetes or HTN; however, her a1c is 4.7 and blood pressure has been decently well controlled in the past (per family).   Chronic pressure ulcer: located on her left greater tuberosity. It is scabbed over at this point, but fluctuates from being open to being scabbed. Happened when she lost all of her weight and  became more confined to her bed. They do turn her and she does get up and walk around. It bothers her when she rubs against the bed.    Review of Systems  Constitutional: Positive for fatigue and unexpected weight change.  HENT: Negative for congestion, sore throat and trouble swallowing.   Respiratory: Negative for cough and shortness of breath.   Cardiovascular: Negative for chest pain.  Gastrointestinal: Negative for abdominal pain and nausea.  Musculoskeletal: Negative for back pain and neck pain.  Neurological: Positive for headaches. Negative for dizziness.  Psychiatric/Behavioral: Positive for sleep disturbance. Negative for confusion. The patient is nervous/anxious.     Allergies Patient has No Known Allergies.  Past Medical History Patient  has a past medical history of Cancer ([place]), Diabetes mellitus without complication ([place]), Hyperlipidemia, Hypertension, Knee pain, bilateral, Neuropathy, Stroke ([place]), and Underweight.  Surgical History Patient  has a past surgical history that includes IR IMAGING GUIDED PORT INSERTION (11/05/2018).  Family History Pateint's family history includes Cancer in her maternal aunt, maternal grandfather, maternal uncle, and mother.  Social History Patient  reports that she has quit smoking. Her smoking use included cigarettes. She smoked 0.50 packs per day. She has never used smokeless tobacco. She reports that she does not drink alcohol or use drugs.    Objective: Vitals:   12/01/18 1058 12/01/18 1127  BP: 118/62 (!) 150/60  Pulse: 87   Temp: 98 F (36.7 C)   TempSrc: Oral   SpO2: 96%   Weight: 137 lb (62.1 kg)   Height: 5\' 5"  (1.651 m)     Body mass index is 22.8 kg/m.  Physical Exam Vitals signs reviewed.  Constitutional:      Appearance: Normal appearance.     Comments: In wheel chair   HENT:     Head: Normocephalic and atraumatic.  Neck:     Musculoskeletal: Normal range of motion and neck supple.  Cardiovascular:      Rate and Rhythm: Normal rate and regular rhythm.     Heart sounds: Murmur present.  Pulmonary:     Effort: Pulmonary effort is normal.     Breath sounds: Normal breath sounds.  Abdominal:     General: Abdomen is flat. Bowel sounds are normal.     Palpations: Abdomen is soft.  Skin:    General: Skin is warm.     Capillary Refill: Capillary refill takes less than 2 seconds.     Comments: Pressure ulcer on left greater trochanter. Stage I. Scabbed over with minimal surrounding erythema. About 2cm in diameter.   Neurological:     General: No focal deficit present.     Mental Status: She is alert and oriented to person, place, and time.  Psychiatric:        Mood and Affect: Mood normal.        Behavior: Behavior normal.        Assessment/plan: 1. Essential hypertension Repeat BP above goal. Just increased her coreg 3 days ago. Would like them to keep a log this week and see how her BP and HR are and call me Friday. Will adjust as needed at that point. They did tell me her BP has been well controlled in the past.   2. Controlled type 2 diabetes mellitus with diabetic nephropathy, without long-term current use of insulin (HCC) Continue metformin 500mg /day. a1c extremely tightly controlled for her age. Discussed after steroids are finished we could do trial off medication.   3. Proteinuria, unspecified type Will start with 24 hour collection and go from there as this is gold standard for proteinuria. Doubtful from diabetes unless uncontrolled for prolonged period in the past. ? If from HTN, but again family states has never been "out of control."  Creatinine clearance and GFR wnl. Discussed normally I would check renal ultrasound and refer to nephrology if 24 hour protein is abnormal. She seems very reluctant to do this.  - Protein, urine, 24 hour; Future  4. Hyperlipidemia associated with type 2 diabetes mellitus ([place]) extremely well controlled and to goal. Continue statin.     Return  if symptoms worsen or fail to improve.     Katie Flaming, MD [place]  12/01/2018

## 2018-12-05 ENCOUNTER — Telehealth: Payer: Self-pay

## 2018-12-05 NOTE — Telephone Encounter (Signed)
Called back and left a message with answering service to have someone call the office back. The office is closed and you can not leave a message for [place].

## 2018-12-05 NOTE — Telephone Encounter (Signed)
I suggest they refill enough until her next visit with me  Have them fax over their last clinical note

## 2018-12-05 NOTE — Telephone Encounter (Signed)
Morey Hummingbird called and left a message from [facility]. Cher Nakai, PA see's Ms. Folds. They are wanting to hand over pain management due to COVID, her pain and to keep her safe. She has enough pain medication to last until 4/20. They are requesting call back.

## 2018-12-08 ENCOUNTER — Other Ambulatory Visit: Payer: Self-pay

## 2018-12-08 ENCOUNTER — Other Ambulatory Visit: Payer: Medicare HMO

## 2018-12-08 DIAGNOSIS — R809 Proteinuria, unspecified: Secondary | ICD-10-CM

## 2018-12-08 NOTE — Telephone Encounter (Signed)
Left a message on answering service for return call.

## 2018-12-08 NOTE — Telephone Encounter (Signed)
Return call from [place] at [facility]. They will cover the prescription until 4/30. They requested a release signed by the patient to send the last clinical notes. I advised they are the ones requesting we see the patient to continue care for her pain management and we would not be able to do that if they can't send the proper clinical documentation. Katie Myers will put in the request. When the patient comes on 4/30 they are requesting the release to be sent to them at that time if possible.

## 2018-12-09 LAB — PROTEIN, URINE, 24 HOUR: Protein, 24H Urine: 355 mg/24 h — ABNORMAL HIGH (ref 0–149)

## 2018-12-10 ENCOUNTER — Telehealth: Payer: Self-pay | Admitting: Family Medicine

## 2018-12-10 DIAGNOSIS — R809 Proteinuria, unspecified: Secondary | ICD-10-CM

## 2018-12-10 DIAGNOSIS — E119 Type 2 diabetes mellitus without complications: Secondary | ICD-10-CM | POA: Insufficient documentation

## 2018-12-10 DIAGNOSIS — R808 Other proteinuria: Secondary | ICD-10-CM

## 2018-12-10 NOTE — Telephone Encounter (Signed)
Referral to nephrology for >3g/24hour proteinuria. Does have HTN/well controlled diabetes.  [name], MD [place]

## 2018-12-11 ENCOUNTER — Telehealth: Payer: Self-pay | Admitting: Family Medicine

## 2018-12-11 NOTE — Telephone Encounter (Signed)
Blood pressure log reviewed. BP are a little more labile than I would prefer, but overall to goal for her age. Scanned into chart. Will continue plan for now.   -have not heard from [facility]. Need to get this doing asap as having issues with her pressure ulcers. Would like HH to dress as they are mild and watch them in case I need to do wound care referral.   -need DPR for Katie Myers. Will mail or scan to her via email.   [name], MD [place]

## 2018-12-15 ENCOUNTER — Ambulatory Visit: Payer: Medicare HMO | Admitting: Family Medicine

## 2018-12-15 ENCOUNTER — Ambulatory Visit: Payer: Self-pay | Admitting: Family Medicine

## 2018-12-15 ENCOUNTER — Telehealth: Payer: Self-pay | Admitting: Family Medicine

## 2018-12-15 NOTE — Telephone Encounter (Signed)
See note

## 2018-12-15 NOTE — Telephone Encounter (Signed)
Copied from [place] [id number]. Topic: Quick Communication - Home Health Verbal Orders >> Dec 15, 2018 12:35 PM Margot Ables wrote: Caller/Agency: Ana PT with [facility] Number: [phone], secure VM Requesting OT/PT/Skilled Nursing/Social Work/Speech Therapy: PT & SN  Frequency: requesting VO for PT 1 week 1, 2 week 3, 1 week 1 & VO for SN eval for wound mgmt (pressure ulcers)

## 2018-12-15 NOTE — Telephone Encounter (Signed)
Okay to give verbal order for below listed [facility] services.

## 2018-12-15 NOTE — Telephone Encounter (Signed)
Ana given verbal order

## 2018-12-15 NOTE — Telephone Encounter (Signed)
FYI

## 2018-12-15 NOTE — Telephone Encounter (Signed)
Can we have [place] continue to keep a log this week then schedule Katie Myers a doxy visit later on in week to adjust medication again please?

## 2018-12-15 NOTE — Telephone Encounter (Addendum)
  Estill Bamberg, occupational therapist from [facility] called in to let Dr. Rogers Blocker know Ms. Brem's BP was a little high.   This is her first time seeing the pt so wasn't sure if this was near her normal or not. Estill Bamberg had already left the pt's house when she called me.   If there are new orders from Dr. Rogers Blocker then have her call the family.     She has a new cancer diagnosis per Estill Bamberg.    I sent these notes to Dr. Shelby Mattocks office.  I called the office and spoke with [facility].   I made her aware of the notes I had sent over.     Reason for Disposition . [6] Systolic BP  >= 294 OR Diastolic >= 80 AND [7] taking BP medications  Answer Assessment - Initial Assessment Questions 1. BLOOD PRESSURE: "What is the blood pressure?" "Did you take at least two measurements 5 minutes apart?"     Estill Bamberg, Occupational Therapist with [facility] called in for pt.   She was not with her now but had just left her house.   Her BP was 190/90 when Estill Bamberg first got there.   She did some relaxation with Ms. Diop and the BP was 178/90 when she left the house.    Pt is not having any symptoms or complaints.   Amanda left her laying down with her feet propped up for comfort. 2. ONSET: "When did you take your blood pressure?"     Within the last 1/2 hour.  Estill Bamberg had just left the house. 3. HOW: "How did you obtain the blood pressure?" (e.g., visiting nurse, automatic home BP monitor)     Occupational therapist took it.   Not sure what kind of machine or manually  done. 4. HISTORY: "Do you have a history of high blood pressure?"     Yes   She took her medications this morning. 5. MEDICATIONS: "Are you taking any medications for blood pressure?" "Have you missed any doses recently?"     Yes   She took her meds this morning 6. OTHER SYMPTOMS: "Do you have any symptoms?" (e.g., headache, chest pain, blurred vision, difficulty breathing, weakness)     No 7. PREGNANCY: "Is there any chance you are pregnant?" "When was your last  menstrual period?"     N/A  Protocols used: HIGH BLOOD PRESSURE-A-AH

## 2018-12-16 ENCOUNTER — Other Ambulatory Visit: Payer: Self-pay | Admitting: Family Medicine

## 2018-12-16 ENCOUNTER — Telehealth: Payer: Self-pay | Admitting: Family Medicine

## 2018-12-16 NOTE — Telephone Encounter (Signed)
Copied from [place] [id number]. Topic: Quick Communication - Home Health Verbal Orders >> Dec 16, 2018  1:22 PM Yvette Rack wrote: Caller/Agency: Kim with [facility] Callback Number: [phone] Requesting OT/PT/Skilled Nursing/Social Work/Speech Therapy: skilled nursing  Frequency: 2 times a week for 7 weeks for wound care

## 2018-12-16 NOTE — Telephone Encounter (Signed)
See note

## 2018-12-16 NOTE — Telephone Encounter (Signed)
Copied from [place] [id number]. Topic: Quick Communication - Rx Refill/Question >> Dec 16, 2018 10:06 AM Keene Breath wrote: Medication: amLODipine (NORVASC) 10 MG tablet  Patient called to request a refill for the above medication  Preferred Pharmacy (with phone number or street name): [facility] [address], [place] - [address] [phone] (Phone) [phone] (Fax)

## 2018-12-16 NOTE — Telephone Encounter (Signed)
Katie Myers would like a call back [phone]

## 2018-12-17 MED ORDER — AMLODIPINE BESYLATE 10 MG PO TABS: 10.0000 mg | ORAL_TABLET | Freq: Every day | ORAL | 1 refills | Status: AC

## 2018-12-17 NOTE — Telephone Encounter (Signed)
Notified [name] voices understanding,Appt scheduled.

## 2018-12-17 NOTE — Telephone Encounter (Signed)
Refilled

## 2018-12-17 NOTE — Telephone Encounter (Signed)
Called and spoke with [place]. Holding PT right now due to patient intolerance. Gave verbal order for skilled nursing as requested by kim.   Bp reading yesterday was 154/78. Will schedule doxy this week to adjust medication.   [name], MD [place]

## 2018-12-18 ENCOUNTER — Telehealth: Payer: Self-pay | Admitting: Oncology

## 2018-12-18 ENCOUNTER — Inpatient Hospital Stay: Payer: Medicare HMO

## 2018-12-18 ENCOUNTER — Encounter: Payer: Self-pay | Admitting: Hematology and Oncology

## 2018-12-18 ENCOUNTER — Other Ambulatory Visit: Payer: Self-pay

## 2018-12-18 ENCOUNTER — Inpatient Hospital Stay (HOSPITAL_BASED_OUTPATIENT_CLINIC_OR_DEPARTMENT_OTHER): Payer: Medicare HMO | Admitting: Hematology and Oncology

## 2018-12-18 VITALS — BP 157/64 | HR 97 | Temp 98.4°F | Resp 18 | Ht 65.0 in

## 2018-12-18 DIAGNOSIS — M170 Bilateral primary osteoarthritis of knee: Secondary | ICD-10-CM

## 2018-12-18 DIAGNOSIS — Z7189 Other specified counseling: Secondary | ICD-10-CM

## 2018-12-18 DIAGNOSIS — C539 Malignant neoplasm of cervix uteri, unspecified: Secondary | ICD-10-CM | POA: Diagnosis not present

## 2018-12-18 DIAGNOSIS — E1165 Type 2 diabetes mellitus with hyperglycemia: Secondary | ICD-10-CM

## 2018-12-18 DIAGNOSIS — C7800 Secondary malignant neoplasm of unspecified lung: Secondary | ICD-10-CM | POA: Diagnosis not present

## 2018-12-18 DIAGNOSIS — E1140 Type 2 diabetes mellitus with diabetic neuropathy, unspecified: Secondary | ICD-10-CM

## 2018-12-18 DIAGNOSIS — I10 Essential (primary) hypertension: Secondary | ICD-10-CM

## 2018-12-18 DIAGNOSIS — G8929 Other chronic pain: Secondary | ICD-10-CM

## 2018-12-18 DIAGNOSIS — K5909 Other constipation: Secondary | ICD-10-CM

## 2018-12-18 DIAGNOSIS — Z5111 Encounter for antineoplastic chemotherapy: Secondary | ICD-10-CM | POA: Diagnosis not present

## 2018-12-18 DIAGNOSIS — R5381 Other malaise: Secondary | ICD-10-CM

## 2018-12-18 DIAGNOSIS — IMO0002 Reserved for concepts with insufficient information to code with codable children: Secondary | ICD-10-CM

## 2018-12-18 DIAGNOSIS — M25561 Pain in right knee: Secondary | ICD-10-CM

## 2018-12-18 DIAGNOSIS — R808 Other proteinuria: Secondary | ICD-10-CM

## 2018-12-18 LAB — CMP (CANCER CENTER ONLY)
ALT: 17 U/L (ref 0–44)
AST: 31 U/L (ref 15–41)
Albumin: 2.9 g/dL — ABNORMAL LOW (ref 3.5–5.0)
Alkaline Phosphatase: 89 U/L (ref 38–126)
Anion gap: 11 (ref 5–15)
BUN: 9 mg/dL (ref 8–23)
CO2: 27 mmol/L (ref 22–32)
Calcium: 8.7 mg/dL — ABNORMAL LOW (ref 8.9–10.3)
Chloride: 98 mmol/L (ref 98–111)
Creatinine: 0.7 mg/dL (ref 0.44–1.00)
GFR, Est AFR Am: >60 mL/min (ref >60)
GFR, Estimated: >60 mL/min (ref >60)
Glucose, Bld: 194 mg/dL — ABNORMAL HIGH (ref 70–99)
Potassium: 3.8 mmol/L (ref 3.5–5.1)
Sodium: 136 mmol/L (ref 135–145)
Total Bilirubin: 0.4 mg/dL (ref 0.3–1.2)
Total Protein: 8.2 g/dL — ABNORMAL HIGH (ref 6.5–8.1)

## 2018-12-18 LAB — CBC WITH DIFFERENTIAL (CANCER CENTER ONLY)
Abs Immature Granulocytes: 0.03 10*3/uL (ref 0.00–0.07)
Basophils Absolute: 0 10*3/uL (ref 0.0–0.1)
Basophils Relative: 1 %
Eosinophils Absolute: 0 10*3/uL (ref 0.0–0.5)
Eosinophils Relative: 0 %
HCT: 31.1 % — ABNORMAL LOW (ref 36.0–46.0)
Hemoglobin: 9.8 g/dL — ABNORMAL LOW (ref 12.0–15.0)
Immature Granulocytes: 0 %
Lymphocytes Relative: 19 %
Lymphs Abs: 1.4 10*3/uL (ref 0.7–4.0)
MCH: 30.5 pg (ref 26.0–34.0)
MCHC: 31.5 g/dL (ref 30.0–36.0)
MCV: 96.9 fL (ref 80.0–100.0)
Monocytes Absolute: 0.6 10*3/uL (ref 0.1–1.0)
Monocytes Relative: 8 %
Neutro Abs: 5.4 10*3/uL (ref 1.7–7.7)
Neutrophils Relative %: 72 %
Platelet Count: 416 10*3/uL — ABNORMAL HIGH (ref 150–400)
RBC: 3.21 MIL/uL — ABNORMAL LOW (ref 3.87–5.11)
RDW: 16.9 % — ABNORMAL HIGH (ref 11.5–15.5)
WBC Count: 7.4 10*3/uL (ref 4.0–10.5)
nRBC: 0 % (ref 0.0–0.2)

## 2018-12-18 LAB — SAMPLE TO BLOOD BANK

## 2018-12-18 MED ORDER — HYDROMORPHONE HCL 2 MG PO TABS: 2.0000 mg | ORAL_TABLET | ORAL | 0 refills | Status: DC | PRN

## 2018-12-18 MED ORDER — METHADONE HCL 10 MG PO TABS: 10.0000 mg | ORAL_TABLET | Freq: Two times a day (BID) | ORAL | 0 refills | Status: DC

## 2018-12-18 MED FILL — METHADONE HCL 10 MG TABLET: 10 | 15 days supply | Qty: 30 | Fill #0

## 2018-12-18 MED FILL — HYDROmorphone HCL 2 MG TABS: 2 | 5 days supply | Qty: 30 | Fill #0

## 2018-12-18 NOTE — Assessment & Plan Note (Signed)
She has severe, uncontrolled hypertension and this caused significant heavy proteinuria, along with her diabetes Unfortunately, we are not able to proceed with bevacizumab We will proceed with chemotherapy as scheduled

## 2018-12-18 NOTE — Progress Notes (Signed)
[facility] OFFICE PROGRESS NOTE  Patient Care Team: [name], MD as PCP - General (Family Medicine) Polly Cobia, Dayton Bailiff, MD as Referring Physician (Obstetrics and Gynecology) Awanda Mink Craige Cotta, RN as Oncology Nurse Navigator (Oncology) Heath Lark, MD as Consulting Physician (Hematology and Oncology)  ASSESSMENT & PLAN:  Cervical cancer [facility]) She tolerated chemotherapy well but has significant unrelated issues such as uncontrolled pain and hypertension With recent dose adjustment of chemotherapy, she denies worsening peripheral neuropathy We will proceed with cycle 3 of chemotherapy as scheduled tomorrow I plan to see her back next week for pain management I attempted to call her GYN oncologist to set up follow-up appointment but unable to do so today If her GYN oncologist is not able to see her back, I will order repeat PET CT scan for objective assessment of response to therapy  Metastasis to lung [facility]) She is not symptomatic She denies cough, chest pain or shortness of breath We will order imaging study as above.  Essential hypertension She has severe, uncontrolled hypertension and this caused significant heavy proteinuria, along with her diabetes Unfortunately, we are not able to proceed with bevacizumab We will proceed with chemotherapy as scheduled  Other proteinuria This is multifactorial, likely due to uncontrolled hypertension along with diabetes She is being referred to follow with nephrologist We will not proceed with bevacizumab at this point  Uncontrolled type 2 diabetes mellitus with diabetic neuropathy, without long-term current use of insulin ([place]) She denies worsening peripheral neuropathy since recent dose adjustment We will continue reduced dose chemotherapy and proceed as scheduled  Chronic pain of right knee She has severe, uncontrolled right knee pain She follows at the pain clinic for chronic pain management up until recently We  discussed chronic pain management, and related to side effects of chemotherapy, in the setting of terminal illness She is comfortable for me to manage her pain medication I have discontinue her Percocet because I do not like combo medication with acetaminophen Due to severe, uncontrolled pain while on Percocet, I recommend addition of long-acting pain medicine with methadone along with Dilaudid as needed We discussed risk of sedation, nausea and constipation while on pain medicine I will see her back next week for further assessment of pain control  Other constipation With aggressive laxative therapy, her constipation has improved but with plan to change her pain medication now, I expect she might have severe constipation again I recommend daily laxative therapy   No orders of the defined types were placed in this encounter.   INTERVAL HISTORY: Please see below for problem oriented charting. She returns for further follow-up, to be seen prior to cycle 3 of chemotherapy Her daughter is available to collaborate her history over the telephone Apparently, the pain clinic has transferred her care to me without further discussion She complained of severe, uncontrolled pain, 10 out of 10 on the right knee, worse in the left Currently, she takes 10 mg of Percocet every 4 hours or so and it barely touches the pain She denies recent fall She has this chronic knee pain for a long time even before her cancer treatment With recent dose adjustment, she denies worsening peripheral neuropathy Her bowel habits are somewhat regular with regular laxative therapy She denies recent fever or chills No cough The patient denies any recent signs or symptoms of bleeding such as spontaneous epistaxis, hematuria or hematochezia.  SUMMARY OF ONCOLOGIC HISTORY:   Cervical cancer ([place])   12/26/2017 Initial Diagnosis  She presented to the ER with symptomatic anemia and had postmenopausal bleeding. Bimanual exam at  that time revealed abnormal exam    12/26/2017 Imaging    US pelvis Limited exam due to difficulty with visualization of the uterus and endometrium.  Calcified submucosal fibroid.  Endometrium measures 10 mm. Endometrium is poorly visualized and not adequately assessed. If bleeding remains unresponsive to hormonal or medical therapy, sonohysterogram should be considered for focal lesion work-up    05/29/2018 Pathology Results    1.  Endometrium, biopsy: - Atypical squamous epithelium with dysplastic features highly suspicious for malignancy.  Note: The biopsy specimen shows squamous epithelium and marked acute inflammation with associated individual squamous cells suggestive of tumor diathesis.  These individual squamous cells show koilocytic changes with cytological features approaching high-grade dysplasia.  Rare keratin pearls are identified.  Areas of more intact squamous epithelium show koilocytic changes, dyskeratosis, and areas of increased mitotic activity (although tissue orientation precludes definitive determination of significance of this finding).  The specimen is negative for endometrial glands.    The clinical findings of a normal cervical exam and upper vaginal nodularity is noted.  Imaging findings of an abnormal uterus are also noted.  The overall findings are extremely concerning for a squamous cell carcinoma, with the differential including vaginal, uterine, or cervical in origin.  A block has been ordered for additional p16 immunohistochemical studies. If clinically indicated, additional tissue sampling may be useful for further diagnosis/classification.  An immunohistochemical stain for p16 is positive (strong blocklike reactivity) on intact fragments of squamous epithelium as well as on detached individual squamous cells. All controls showed appropriate reactivity.   Typically primary squamous cell carcinomas of the uterus are p16-negative, therefore the current positive  p16 likely reflects an HPV-driven malignancy of cervical or vaginal origin.  High grade dysplasia with individual cells seen representing an element of degeneration in this sample is also technically possible and remains in the differential, however given the clinical and imaging findings I think this is less likely.  Additional clinical and radiological correlation recommended.    07/23/2018 Imaging    CT abdomen and pelvis IMPRESSION: 1. Abnormal uterus with gas in the endometrial cavity, multiple calcified fibroids, and a right rim-enhancing uterine fluid collection measuring 3.3 x 3.5 cm concerning for abscess image 128. This is contiguous with an edematous anterior abdominal wall.  There is soft tissue extension from the uterus to the right which may represent tumor. The uterus is overall poorly defined.  2. Pelvic, retroperitoneal, and portal adenopathy 3. Lung nodules as above. CT chest may be of benefit here. 4. Coronary and aortic ASVD 5. Cholelithiasis 6. Bilateral renal cortical cyst 7. Aortoiliac atherosclerosis without aneurysm 8. Diverticulosis without diverticulitis 10. Fecal retention    09/09/2018 Pathology Results    1. 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  2. 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  3. Endometrium, curettage:  -Fragments of squamous cell carcinoma with areas of invasion, see comment.  4.  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  COMMENT: The biopsies all show fragments of squamous neoplasm ranging from squamous cell carcinoma in situ to areas of invasive carcinoma.  The previous biopsy (case [id number]) showed strong positive p16 staining.  This, coupled with the clinical impression and the vaginal biopsy, is consistent with a vaginal/cervical origin for this tumor.  The endometrial  curettage shows no evidence of endometrial tissue.    10/21/2018 PET scan    1. Extensive abnormal activity throughout the uterus, consistent with malignancy. 2. Hypermetabolic malignant-appearing retroperitoneal, external iliac, and inguinal adenopathy.  3. Numerous bilateral pulmonary nodules, most of which are below PET resolution with the largest demonstrating low-level activity. These are indeterminate, but suspicious for metastatic disease. 4. Numerous enlarged and hypermetabolic mediastinal and bilateral hilar lymph nodes, concerning for metastatic disease.  5. Small lymph node adjacent to the left thyroid lobe with low level FDG activity is nonspecific.    10/23/2018 Cancer Staging    Staging form: Cervix Uteri, AJCC 8th Edition - Clinical: FIGO Stage IVB (cT2, cN1, cM1) - Signed by Heath Lark, MD on 10/23/2018    11/05/2018 Procedure    Successful placement of a right IJ approach Power Port with ultrasound and fluoroscopic guidance. The catheter is ready for use.    11/07/2018 -  Chemotherapy    The patient had carboplatin and taxol     REVIEW OF SYSTEMS:   Constitutional: Denies fevers, chills or abnormal weight loss Eyes: Denies blurriness of vision Ears, nose, mouth, throat, and face: Denies mucositis or sore throat Respiratory: Denies cough, dyspnea or wheezes Cardiovascular: Denies palpitation, chest discomfort or lower extremity swelling Skin: Denies abnormal skin rashes Lymphatics: Denies new lymphadenopathy or easy bruising Behavioral/Psych: Mood is stable, no new changes  All other systems were reviewed with the patient and are negative.  I have reviewed the past medical history, past surgical history, social history and family history with the patient and they are unchanged from previous note.  ALLERGIES:  has No Known Allergies.  MEDICATIONS:  Current Outpatient Medications  Medication Sig Dispense Refill  . amLODipine (NORVASC) 10 MG tablet Take 1 tablet (10  mg total) by mouth daily. 90 tablet 1  . atorvastatin (LIPITOR) 40 MG tablet Take 40 mg by mouth daily.    . benazepril (LOTENSIN) 40 MG tablet Take 40 mg by mouth daily.    . carvedilol (COREG) 6.25 MG tablet Take 1 tablet (6.25 mg total) by mouth 2 (two) times daily with a meal. 60 tablet 3  . dexamethasone (DECADRON) 4 MG tablet Take 2 tabs at the night before chemotherapy, every 3 weeks, by mouth with food 24 tablet 0  . DULoxetine (CYMBALTA) 20 MG capsule Take 1 capsule (20 mg total) by mouth daily. Take at night time. 30 capsule 3  . HYDROmorphone (DILAUDID) 2 MG tablet Take 1 tablet (2 mg total) by mouth every 4 (four) hours as needed for severe pain. 30 tablet 0  . Lancets (ONETOUCH DELICA PLUS [id number]) MISC     . lidocaine-prilocaine (EMLA) cream Apply to affected area once 30 g 3  . Liniments (SALONPAS PAIN RELIEF PATCH EX) Apply 1 patch topically daily as needed (pain).    . metFORMIN (GLUCOPHAGE) 500 MG tablet Take 500 mg by mouth daily.    . methadone (DOLOPHINE) 10 MG tablet Take 1 tablet (10 mg total) by mouth every 12 (twelve) hours. 30 tablet 0  . Multiple Vitamins-Minerals (MULTIVITAMIN WITH MINERALS) tablet Take 1 tablet by mouth daily.    . ondansetron (ZOFRAN) 8 MG tablet Take 1 tablet (8 mg total) by mouth every 8 (eight) hours as needed for refractory nausea / vomiting. Start on day 3 after chemo. 30 tablet 1  . prochlorperazine (COMPAZINE) 10 MG tablet Take 1 tablet (10 mg total) by mouth every 6 (six) hours as needed (Nausea or vomiting). 30 tablet  1  . vitamin B-12 (CYANOCOBALAMIN) 1000 MCG tablet Take 1,000 mcg by mouth daily.     No current facility-administered medications for this visit.     PHYSICAL EXAMINATION: ECOG PERFORMANCE STATUS: 2 - Symptomatic, <50% confined to bed  Vitals:   12/18/18 1145  BP: (!) 157/64  Pulse: 97  Resp: 18  Temp: 98.4 F (36.9 C)  SpO2: 98%   Filed Weights    GENERAL:alert, no distress and comfortable.  She looks thin.   She sits on the wheelchair SKIN: skin color, texture, turgor are normal, no rashes or significant lesions EYES: normal, Conjunctiva are pink and non-injected, sclera clear OROPHARYNX:no exudate, no erythema and lips, buccal mucosa, and tongue normal  NECK: supple, thyroid normal size, non-tender, without nodularity LYMPH:  no palpable lymphadenopathy in the cervical, axillary or inguinal LUNGS: clear to auscultation and percussion with normal breathing effort HEART: regular rate & rhythm and no murmurs with bilateral lower extremity edema ABDOMEN:abdomen soft, non-tender and normal bowel sounds Musculoskeletal:no cyanosis of digits and no clubbing  NEURO: alert & oriented x 3 with fluent speech, no focal motor/sensory deficits  LABORATORY DATA:  I have reviewed the data as listed    Component Value Date/Time   NA 136 12/18/2018 1118   K 3.8 12/18/2018 1118   CL 98 12/18/2018 1118   CO2 27 12/18/2018 1118   GLUCOSE 194 (H) 12/18/2018 1118   BUN 9 12/18/2018 1118   CREATININE 0.70 12/18/2018 1118   CALCIUM 8.7 (L) 12/18/2018 1118   PROT 8.2 (H) 12/18/2018 1118   ALBUMIN 2.9 (L) 12/18/2018 1118   AST 31 12/18/2018 1118   ALT 17 12/18/2018 1118   ALKPHOS 89 12/18/2018 1118   BILITOT 0.4 12/18/2018 1118   GFRNONAA >60 12/18/2018 1118   GFRAA >60 12/18/2018 1118    No results found for: SPEP, UPEP  Lab Results  Component Value Date   WBC 7.4 12/18/2018   NEUTROABS 5.4 12/18/2018   HGB 9.8 (L) 12/18/2018   HCT 31.1 (L) 12/18/2018   MCV 96.9 12/18/2018   PLT 416 (H) 12/18/2018      Chemistry      Component Value Date/Time   NA 136 12/18/2018 1118   K 3.8 12/18/2018 1118   CL 98 12/18/2018 1118   CO2 27 12/18/2018 1118   BUN 9 12/18/2018 1118   CREATININE 0.70 12/18/2018 1118      Component Value Date/Time   CALCIUM 8.7 (L) 12/18/2018 1118   ALKPHOS 89 12/18/2018 1118   AST 31 12/18/2018 1118   ALT 17 12/18/2018 1118   BILITOT 0.4 12/18/2018 1118       All  questions were answered. The patient knows to call the clinic with any problems, questions or concerns. No barriers to learning was detected.  I spent 30 minutes counseling the patient face to face. The total time spent in the appointment was 40 minutes and more than 50% was on counseling and review of test results  Heath Lark, MD 12/18/2018 2:03 PM

## 2018-12-18 NOTE — Assessment & Plan Note (Signed)
With aggressive laxative therapy, her constipation has improved but with plan to change her pain medication now, I expect she might have severe constipation again I recommend daily laxative therapy

## 2018-12-18 NOTE — Telephone Encounter (Signed)
Left a message for Dr. Shelba Flake nurse, Ander Purpura, regarding scheduling patient for a PET scan and follow up with Dr. Polly Cobia after 3rd cycle of chemotherapy.  Requested a return call.

## 2018-12-18 NOTE — Assessment & Plan Note (Signed)
She has severe, uncontrolled right knee pain She follows at the pain clinic for chronic pain management up until recently We discussed chronic pain management, and related to side effects of chemotherapy, in the setting of terminal illness She is comfortable for me to manage her pain medication I have discontinue her Percocet because I do not like combo medication with acetaminophen Due to severe, uncontrolled pain while on Percocet, I recommend addition of long-acting pain medicine with methadone along with Dilaudid as needed We discussed risk of sedation, nausea and constipation while on pain medicine I will see her back next week for further assessment of pain control

## 2018-12-18 NOTE — Assessment & Plan Note (Signed)
She tolerated chemotherapy well but has significant unrelated issues such as uncontrolled pain and hypertension With recent dose adjustment of chemotherapy, she denies worsening peripheral neuropathy We will proceed with cycle 3 of chemotherapy as scheduled tomorrow I plan to see her back next week for pain management I attempted to call her GYN oncologist to set up follow-up appointment but unable to do so today If her GYN oncologist is not able to see her back, I will order repeat PET CT scan for objective assessment of response to therapy

## 2018-12-18 NOTE — Assessment & Plan Note (Signed)
She is not symptomatic She denies cough, chest pain or shortness of breath We will order imaging study as above.

## 2018-12-18 NOTE — Assessment & Plan Note (Signed)
She denies worsening peripheral neuropathy since recent dose adjustment We will continue reduced dose chemotherapy and proceed as scheduled

## 2018-12-18 NOTE — Telephone Encounter (Signed)
[name] from Dr. Shelba Flake office called back and said Katie Myers had an appointment this morning to see Dr. Polly Cobia but was a no show.  They have rescheduled her for next Thursday, 12/25/18 and will schedule the PET then.

## 2018-12-18 NOTE — Assessment & Plan Note (Signed)
This is multifactorial, likely due to uncontrolled hypertension along with diabetes She is being referred to follow with nephrologist We will not proceed with bevacizumab at this point

## 2018-12-19 ENCOUNTER — Inpatient Hospital Stay: Payer: Medicare HMO | Attending: Hematology and Oncology

## 2018-12-19 ENCOUNTER — Other Ambulatory Visit: Payer: Self-pay

## 2018-12-19 ENCOUNTER — Encounter: Payer: Self-pay | Admitting: Oncology

## 2018-12-19 VITALS — BP 170/74 | HR 97 | Temp 99.4°F | Resp 16

## 2018-12-19 DIAGNOSIS — K5909 Other constipation: Secondary | ICD-10-CM | POA: Insufficient documentation

## 2018-12-19 DIAGNOSIS — G8929 Other chronic pain: Secondary | ICD-10-CM | POA: Diagnosis not present

## 2018-12-19 DIAGNOSIS — Z79899 Other long term (current) drug therapy: Secondary | ICD-10-CM | POA: Insufficient documentation

## 2018-12-19 DIAGNOSIS — Z5111 Encounter for antineoplastic chemotherapy: Secondary | ICD-10-CM | POA: Diagnosis not present

## 2018-12-19 DIAGNOSIS — Z7189 Other specified counseling: Secondary | ICD-10-CM

## 2018-12-19 DIAGNOSIS — C539 Malignant neoplasm of cervix uteri, unspecified: Secondary | ICD-10-CM | POA: Diagnosis present

## 2018-12-19 DIAGNOSIS — M25561 Pain in right knee: Secondary | ICD-10-CM | POA: Insufficient documentation

## 2018-12-19 MED ORDER — HEPARIN SOD (PORK) LOCK FLUSH 100 UNIT/ML IV SOLN
500.0000 [IU] | Freq: Once | INTRAVENOUS | Status: AC | PRN
  Administered 2018-12-19: 500 [IU]
  Filled 2018-12-19: qty 5

## 2018-12-19 MED ORDER — DEXAMETHASONE SODIUM PHOSPHATE 10 MG/ML IJ SOLN
INTRAMUSCULAR | Status: AC
  Filled 2018-12-19: qty 1

## 2018-12-19 MED ORDER — PALONOSETRON HCL INJECTION 0.25 MG/5ML
0.2500 mg | Freq: Once | INTRAVENOUS | Status: AC
  Administered 2018-12-19: 0.25 mg via INTRAVENOUS

## 2018-12-19 MED ORDER — FAMOTIDINE IN NACL 20-0.9 MG/50ML-% IV SOLN
20.0000 mg | Freq: Once | INTRAVENOUS | Status: AC
  Administered 2018-12-19: 20 mg via INTRAVENOUS

## 2018-12-19 MED ORDER — SODIUM CHLORIDE 0.9 % IV SOLN
358.5000 mg | Freq: Once | INTRAVENOUS | Status: AC
  Administered 2018-12-19: 360 mg via INTRAVENOUS
  Filled 2018-12-19: qty 36

## 2018-12-19 MED ORDER — DIPHENHYDRAMINE HCL 50 MG/ML IJ SOLN
INTRAMUSCULAR | Status: AC
  Filled 2018-12-19: qty 1

## 2018-12-19 MED ORDER — SODIUM CHLORIDE 0.9 % IV SOLN
105.0000 mg/m2 | Freq: Once | INTRAVENOUS | Status: AC
  Administered 2018-12-19: 174 mg via INTRAVENOUS
  Filled 2018-12-19: qty 29

## 2018-12-19 MED ORDER — DIPHENHYDRAMINE HCL 50 MG/ML IJ SOLN
25.0000 mg | Freq: Once | INTRAMUSCULAR | Status: AC
  Administered 2018-12-19: 25 mg via INTRAVENOUS

## 2018-12-19 MED ORDER — DEXAMETHASONE SODIUM PHOSPHATE 10 MG/ML IJ SOLN
10.0000 mg | Freq: Once | INTRAMUSCULAR | Status: AC
  Administered 2018-12-19: 10 mg via INTRAVENOUS

## 2018-12-19 MED ORDER — PALONOSETRON HCL INJECTION 0.25 MG/5ML
INTRAVENOUS | Status: AC
  Filled 2018-12-19: qty 5

## 2018-12-19 MED ORDER — FAMOTIDINE IN NACL 20-0.9 MG/50ML-% IV SOLN
INTRAVENOUS | Status: AC
  Filled 2018-12-19: qty 50

## 2018-12-19 MED ORDER — SODIUM CHLORIDE 0.9% FLUSH
10.0000 mL | INTRAVENOUS | Status: DC | PRN
  Administered 2018-12-19: 10 mL
  Filled 2018-12-19: qty 10

## 2018-12-19 MED ORDER — SODIUM CHLORIDE 0.9 % IV SOLN
Freq: Once | INTRAVENOUS | Status: AC
  Administered 2018-12-19: 10:00:00 via INTRAVENOUS
  Filled 2018-12-19: qty 250

## 2018-12-22 ENCOUNTER — Ambulatory Visit (INDEPENDENT_AMBULATORY_CARE_PROVIDER_SITE_OTHER): Payer: Medicare HMO | Admitting: Family Medicine

## 2018-12-22 ENCOUNTER — Encounter: Payer: Self-pay | Admitting: Family Medicine

## 2018-12-22 VITALS — BP 146/69 | HR 96 | Ht 65.0 in | Wt 145.0 lb

## 2018-12-22 DIAGNOSIS — I10 Essential (primary) hypertension: Secondary | ICD-10-CM | POA: Diagnosis not present

## 2018-12-22 MED ORDER — CARVEDILOL 12.5 MG PO TABS: 12.5000 mg | ORAL_TABLET | Freq: Two times a day (BID) | ORAL | 1 refills | Status: AC

## 2018-12-22 NOTE — Progress Notes (Signed)
Patient: Katie Myers MRN: [id number] DOB: 1946-01-24 PCP: [name], MD     I connected with Katie Myers on 12/22/18 at 11:35am by a video enabled telemedicine application and verified that I am speaking with the correct person using two identifiers.  Location patient: Home Location provider: [facility], Office Persons participating in this virtual visit: Adeja Sarratt, [name] (niece)   And Dr. Rogers Blocker   I discussed the limitations of evaluation and management by telemedicine and the availability of in person appointments. The patient expressed understanding and agreed to proceed.   Subjective:  Chief Complaint  Patient presents with  . Hypertension    HPI: The patient is a 73 y.o. female who presents today for hypertension follow up. Home health has been following and reporting consistently high readings that are far from what they have gotten with their home cuff.   Hypertension: Here for follow up of hypertension.  Currently on norvasc 10mg , benzapril 40mg  and coreg 6.25 BID. Pulses have been in the 80-90s. No issues with increase in coreg and other medication. Home readings range from 287-867 EHMCNOBS/96-28 diastolic. Takes medication as prescribed and denies any side effects.  Weight has been stable. Denies any chest pain, headaches, shortness of breath, vision changes, swelling in lower extremities. We increased her to 6.25mg  of coreg in April and her home readings were much closer to goal. [name] states that her oncologist just changed her pain medication and this has had a tremendous beneficial effect. She is up walking and her appetite has drastically improved. They wonder if some of her HTN is from the pain. I now suspect her proteinuria is from uncontrolled HTN despite the family telling me it's been well controlled.    Review of Systems  Constitutional: Negative for fatigue.  Respiratory: Negative for shortness of breath.   Cardiovascular: Negative for chest pain.   Gastrointestinal: Negative for abdominal pain and nausea.  Musculoskeletal: Negative for back pain and neck pain.  Neurological: Negative for dizziness and headaches.  Psychiatric/Behavioral: Negative for sleep disturbance.    Allergies Patient has No Known Allergies.  Past Medical History Patient  has a past medical history of Cancer ([place]), Diabetes mellitus without complication ([place]), Hyperlipidemia, Hypertension, Knee pain, bilateral, Neuropathy, Stroke ([place]), and Underweight.  Surgical History Patient  has a past surgical history that includes IR IMAGING GUIDED PORT INSERTION (11/05/2018).  Family History Pateint's family history includes Cancer in her maternal aunt, maternal grandfather, maternal uncle, and mother.  Social History Patient  reports that she has quit smoking. Her smoking use included cigarettes. She smoked 0.50 packs per day. She has never used smokeless tobacco. She reports that she does not drink alcohol or use drugs.    Objective: Vitals:   12/22/18 1131  BP: (!) 146/69  Pulse: 96  Weight: 145 lb (65.8 kg)  Height: 5\' 5"  (1.651 m)    Body mass index is 24.13 kg/m.  Physical Exam Vitals signs reviewed.  Constitutional:      Comments: Cachetic   HENT:     Head: Normocephalic and atraumatic.  Pulmonary:     Effort: Pulmonary effort is normal.  Neurological:     General: No focal deficit present.     Mental Status: She is alert and oriented to person, place, and time.  Psychiatric:        Mood and Affect: Mood normal.        Behavior: Behavior normal.        Assessment/plan: 1. Essential  hypertension Increasing her coreg to 12.5mg  BID. HR has been good and should be able to tolerate. Already on ace-I/CCB. Proteinuria likely from uncontrolled htn. Already have done 24 hour urine with >3g/protein. Nephrology referral placed and will check to see why have not been contacted. Already in touch with referral specialist. Will have home health continue  to contact me with log readings. Also advised they get a new cuff when they can as well as I do not feel like theirs is adequate. Let me know if any issues, otherwise will see her back on doxy/telehealth visit in 2 weeks for bp follow up. Sent in new px for 12.5mg  dosage of the coreg.     Return in about 2 weeks (around 01/05/2019) for HTN .    [name], MD [place]  12/22/2018

## 2018-12-23 ENCOUNTER — Telehealth: Payer: Self-pay | Admitting: Family Medicine

## 2018-12-23 NOTE — Telephone Encounter (Signed)
Copied from [place] [id number]. Topic: Quick Communication - See Telephone Encounter >> Dec 23, 2018  4:21 PM Nils Flack wrote: CRM for notification. See Telephone encounter for: 12/23/18. [place] with [facility] called -  She checked pt's blood pressure - it was 128/62 It was also checked with pt's auto cuff and was 131/59 Cb is [phone]

## 2018-12-23 NOTE — Telephone Encounter (Signed)
See note

## 2018-12-24 ENCOUNTER — Inpatient Hospital Stay (HOSPITAL_BASED_OUTPATIENT_CLINIC_OR_DEPARTMENT_OTHER): Payer: Medicare HMO | Admitting: Hematology and Oncology

## 2018-12-24 ENCOUNTER — Other Ambulatory Visit: Payer: Self-pay

## 2018-12-24 DIAGNOSIS — K5909 Other constipation: Secondary | ICD-10-CM | POA: Diagnosis not present

## 2018-12-24 DIAGNOSIS — Z5111 Encounter for antineoplastic chemotherapy: Secondary | ICD-10-CM | POA: Diagnosis not present

## 2018-12-24 DIAGNOSIS — G8929 Other chronic pain: Secondary | ICD-10-CM

## 2018-12-24 DIAGNOSIS — C539 Malignant neoplasm of cervix uteri, unspecified: Secondary | ICD-10-CM | POA: Diagnosis not present

## 2018-12-24 DIAGNOSIS — M25561 Pain in right knee: Secondary | ICD-10-CM | POA: Diagnosis not present

## 2018-12-24 MED ORDER — HYDROMORPHONE HCL 2 MG PO TABS: 2.0000 mg | ORAL_TABLET | Freq: Four times a day (QID) | ORAL | 0 refills | Status: DC | PRN

## 2018-12-24 MED ORDER — METHADONE HCL 10 MG PO TABS: 10.0000 mg | ORAL_TABLET | Freq: Two times a day (BID) | ORAL | 0 refills | Status: DC

## 2018-12-24 MED FILL — HYDROmorphone HCL 2 MG TABS: 2 | 15 days supply | Qty: 60 | Fill #0

## 2018-12-24 NOTE — Telephone Encounter (Signed)
Spoke to patient's niece, [name] and reviewed notes per Dr. Rogers Blocker.  Wanda verbalizes understanding.

## 2018-12-24 NOTE — Telephone Encounter (Signed)
That is perfect. Please let them know I need a log for the next 2 weeks and to continue with our current plan. I increased her coreg to 12.5mg  BID.  Thanks!

## 2018-12-25 ENCOUNTER — Encounter: Payer: Self-pay | Admitting: Hematology and Oncology

## 2018-12-25 ENCOUNTER — Telehealth: Payer: Self-pay | Admitting: Family Medicine

## 2018-12-25 NOTE — Progress Notes (Signed)
Katie Myers OFFICE PROGRESS NOTE  Patient Care Team: [name], MD as PCP - General (Family Medicine) Polly Cobia, Dayton Bailiff, MD as Referring Physician (Obstetrics and Gynecology) Jacqulyn Liner, RN as Oncology Nurse Navigator (Oncology) Heath Lark, MD as Consulting Physician (Hematology and Oncology)  ASSESSMENT & PLAN:  Cervical cancer [facility] She has appointment pending to see her GYN oncologist with repeat imaging study at [place] I will call her and her daughter on Friday to check on final recommendation If she have positive response to therapy, we will proceed with a few more cycles of treatment  Chronic pain of right knee She has severe, uncontrolled pain last week With change of her pain medicine, her pain is under excellent controlled I recommend the patient to continue on documentation of pain medication intake I have refilled her prescription pain medicine today and we discussed narcotic refill policy  Other constipation She has severe constipation due to erratic oral intake and recent change in her pain medication I recommend her to increase MiraLAX to twice a day and to add Senokot I will call her on Friday to reassess.   No orders of the defined types were placed in this encounter.   INTERVAL HISTORY: Please see below for problem oriented charting. She returns for pain management visit Since last time I saw her, she felt 100% better Her pain is under good control However, she is profoundly constipated.  She has no bowel movement for over 3 days She denies nausea She has appointment pending to see her GYN oncologist soon I collaborated her history with her daughter over the telephone  SUMMARY OF ONCOLOGIC HISTORY:   Cervical cancer ([place])   12/26/2017 Initial Diagnosis    She presented to the ER with symptomatic anemia and had postmenopausal bleeding. Bimanual exam at that time revealed abnormal exam    12/26/2017 Imaging    US  pelvis Limited exam due to difficulty with visualization of the uterus and endometrium.  Calcified submucosal fibroid.  Endometrium measures 10 mm. Endometrium is poorly visualized and not adequately assessed. If bleeding remains unresponsive to hormonal or medical therapy, sonohysterogram should be considered for focal lesion work-up    05/29/2018 Pathology Results    1.  Endometrium, biopsy: - Atypical squamous epithelium with dysplastic features highly suspicious for malignancy.  Note: The biopsy specimen shows squamous epithelium and marked acute inflammation with associated individual squamous cells suggestive of tumor diathesis.  These individual squamous cells show koilocytic changes with cytological features approaching high-grade dysplasia.  Rare keratin pearls are identified.  Areas of more intact squamous epithelium show koilocytic changes, dyskeratosis, and areas of increased mitotic activity (although tissue orientation precludes definitive determination of significance of this finding).  The specimen is negative for endometrial glands.    The clinical findings of a normal cervical exam and upper vaginal nodularity is noted.  Imaging findings of an abnormal uterus are also noted.  The overall findings are extremely concerning for a squamous cell carcinoma, with the differential including vaginal, uterine, or cervical in origin.  A block has been ordered for additional p16 immunohistochemical studies. If clinically indicated, additional tissue sampling may be useful for further diagnosis/classification.  An immunohistochemical stain for p16 is positive (strong blocklike reactivity) on intact fragments of squamous epithelium as well as on detached individual squamous cells. All controls showed appropriate reactivity.   Typically primary squamous cell carcinomas of the uterus are p16-negative, therefore the current positive p16 likely reflects an HPV-driven malignancy of  cervical or  vaginal origin.  High grade dysplasia with individual cells seen representing an element of degeneration in this sample is also technically possible and remains in the differential, however given the clinical and imaging findings I think this is less likely.  Additional clinical and radiological correlation recommended.    07/23/2018 Imaging    CT abdomen and pelvis IMPRESSION: 1. Abnormal uterus with gas in the endometrial cavity, multiple calcified fibroids, and a right rim-enhancing uterine fluid collection measuring 3.3 x 3.5 cm concerning for abscess image 128. This is contiguous with an edematous anterior abdominal wall.  There is soft tissue extension from the uterus to the right which may represent tumor. The uterus is overall poorly defined.  2. Pelvic, retroperitoneal, and portal adenopathy 3. Lung nodules as above. CT chest may be of benefit here. 4. Coronary and aortic ASVD 5. Cholelithiasis 6. Bilateral renal cortical cyst 7. Aortoiliac atherosclerosis without aneurysm 8. Diverticulosis without diverticulitis 10. Fecal retention    09/09/2018 Pathology Results    1. 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  2. 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  3. Endometrium, curettage:  -Fragments of squamous cell carcinoma with areas of invasion, see comment.  4.  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  COMMENT: The biopsies all show fragments of squamous neoplasm ranging from squamous cell carcinoma in situ to areas of invasive carcinoma.  The previous biopsy (case [id number]) showed strong positive p16 staining.  This, coupled with the clinical impression and the vaginal biopsy, is consistent with a vaginal/cervical origin for this tumor.  The endometrial curettage shows no evidence of endometrial tissue.     10/21/2018 PET scan    1. Extensive abnormal activity throughout the uterus, consistent with malignancy. 2. Hypermetabolic malignant-appearing retroperitoneal, external iliac, and inguinal adenopathy.  3. Numerous bilateral pulmonary nodules, most of which are below PET resolution with the largest demonstrating low-level activity. These are indeterminate, but suspicious for metastatic disease. 4. Numerous enlarged and hypermetabolic mediastinal and bilateral hilar lymph nodes, concerning for metastatic disease.  5. Small lymph node adjacent to the left thyroid lobe with low level FDG activity is nonspecific.    10/23/2018 Cancer Staging    Staging form: Cervix Uteri, AJCC 8th Edition - Clinical: FIGO Stage IVB (cT2, cN1, cM1) - Signed by Heath Lark, MD on 10/23/2018    11/05/2018 Procedure    Successful placement of a right IJ approach Power Port with ultrasound and fluoroscopic guidance. The catheter is ready for use.    11/07/2018 -  Chemotherapy    The patient had carboplatin and taxol     REVIEW OF SYSTEMS:   Constitutional: Denies fevers, chills or abnormal weight loss Eyes: Denies blurriness of vision Ears, nose, mouth, throat, and face: Denies mucositis or sore throat Respiratory: Denies cough, dyspnea or wheezes Cardiovascular: Denies palpitation, chest discomfort or lower extremity swelling Skin: Denies abnormal skin rashes Lymphatics: Denies new lymphadenopathy or easy bruising Neurological:Denies numbness, tingling or new weaknesses Behavioral/Psych: Mood is stable, no new changes  All other systems were reviewed with the patient and are negative.  I have reviewed the past medical history, past surgical history, social history and family history with the patient and they are unchanged from previous note.  ALLERGIES:  has No Known Allergies.  MEDICATIONS:  Current Outpatient Medications  Medication Sig Dispense Refill  .  amLODipine (NORVASC) 10 MG tablet Take 1 tablet  (10 mg total) by mouth daily. 90 tablet 1  . atorvastatin (LIPITOR) 40 MG tablet Take 40 mg by mouth daily.    . benazepril (LOTENSIN) 40 MG tablet Take 40 mg by mouth daily.    . carvedilol (COREG) 12.5 MG tablet Take 1 tablet (12.5 mg total) by mouth 2 (two) times daily with a meal. 180 tablet 1  . clopidogrel (PLAVIX) 75 MG tablet     . dexamethasone (DECADRON) 4 MG tablet Take 2 tabs at the night before chemotherapy, every 3 weeks, by mouth with food 24 tablet 0  . DULoxetine (CYMBALTA) 20 MG capsule Take 1 capsule (20 mg total) by mouth daily. Take at night time. 30 capsule 3  . HYDROmorphone (DILAUDID) 2 MG tablet Take 1 tablet (2 mg total) by mouth every 6 (six) hours as needed for severe pain. 60 tablet 0  . Lancets (ONETOUCH DELICA PLUS [id number]) MISC     . lidocaine-prilocaine (EMLA) cream Apply to affected area once 30 g 3  . metFORMIN (GLUCOPHAGE) 500 MG tablet Take 500 mg by mouth daily.    . methadone (DOLOPHINE) 10 MG tablet Take 1 tablet (10 mg total) by mouth every 12 (twelve) hours. 60 tablet 0  . Multiple Vitamins-Minerals (MULTIVITAMIN WITH MINERALS) tablet Take 1 tablet by mouth daily.    . ondansetron (ZOFRAN) 8 MG tablet Take 1 tablet (8 mg total) by mouth every 8 (eight) hours as needed for refractory nausea / vomiting. Start on day 3 after chemo. 30 tablet 1  . prochlorperazine (COMPAZINE) 10 MG tablet Take 1 tablet (10 mg total) by mouth every 6 (six) hours as needed (Nausea or vomiting). 30 tablet 1  . vitamin B-12 (CYANOCOBALAMIN) 1000 MCG tablet Take 1,000 mcg by mouth daily.     No current facility-administered medications for this visit.     PHYSICAL EXAMINATION: ECOG PERFORMANCE STATUS: 2 - Symptomatic, <50% confined to bed  Vitals:   12/24/18 1112  BP: (!) 141/56  Pulse: 97  Resp: 18  Temp: 99.4 F (37.4 C)  SpO2: 95%   Filed Weights   12/24/18 1112  Weight: 139 lb (63 kg)    GENERAL:alert, no distress and comfortable Musculoskeletal:no cyanosis  of digits and no clubbing  NEURO: alert & oriented x 3 with fluent speech, no focal motor/sensory deficits  LABORATORY DATA:  I have reviewed the data as listed    Component Value Date/Time   NA 136 12/18/2018 1118   K 3.8 12/18/2018 1118   CL 98 12/18/2018 1118   CO2 27 12/18/2018 1118   GLUCOSE 194 (H) 12/18/2018 1118   BUN 9 12/18/2018 1118   CREATININE 0.70 12/18/2018 1118   CALCIUM 8.7 (L) 12/18/2018 1118   PROT 8.2 (H) 12/18/2018 1118   ALBUMIN 2.9 (L) 12/18/2018 1118   AST 31 12/18/2018 1118   ALT 17 12/18/2018 1118   ALKPHOS 89 12/18/2018 1118   BILITOT 0.4 12/18/2018 1118   GFRNONAA >60 12/18/2018 1118   GFRAA >60 12/18/2018 1118    No results found for: SPEP, UPEP  Lab Results  Component Value Date   WBC 7.4 12/18/2018   NEUTROABS 5.4 12/18/2018   HGB 9.8 (L) 12/18/2018   HCT 31.1 (L) 12/18/2018   MCV 96.9 12/18/2018   PLT 416 (H) 12/18/2018      Chemistry      Component Value Date/Time   NA 136 12/18/2018 1118   K 3.8 12/18/2018 1118  CL 98 12/18/2018 1118   CO2 27 12/18/2018 1118   BUN 9 12/18/2018 1118   CREATININE 0.70 12/18/2018 1118      Component Value Date/Time   CALCIUM 8.7 (L) 12/18/2018 1118   ALKPHOS 89 12/18/2018 1118   AST 31 12/18/2018 1118   ALT 17 12/18/2018 1118   BILITOT 0.4 12/18/2018 1118     All questions were answered. The patient knows to call the clinic with any problems, questions or concerns. No barriers to learning was detected.  I spent 15 minutes counseling the patient face to face. The total time spent in the appointment was 20 minutes and more than 50% was on counseling and review of test results  Heath Lark, MD 12/25/2018 8:28 AM

## 2018-12-25 NOTE — Telephone Encounter (Signed)
Copied from [place] [id number]. Topic: General - Inquiry >> Dec 25, 2018 11:26 AM Richardo Priest, NT wrote: Reason for CRM: Katie Myers is calling in from [facility] is asking demographics, NPI, attached with office phone and fax, for patient to receive bedside commode.  Fax number is [phone].

## 2018-12-25 NOTE — Telephone Encounter (Signed)
Demographic info for patient and NPI # for Dr. Rogers Blocker faxed to [facility] at [facility] at [phone]

## 2018-12-25 NOTE — Assessment & Plan Note (Signed)
She has appointment pending to see her GYN oncologist with repeat imaging study at [place] I will call her and her daughter on Friday to check on final recommendation If she have positive response to therapy, we will proceed with a few more cycles of treatment

## 2018-12-25 NOTE — Assessment & Plan Note (Signed)
She has severe constipation due to erratic oral intake and recent change in her pain medication I recommend her to increase MiraLAX to twice a day and to add Senokot I will call her on Friday to reassess.

## 2018-12-25 NOTE — Assessment & Plan Note (Signed)
She has severe, uncontrolled pain last week With change of her pain medicine, her pain is under excellent controlled I recommend the patient to continue on documentation of pain medication intake I have refilled her prescription pain medicine today and we discussed narcotic refill policy

## 2018-12-26 ENCOUNTER — Telehealth: Payer: Self-pay | Admitting: Family Medicine

## 2018-12-26 NOTE — Telephone Encounter (Signed)
Spoke to patient's niece, [name].  New antibiotic-Bactrim recently started.  Patient w/sx of dizziness this morning and blood sugar elevated at 250.  Repeated after taking her Metformin, reading dropped to 180.  [name] states that patient is feeling better now.  Also spoke to  [place] (OT) from [facility] who was out this morning but states that she did not have pt perform any exercises due to dizziness, just advised rest for today.    [name] states that they were doing warm compresses to boil and I advised that she should contact Dr. Shelba Flake office to make them aware.  She verbalized understanding.

## 2018-12-26 NOTE — Telephone Encounter (Signed)
Copied from [place] [phone]. Topic: General - Other >> Dec 26, 2018 12:02 PM Rainey Pines A wrote: Reason for CRM: Lysbeth Galas from [facility] stated that she is calling to let Dr. Rogers Blocker know that patients BP reading today was 141/80. Patient also started taking a new antibiotic. Her blood sugar this morning was 250. Patient took Metformin and blood sugar dropped to 180.

## 2018-12-26 NOTE — Telephone Encounter (Signed)
Left detailed voicemail for Katie Myers from [facility] in reference to her home visit w/patient.  Requested a call back.

## 2018-12-28 ENCOUNTER — Emergency Department (HOSPITAL_COMMUNITY)
Admission: EM | Admit: 2018-12-28 | Discharge: 2018-12-28 | Disposition: A | Discharge status: Home / Self Care | Payer: Medicare HMO | Source: Home / Self Care | Attending: Emergency Medicine | Admitting: Emergency Medicine

## 2018-12-28 ENCOUNTER — Other Ambulatory Visit: Payer: Self-pay

## 2018-12-28 DIAGNOSIS — Z79899 Other long term (current) drug therapy: Secondary | ICD-10-CM | POA: Insufficient documentation

## 2018-12-28 DIAGNOSIS — I63412 Cerebral infarction due to embolism of left middle cerebral artery: Secondary | ICD-10-CM | POA: Diagnosis not present

## 2018-12-28 DIAGNOSIS — I10 Essential (primary) hypertension: Secondary | ICD-10-CM | POA: Insufficient documentation

## 2018-12-28 DIAGNOSIS — R0602 Shortness of breath: Secondary | ICD-10-CM | POA: Diagnosis not present

## 2018-12-28 DIAGNOSIS — Z87891 Personal history of nicotine dependence: Secondary | ICD-10-CM | POA: Insufficient documentation

## 2018-12-28 DIAGNOSIS — C539 Malignant neoplasm of cervix uteri, unspecified: Secondary | ICD-10-CM | POA: Insufficient documentation

## 2018-12-28 DIAGNOSIS — E119 Type 2 diabetes mellitus without complications: Secondary | ICD-10-CM | POA: Insufficient documentation

## 2018-12-28 DIAGNOSIS — K5641 Fecal impaction: Secondary | ICD-10-CM

## 2018-12-28 DIAGNOSIS — K5903 Drug induced constipation: Secondary | ICD-10-CM

## 2018-12-28 DIAGNOSIS — Z8673 Personal history of transient ischemic attack (TIA), and cerebral infarction without residual deficits: Secondary | ICD-10-CM | POA: Insufficient documentation

## 2018-12-28 DIAGNOSIS — Z7984 Long term (current) use of oral hypoglycemic drugs: Secondary | ICD-10-CM | POA: Insufficient documentation

## 2018-12-28 DIAGNOSIS — Z7901 Long term (current) use of anticoagulants: Secondary | ICD-10-CM | POA: Insufficient documentation

## 2018-12-28 MED ORDER — SORBITOL 70 % SOLN
960.0000 mL | TOPICAL_OIL | Freq: Once | ORAL | Status: AC
  Administered 2018-12-28: 960 mL via RECTAL
  Filled 2018-12-28: qty 473

## 2018-12-28 NOTE — ED Notes (Signed)
Family members arrived with patient: Katie Myers [phone] (sister) [name] [phone] (daughter)

## 2018-12-28 NOTE — ED Provider Notes (Signed)
[place] DEPT Provider Note   CSN: [id number] Arrival date & time: 12/28/18  0909    History   Chief Complaint Chief Complaint  Patient presents with  . Constipation    HPI Katie Myers is a 73 y.o. female.     HPI  She presents for evaluation of inability to have bowel movement 2 days, despite using Senokot, and MiraLAX.  Last chemotherapy, 3 days ago.  She has some lower abdominal discomfort but denies significant abdominal pain.  She denies nausea, vomiting, fever, chills, cough, shortness of breath, weakness or dizziness.  There are no other known modifying factors.   Past Medical History:  Diagnosis Date  . Cancer (HCC)    cervical cancer  . Diabetes mellitus without complication ([place])   . Hyperlipidemia   . Hypertension   . Knee pain, bilateral   . Neuropathy   . Stroke ([place])   . Underweight     Patient Active Problem List   Diagnosis Date Noted  . Chronic pain of right knee 12/18/2018  . Controlled type 2 diabetes mellitus without complication, without long-term current use of insulin ([place]) 12/10/2018  . Osteoarthritis of both knees 11/20/2018  . 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 Other constipation 11/17/2018  . Other proteinuria 11/03/2018  . Physical debility 10/27/2018  . Goals of care, counseling/discussion 10/27/2018  . Essential hypertension 10/27/2018  . Malignant cachexia ([place]) 10/27/2018  . History of stroke 10/27/2018  . Cervical cancer ([place]) 10/23/2018  . Metastasis to lung ([place]) 10/23/2018  . 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 Iron deficiency anemia due to chronic blood loss 10/23/2018    Past Surgical History:  Procedure Laterality Date  . IR IMAGING GUIDED PORT INSERTION  11/05/2018     OB History   No obstetric history on file.      Home Medications    Prior to Admission medications   Medication Sig Start Date End Date Taking? Authorizing Provider  amLODipine (NORVASC) 10  MG tablet Take 1 tablet (10 mg total) by mouth daily. 12/17/18  Yes [name], MD  atorvastatin (LIPITOR) 40 MG tablet Take 40 mg by mouth daily.   Yes [provider]  benazepril (LOTENSIN) 40 MG tablet Take 40 mg by mouth daily.   Yes [provider]  carvedilol (COREG) 12.5 MG tablet Take 1 tablet (12.5 mg total) by mouth 2 (two) times daily with a meal. 12/22/18  Yes [name], MD  clopidogrel (PLAVIX) 75 MG tablet Take 75 mg by mouth daily.  11/30/18  Yes [provider]  dexamethasone (DECADRON) 4 MG tablet Take 2 tabs at the night before chemotherapy, every 3 weeks, by mouth with food Patient taking differently: Take 8 mg by mouth See admin instructions. Take 2 tabs (8mg  total) at the night before chemotherapy, every 3 weeks, by mouth with food 11/17/18  Yes Gorsuch, Ni, MD  DULoxetine (CYMBALTA) 20 MG capsule Take 1 capsule (20 mg total) by mouth daily. Take at night time. 11/12/18  Yes Gorsuch, Ni, MD  HYDROmorphone (DILAUDID) 2 MG tablet Take 1 tablet (2 mg total) by mouth every 6 (six) hours as needed for severe pain. 12/24/18  Yes Gorsuch, Ni, MD  lidocaine-prilocaine (EMLA) cream Apply to affected area once Patient taking differently: Apply 1 application topically See admin instructions. Apply to affected area the night prior to chemo treatments 11/03/18  Yes Gorsuch, Ni, MD  metFORMIN (GLUCOPHAGE) 500 MG tablet Take 500 mg by mouth  daily.   Yes [provider]  methadone (DOLOPHINE) 10 MG tablet Take 1 tablet (10 mg total) by mouth every 12 (twelve) hours. 12/24/18  Yes Heath Lark, MD  Multiple Vitamins-Minerals (MULTIVITAMIN WITH MINERALS) tablet Take 1 tablet by mouth daily.   Yes [provider]  ondansetron (ZOFRAN) 8 MG tablet Take 1 tablet (8 mg total) by mouth every 8 (eight) hours as needed for refractory nausea / vomiting. Start on day 3 after chemo. 11/03/18  Yes Gorsuch, Ni, MD  polyethylene glycol powder (GLYCOLAX/MIRALAX) 17  GM/SCOOP powder Take 17 g by mouth daily as needed for mild constipation or moderate constipation.   Yes [provider]  prochlorperazine (COMPAZINE) 10 MG tablet Take 1 tablet (10 mg total) by mouth every 6 (six) hours as needed (Nausea or vomiting). 11/03/18  Yes Gorsuch, Ernst Spell, MD  senna (SENOKOT) 8.6 MG tablet Take 2-3 tablets by mouth daily as needed for constipation.    Yes [provider]  sulfamethoxazole-trimethoprim (BACTRIM DS) 800-160 MG tablet Take 1 tablet by mouth 2 (two) times a day. 12/25/18  Yes [provider]  vitamin B-12 (CYANOCOBALAMIN) 1000 MCG tablet Take 1,000 mcg by mouth daily.   Yes [provider]    Family History Family History  Problem Relation Age of Onset  . Cancer Mother        unknown cancer  . Cancer Maternal Aunt        breast ca  . Cancer Maternal Uncle        lung ca  . Cancer Maternal Grandfather        unknown cancer    Social History Social History   Tobacco Use  . Smoking status: Former Smoker    Packs/day: 0.50    Types: Cigarettes  . Smokeless tobacco: Never Used  . Tobacco comment: last smoke week 3/10  Substance Use Topics  . Alcohol use: Never    Frequency: Never  . Drug use: Never     Allergies   Patient has no known allergies.   Review of Systems Review of Systems  All other systems reviewed and are negative.    Physical Exam Updated Vital Signs BP (!) 162/73   Pulse (!) 106   Temp 98.6 F (37 C) (Oral)   Resp 18   Ht 5\' 5"  (1.651 m)   Wt 63 kg   LMP  (LMP Unknown)   SpO2 95%   BMI 23.11 kg/m   Physical Exam Vitals signs and nursing note reviewed.  Constitutional:      General: She is not in acute distress.    Appearance: Normal appearance. She is well-developed. She is not ill-appearing, toxic-appearing or diaphoretic.  HENT:     Head: Normocephalic and atraumatic.     Right Ear: External ear normal.     Left Ear: External ear normal.  Eyes:     Conjunctiva/sclera:  Conjunctivae normal.     Pupils: Pupils are equal, round, and reactive to light.  Neck:     Musculoskeletal: Normal range of motion and neck supple.     Trachea: Phonation normal.  Cardiovascular:     Rate and Rhythm: Normal rate and regular rhythm.     Heart sounds: Normal heart sounds.  Pulmonary:     Effort: Pulmonary effort is normal.     Breath sounds: Normal breath sounds.  Abdominal:     General: There is no distension.     Palpations: Abdomen is soft. There is no mass.  Tenderness: There is no abdominal tenderness.     Hernia: No hernia is present.  Genitourinary:    Comments: Anus with external hemorrhoid which is nontender and nonswollen.  Rectal exam reveals brown stool with partial impaction however it is too high to digitally extract. Musculoskeletal: Normal range of motion.  Skin:    General: Skin is warm and dry.     Coloration: Skin is not jaundiced.  Neurological:     Mental Status: She is alert and oriented to person, place, and time.     Cranial Nerves: No cranial nerve deficit.     Sensory: No sensory deficit.     Motor: No abnormal muscle tone.     Coordination: Coordination normal.  Psychiatric:        Mood and Affect: Mood normal.        Behavior: Behavior normal.        Thought Content: Thought content normal.        Judgment: Judgment normal.      ED Treatments / Results  Labs (all labs ordered are listed, but only abnormal results are displayed) Labs Reviewed - No data to display  EKG None  Radiology No results found.  Procedures Procedures (including critical care time)  Medications Ordered in ED Medications  sorbitol, milk of mag, mineral oil, glycerin (SMOG) enema (960 mLs Rectal Given 12/28/18 1102)     Initial Impression / Assessment and Plan / ED Course  I have reviewed the triage vital signs and the nursing notes.  Pertinent labs & imaging results that were available during my care of the patient were reviewed by me and  considered in my medical decision making (see chart for details).         Patient Vitals for the past 24 hrs:  BP Pulse Resp SpO2  12/28/18 1330 (!) 162/73 (!) 106 18 95 %  12/28/18 1315 - 99 17 92 %  12/28/18 1300 (!) 151/73 95 18 94 %  12/28/18 1245 - (!) 126 16 94 %  12/28/18 1241 (!) 156/71 (!) 130 18 95 %  12/28/18 1230 (!) 156/71 96 - 94 %  12/28/18 1215 - 99 - 92 %  12/28/18 1200 (!) 147/66 98 - 92 %  12/28/18 1143 (!) 146/72 98 18 92 %    1:21 PM Reevaluation with update and discussion. After initial assessment and treatment, an updated evaluation reveals she has had resolution of her symptoms after being treated with Tylenol.  Reports large volume bowel movements, after enema.  Findings discussed and questions answered. Daleen Bo   Medical Decision Making: Evaluation consistent with constipation related to therapeutic narcotic usage.  No indication of bowel obstruction.  Doubt serious bacterial infection or metabolic instability.  CRITICAL CARE-no Performed by: Daleen Bo   Nursing Notes Reviewed/ Care Coordinated Applicable Imaging Reviewed Interpretation of Laboratory Data incorporated into ED treatment  The patient appears reasonably screened and/or stabilized for discharge and I doubt any other medical condition or other [facility] requiring further screening, evaluation, or treatment in the ED at this time prior to discharge.  Plan: Home Medications-add MiraLAX to usual medications; Home Treatments-high-fiber diet; return here if the recommended treatment, does not improve the symptoms; Recommended follow up-PCP, PRN    Final Clinical Impressions(s) / ED Diagnoses   Final diagnoses:  Drug-induced constipation  Fecal impaction [facility])    ED Discharge Orders    None       Daleen Bo, MD 12/29/18 1019

## 2018-12-28 NOTE — ED Notes (Signed)
Patient had large bowel movement.

## 2018-12-28 NOTE — ED Triage Notes (Signed)
Pt states that she had chemo on Wednesday  12/24/18. Pt states that since then, she has not had a bowel movement. Pt states she tried Publishing copy and miralax without relief.  Pt has hx of cervical CA

## 2018-12-28 NOTE — Discharge Instructions (Addendum)
Increase amount of fiber in your diet.  Also use MiraLAX, twice a day until you are having daily soft bowel movements.  See your doctor or return here if needed for problems such as pain or vomiting.

## 2018-12-29 ENCOUNTER — Inpatient Hospital Stay (HOSPITAL_COMMUNITY): Payer: Medicare HMO

## 2018-12-29 ENCOUNTER — Other Ambulatory Visit: Payer: Self-pay

## 2018-12-29 ENCOUNTER — Inpatient Hospital Stay (HOSPITAL_COMMUNITY)
Admission: EM | Admit: 2018-12-29 | Discharge: 2019-01-06 | DRG: 064 | Disposition: A | Discharge status: Home Health | Payer: Medicare HMO | Attending: Internal Medicine | Admitting: Internal Medicine

## 2018-12-29 ENCOUNTER — Emergency Department (HOSPITAL_COMMUNITY): Payer: Medicare HMO

## 2018-12-29 ENCOUNTER — Encounter (HOSPITAL_COMMUNITY): Payer: Self-pay

## 2018-12-29 ENCOUNTER — Telehealth: Payer: Self-pay | Admitting: Oncology

## 2018-12-29 DIAGNOSIS — G8191 Hemiplegia, unspecified affecting right dominant side: Secondary | ICD-10-CM | POA: Diagnosis present

## 2018-12-29 DIAGNOSIS — I10 Essential (primary) hypertension: Secondary | ICD-10-CM | POA: Diagnosis present

## 2018-12-29 DIAGNOSIS — R5081 Fever presenting with conditions classified elsewhere: Secondary | ICD-10-CM | POA: Diagnosis present

## 2018-12-29 DIAGNOSIS — C539 Malignant neoplasm of cervix uteri, unspecified: Secondary | ICD-10-CM | POA: Diagnosis not present

## 2018-12-29 DIAGNOSIS — Z20828 Contact with and (suspected) exposure to other viral communicable diseases: Secondary | ICD-10-CM | POA: Diagnosis present

## 2018-12-29 DIAGNOSIS — R29718 NIHSS score 18: Secondary | ICD-10-CM | POA: Diagnosis present

## 2018-12-29 DIAGNOSIS — I7389 Other specified peripheral vascular diseases: Secondary | ICD-10-CM | POA: Diagnosis present

## 2018-12-29 DIAGNOSIS — Z79899 Other long term (current) drug therapy: Secondary | ICD-10-CM

## 2018-12-29 DIAGNOSIS — E1140 Type 2 diabetes mellitus with diabetic neuropathy, unspecified: Secondary | ICD-10-CM | POA: Diagnosis present

## 2018-12-29 DIAGNOSIS — R471 Dysarthria and anarthria: Secondary | ICD-10-CM | POA: Diagnosis present

## 2018-12-29 DIAGNOSIS — I63412 Cerebral infarction due to embolism of left middle cerebral artery: Secondary | ICD-10-CM | POA: Diagnosis present

## 2018-12-29 DIAGNOSIS — R4701 Aphasia: Secondary | ICD-10-CM | POA: Diagnosis present

## 2018-12-29 DIAGNOSIS — C7800 Secondary malignant neoplasm of unspecified lung: Secondary | ICD-10-CM | POA: Diagnosis present

## 2018-12-29 DIAGNOSIS — Z87891 Personal history of nicotine dependence: Secondary | ICD-10-CM

## 2018-12-29 DIAGNOSIS — E1121 Type 2 diabetes mellitus with diabetic nephropathy: Secondary | ICD-10-CM | POA: Diagnosis present

## 2018-12-29 DIAGNOSIS — G8929 Other chronic pain: Secondary | ICD-10-CM | POA: Diagnosis present

## 2018-12-29 DIAGNOSIS — E785 Hyperlipidemia, unspecified: Secondary | ICD-10-CM | POA: Diagnosis present

## 2018-12-29 DIAGNOSIS — R0602 Shortness of breath: Secondary | ICD-10-CM | POA: Diagnosis present

## 2018-12-29 DIAGNOSIS — D649 Anemia, unspecified: Secondary | ICD-10-CM | POA: Diagnosis not present

## 2018-12-29 DIAGNOSIS — I639 Cerebral infarction, unspecified: Secondary | ICD-10-CM | POA: Diagnosis not present

## 2018-12-29 DIAGNOSIS — E871 Hypo-osmolality and hyponatremia: Secondary | ICD-10-CM | POA: Diagnosis present

## 2018-12-29 DIAGNOSIS — D630 Anemia in neoplastic disease: Secondary | ICD-10-CM | POA: Diagnosis present

## 2018-12-29 DIAGNOSIS — R0600 Dyspnea, unspecified: Secondary | ICD-10-CM

## 2018-12-29 DIAGNOSIS — D703 Neutropenia due to infection: Secondary | ICD-10-CM | POA: Diagnosis present

## 2018-12-29 DIAGNOSIS — D709 Neutropenia, unspecified: Secondary | ICD-10-CM

## 2018-12-29 DIAGNOSIS — Z7902 Long term (current) use of antithrombotics/antiplatelets: Secondary | ICD-10-CM

## 2018-12-29 DIAGNOSIS — J90 Pleural effusion, not elsewhere classified: Secondary | ICD-10-CM | POA: Diagnosis not present

## 2018-12-29 DIAGNOSIS — J189 Pneumonia, unspecified organism: Secondary | ICD-10-CM | POA: Diagnosis present

## 2018-12-29 DIAGNOSIS — K5903 Drug induced constipation: Secondary | ICD-10-CM | POA: Diagnosis present

## 2018-12-29 DIAGNOSIS — R0902 Hypoxemia: Secondary | ICD-10-CM | POA: Diagnosis present

## 2018-12-29 DIAGNOSIS — Y95 Nosocomial condition: Secondary | ICD-10-CM | POA: Diagnosis present

## 2018-12-29 DIAGNOSIS — D6481 Anemia due to antineoplastic chemotherapy: Secondary | ICD-10-CM | POA: Diagnosis present

## 2018-12-29 DIAGNOSIS — T451X5A Adverse effect of antineoplastic and immunosuppressive drugs, initial encounter: Secondary | ICD-10-CM

## 2018-12-29 DIAGNOSIS — Z79891 Long term (current) use of opiate analgesic: Secondary | ICD-10-CM

## 2018-12-29 DIAGNOSIS — R29810 Facial weakness: Secondary | ICD-10-CM | POA: Diagnosis present

## 2018-12-29 DIAGNOSIS — R1311 Dysphagia, oral phase: Secondary | ICD-10-CM | POA: Diagnosis present

## 2018-12-29 DIAGNOSIS — D701 Agranulocytosis secondary to cancer chemotherapy: Secondary | ICD-10-CM

## 2018-12-29 DIAGNOSIS — Z7984 Long term (current) use of oral hypoglycemic drugs: Secondary | ICD-10-CM

## 2018-12-29 DIAGNOSIS — C779 Secondary and unspecified malignant neoplasm of lymph node, unspecified: Secondary | ICD-10-CM | POA: Diagnosis present

## 2018-12-29 DIAGNOSIS — E1165 Type 2 diabetes mellitus with hyperglycemia: Secondary | ICD-10-CM | POA: Diagnosis present

## 2018-12-29 DIAGNOSIS — Z803 Family history of malignant neoplasm of breast: Secondary | ICD-10-CM

## 2018-12-29 DIAGNOSIS — E876 Hypokalemia: Secondary | ICD-10-CM | POA: Diagnosis not present

## 2018-12-29 DIAGNOSIS — D500 Iron deficiency anemia secondary to blood loss (chronic): Secondary | ICD-10-CM

## 2018-12-29 DIAGNOSIS — K5900 Constipation, unspecified: Secondary | ICD-10-CM | POA: Diagnosis not present

## 2018-12-29 DIAGNOSIS — R4702 Dysphasia: Secondary | ICD-10-CM | POA: Diagnosis present

## 2018-12-29 DIAGNOSIS — Z801 Family history of malignant neoplasm of trachea, bronchus and lung: Secondary | ICD-10-CM

## 2018-12-29 DIAGNOSIS — L8990 Pressure ulcer of unspecified site, unspecified stage: Secondary | ICD-10-CM

## 2018-12-29 DIAGNOSIS — Z8541 Personal history of malignant neoplasm of cervix uteri: Secondary | ICD-10-CM

## 2018-12-29 LAB — CBC WITH DIFFERENTIAL/PLATELET
Abs Immature Granulocytes: 0.01 10*3/uL (ref 0.00–0.07)
Basophils Absolute: 0 10*3/uL (ref 0.0–0.1)
Basophils Relative: 0 %
Eosinophils Absolute: 0 10*3/uL (ref 0.0–0.5)
Eosinophils Relative: 0 %
HCT: 27.7 % — ABNORMAL LOW (ref 36.0–46.0)
Hemoglobin: 8.9 g/dL — ABNORMAL LOW (ref 12.0–15.0)
Immature Granulocytes: 2 %
Lymphocytes Relative: 32 %
Lymphs Abs: 0.2 10*3/uL — ABNORMAL LOW (ref 0.7–4.0)
MCH: 30.8 pg (ref 26.0–34.0)
MCHC: 32.1 g/dL (ref 30.0–36.0)
MCV: 95.8 fL (ref 80.0–100.0)
Monocytes Absolute: 0.1 10*3/uL (ref 0.1–1.0)
Monocytes Relative: 10 %
Neutro Abs: 0.4 10*3/uL — ABNORMAL LOW (ref 1.7–7.7)
Neutrophils Relative %: 56 %
Platelets: 281 10*3/uL (ref 150–400)
RBC: 2.89 MIL/uL — ABNORMAL LOW (ref 3.87–5.11)
RDW: 17.9 % — ABNORMAL HIGH (ref 11.5–15.5)
WBC: 0.6 10*3/uL — CL (ref 4.0–10.5)
nRBC: 0 % (ref 0.0–0.2)

## 2018-12-29 LAB — GLUCOSE, CAPILLARY: Glucose-Capillary: 134 mg/dL — ABNORMAL HIGH (ref 70–99)

## 2018-12-29 LAB — MRSA PCR SCREENING: MRSA by PCR: NEGATIVE

## 2018-12-29 LAB — COMPREHENSIVE METABOLIC PANEL
ALT: 20 U/L (ref 0–44)
AST: 30 U/L (ref 15–41)
Albumin: 3 g/dL — ABNORMAL LOW (ref 3.5–5.0)
Alkaline Phosphatase: 85 U/L (ref 38–126)
Anion gap: 11 (ref 5–15)
BUN: 6 mg/dL — ABNORMAL LOW (ref 8–23)
CO2: 25 mmol/L (ref 22–32)
Calcium: 8.7 mg/dL — ABNORMAL LOW (ref 8.9–10.3)
Chloride: 93 mmol/L — ABNORMAL LOW (ref 98–111)
Creatinine, Ser: 0.6 mg/dL (ref 0.44–1.00)
GFR calc Af Amer: >60 mL/min (ref >60)
GFR calc non Af Amer: >60 mL/min (ref >60)
Glucose, Bld: 188 mg/dL — ABNORMAL HIGH (ref 70–99)
Potassium: 3.8 mmol/L (ref 3.5–5.1)
Sodium: 129 mmol/L — ABNORMAL LOW (ref 135–145)
Total Bilirubin: 1.2 mg/dL (ref 0.3–1.2)
Total Protein: 8.2 g/dL — ABNORMAL HIGH (ref 6.5–8.1)

## 2018-12-29 LAB — SARS CORONAVIRUS 2 BY RT PCR (HOSPITAL ORDER, PERFORMED IN ~~LOC~~ HOSPITAL LAB): SARS Coronavirus 2: NEGATIVE

## 2018-12-29 LAB — LACTIC ACID, PLASMA: Lactic Acid, Venous: 1.7 mmol/L (ref 0.5–1.9)

## 2018-12-29 MED ORDER — ENOXAPARIN SODIUM 40 MG/0.4ML ~~LOC~~ SOLN
40.0000 mg | SUBCUTANEOUS | Status: DC
  Administered 2018-12-29 – 2019-01-05 (×8): 40 mg via SUBCUTANEOUS
  Filled 2018-12-29 (×8): qty 0.4

## 2018-12-29 MED ORDER — GADOBUTROL 1 MMOL/ML IV SOLN
6.0000 mL | Freq: Once | INTRAVENOUS | Status: AC | PRN
  Administered 2018-12-29: 19:00:00 6 mL via INTRAVENOUS

## 2018-12-29 MED ORDER — SODIUM CHLORIDE 0.9 % IV SOLN
2.0000 g | Freq: Once | INTRAVENOUS | Status: AC
  Administered 2018-12-29: 15:00:00 2 g via INTRAVENOUS
  Filled 2018-12-29: qty 2

## 2018-12-29 MED ORDER — INSULIN ASPART 100 UNIT/ML ~~LOC~~ SOLN
0.0000 [IU] | Freq: Every day | SUBCUTANEOUS | Status: DC
  Administered 2019-01-03: 22:00:00 3 [IU] via SUBCUTANEOUS

## 2018-12-29 MED ORDER — ACETAMINOPHEN 650 MG RE SUPP
650.0000 mg | Freq: Once | RECTAL | Status: AC
  Administered 2018-12-29: 15:00:00 650 mg via RECTAL
  Filled 2018-12-29: qty 1

## 2018-12-29 MED ORDER — SODIUM CHLORIDE 0.9 % IV SOLN
2.0000 g | Freq: Two times a day (BID) | INTRAVENOUS | Status: DC
  Administered 2018-12-29 – 2018-12-30 (×3): 2 g via INTRAVENOUS
  Filled 2018-12-29 (×4): qty 2

## 2018-12-29 MED ORDER — VANCOMYCIN HCL 10 G IV SOLR
1250.0000 mg | INTRAVENOUS | Status: DC
  Administered 2018-12-30: 05:00:00 1250 mg via INTRAVENOUS
  Filled 2018-12-29: qty 1250

## 2018-12-29 MED ORDER — INSULIN ASPART 100 UNIT/ML ~~LOC~~ SOLN
0.0000 [IU] | Freq: Three times a day (TID) | SUBCUTANEOUS | Status: DC
  Administered 2019-01-01: 2 [IU] via SUBCUTANEOUS
  Administered 2019-01-01: 12:00:00 1 [IU] via SUBCUTANEOUS
  Administered 2019-01-02 – 2019-01-03 (×3): 2 [IU] via SUBCUTANEOUS
  Administered 2019-01-03: 08:00:00 1 [IU] via SUBCUTANEOUS
  Administered 2019-01-03 – 2019-01-04 (×3): 2 [IU] via SUBCUTANEOUS
  Administered 2019-01-05: 10:00:00 1 [IU] via SUBCUTANEOUS
  Administered 2019-01-05: 13:00:00 2 [IU] via SUBCUTANEOUS
  Administered 2019-01-05: 18:00:00 1 [IU] via SUBCUTANEOUS
  Administered 2019-01-06: 13:00:00 3 [IU] via SUBCUTANEOUS
  Administered 2019-01-06: 08:00:00 1 [IU] via SUBCUTANEOUS
  Administered 2019-01-06: 18:00:00 2 [IU] via SUBCUTANEOUS

## 2018-12-29 MED ORDER — ACETAMINOPHEN 325 MG PO TABS
650.0000 mg | ORAL_TABLET | Freq: Once | ORAL | Status: DC
  Filled 2018-12-29: qty 2

## 2018-12-29 MED ORDER — CLOPIDOGREL BISULFATE 75 MG PO TABS
75.0000 mg | ORAL_TABLET | Freq: Every day | ORAL | Status: DC
  Administered 2019-01-01 – 2019-01-06 (×6): 75 mg via ORAL
  Filled 2018-12-29 (×7): qty 1

## 2018-12-29 MED ORDER — VANCOMYCIN HCL IN DEXTROSE 1-5 GM/200ML-% IV SOLN
1000.0000 mg | Freq: Once | INTRAVENOUS | Status: AC
  Administered 2018-12-29: 1000 mg via INTRAVENOUS
  Filled 2018-12-29: qty 200

## 2018-12-29 MED ORDER — SODIUM CHLORIDE 0.9 % IV SOLN
INTRAVENOUS | Status: DC
  Administered 2018-12-29 – 2019-01-04 (×5): via INTRAVENOUS

## 2018-12-29 NOTE — Telephone Encounter (Signed)
Attempted to call patient's niece, [name].  There was no answer and voicemail was full.  Will try again.

## 2018-12-29 NOTE — ED Notes (Signed)
Per Regenia Skeeter, ok to not draw second lactic.

## 2018-12-29 NOTE — ED Notes (Signed)
hospitalist at bedside

## 2018-12-29 NOTE — Progress Notes (Addendum)
Pharmacy Antibiotic Note  Katie Myers is a 73 y.o. female admitted on 12/29/2018 with pneumonia.  Pharmacy has been consulted for vancomycin and cefepime dosing.  Plan:  Vancomycin 1000 mg IV now, then 1250 mg IV q24 hr (est AUC 538 based on SCr rounded to 0.8)  Measure vancomycin AUC at steady state as indicated  Cefepime 2 g IV q12 hr  SCr q48 hr while on vanc  Height: 5\' 5"  (165.1 cm) Weight: 138 lb (62.6 kg) IBW/kg (Calculated) : 57  Temp (24hrs), Avg:101.8 F (38.8 C), Min:101.8 F (38.8 C), Max:101.8 F (38.8 C)  Recent Labs  Lab 12/29/18 1336  WBC 0.6*  CREATININE 0.60  LATICACIDVEN 1.7    Estimated Creatinine Clearance: 56.4 mL/min (by C-G formula based on SCr of 0.6 mg/dL).    No Known Allergies  Antimicrobials this admission: Vanc 5/11 >>  Cefepime 5/11 >>   Dose adjustments this admission: n/a  Microbiology results: 5/11 BCx: sent 5/11 MRSA PCR: ordered  Thank you for allowing pharmacy to be a part of this patient's care.  Reuel Boom, PharmD, BCPS [phone] 12/29/2018, 5:25 PM

## 2018-12-29 NOTE — ED Notes (Signed)
Attempted to call Main and ED CT. No answer.

## 2018-12-29 NOTE — ED Notes (Signed)
X RAY at bedside 

## 2018-12-29 NOTE — Telephone Encounter (Addendum)
[name] left a message saying that Katie Myers is short of breath and the ambulance is there to take her to [facility] ER.  She wanted to let Dr. Alvy Bimler know.    Called her back and let her know that Dr. Alvy Bimler was notified that Katie Myers is in the ER.  [name] said that she is going to be admitted to [facility].

## 2018-12-29 NOTE — ED Notes (Signed)
Pt daughterShirlean Mylar, requesting to be involved in POC [phone]

## 2018-12-29 NOTE — ED Notes (Addendum)
Patient unable to provide name or meaningful questions to this RN. Regenia Skeeter, MD made aware.   Regenia Skeeter, MD at bedside to access patients bottom.  Verbal for rectal tylenol given by Regenia Skeeter, MD due to patients altered mental status.

## 2018-12-29 NOTE — ED Notes (Signed)
Spoke to patients daughter and updated on patients status.

## 2018-12-29 NOTE — Progress Notes (Signed)
A consult was received from an ED physician for vancomycin and cefepime per pharmacy dosing.  The patient's profile has been reviewed for ht/wt/allergies/indication/available labs.    A one time order has been placed for vancomycin 1000 mg and cefepime 2gm IV.  Further antibiotics/pharmacy consults should be ordered by admitting physician if indicated.                       Thank you, Lynelle Doctor 12/29/2018  2:15 PM

## 2018-12-29 NOTE — ED Notes (Signed)
This nurse back in pt room to inform pt that family is calling and thinking about her. Pt appears to be more awake, but speech remains incompressible and garbled. This nurse notified EDP Regenia Skeeter of pt current status. This nurse also informed EDP Regenia Skeeter that pt family is concerned d/t mental status change /pt not unable to communicate via phone.

## 2018-12-29 NOTE — ED Notes (Signed)
Date and time results received: 12/29/18 2:29 PM    Test: WBC Critical Value: 0.6  Name of Provider Notified: Regenia Skeeter MD  Orders Received? Or Actions Taken?: acknowledges order

## 2018-12-29 NOTE — ED Notes (Signed)
Transport called to transfer pt.

## 2018-12-29 NOTE — ED Notes (Signed)
ED TO INPATIENT HANDOFF REPORT  Name/Age/Gender Katie Myers 73 y.o. female  Code Status    Code Status Orders  (From admission, onward)         Start     Ordered   12/29/18 1707  Full code  Continuous     12/29/18 1706        Code Status History    This patient has a current code status but no historical code status.    Advance Directive Documentation     Most Recent Value  Type of Advance Directive  Healthcare Power of Attorney, Living will  Pre-existing out of facility DNR order (yellow form or pink MOST form)  -  "MOST" Form in Place?  -      Home/SNF/Other Home   Chief Complaint shob fever  Level of Care/Admitting Diagnosis ED Disposition    ED Disposition Condition [facility] Area: [place] [[id number]]  Level of Care: Med-Surg [16]  Covid Evaluation: N/A  Diagnosis: Pneumonia [[id number]]  Admitting Physician: Georgette Shell [[id number]]  Attending Physician: Georgette Shell [[id number]]  Estimated length of stay: 3 - 4 days  Certification:: I certify this patient will need inpatient services for at least 2 midnights  PT Class (Do Not Modify): Inpatient [101]  PT Acc Code (Do Not Modify): Private [1]       Medical History Past Medical History:  Diagnosis Date  . Cancer (HCC)    cervical cancer  . Diabetes mellitus without complication ([place])   . Hyperlipidemia   . Hypertension   . Knee pain, bilateral   . Neuropathy   . Stroke ([place])   . Underweight     Allergies No Known Allergies  IV Location/Drains/Wounds Patient Lines/Drains/Airways Status   Active Line/Drains/Airways    Name:   Placement date:   Placement time:   Site:   Days:   Implanted Port 11/05/18 Right Chest   11/05/18    0915    Chest   54   Peripheral IV 12/29/18 Right Antecubital   12/29/18    1358    Antecubital   less than 1   External Urinary Catheter   12/29/18    1353    -   less than 1   Incision (Closed) 11/05/18 Neck Right    11/05/18    0930     54   Incision (Closed) 11/05/18 Chest Right   11/05/18    0930     54          Labs/Imaging Results for orders placed or performed during the hospital encounter of 12/29/18 (from the past 48 hour(s))  Lactic acid, plasma     Status: None   Collection Time: 12/29/18  1:36 PM  Result Value Ref Range   Lactic Acid, Venous 1.7 0.5 - 1.9 mmol/L    Comment: Performed at [facility], [place] [address] [place], [place] [address]  Comprehensive metabolic panel     Status: Abnormal   Collection Time: 12/29/18  1:36 PM  Result Value Ref Range   Sodium 129 (L) 135 - 145 mmol/L   Potassium 3.8 3.5 - 5.1 mmol/L   Chloride 93 (L) 98 - 111 mmol/L   CO2 25 22 - 32 mmol/L   Glucose, Bld 188 (H) 70 - 99 mg/dL   BUN 6 (L) 8 - 23 mg/dL   Creatinine, Ser 0.60 0.44 - 1.00 mg/dL   Calcium 8.7 (L) 8.9 -  10.3 mg/dL   Total Protein 8.2 (H) 6.5 - 8.1 g/dL   Albumin 3.0 (L) 3.5 - 5.0 g/dL   AST 30 15 - 41 U/L   ALT 20 0 - 44 U/L   Alkaline Phosphatase 85 38 - 126 U/L   Total Bilirubin 1.2 0.3 - 1.2 mg/dL   GFR calc non Af Amer >60 >60 mL/min   GFR calc Af Amer >60 >60 mL/min   Anion gap 11 5 - 15    Comment: Performed at [facility], [place] [address], [place], [place] [address]  CBC WITH DIFFERENTIAL     Status: Abnormal (Preliminary result)   Collection Time: 12/29/18  1:36 PM  Result Value Ref Range   WBC 0.6 (LL) 4.0 - 10.5 K/uL    Comment: REPEATED TO VERIFY WHITE COUNT CONFIRMED ON SMEAR THIS CRITICAL RESULT HAS VERIFIED AND BEEN CALLED TO M.ROSSER BY NATHAN THOMPSON ON 05 11 2020 AT 1427, AND HAS BEEN READ BACK. CRITICAL RESULT VERIFIED    RBC 2.89 (L) 3.87 - 5.11 MIL/uL   Hemoglobin 8.9 (L) 12.0 - 15.0 g/dL   HCT 27.7 (L) 36.0 - 46.0 %   MCV 95.8 80.0 - 100.0 fL   MCH 30.8 26.0 - 34.0 pg   MCHC 32.1 30.0 - 36.0 g/dL   RDW 17.9 (H) 11.5 - 15.5 %   Platelets 281 150 - 400 K/uL   nRBC 0.0 0.0 - 0.2 %    Comment: Performed at [facility], [place] [address] [place], [place] [address]   Neutrophils Relative % PENDING %   Neutro Abs PENDING 1.7 - 7.7 K/uL   Band Neutrophils PENDING %   Lymphocytes Relative PENDING %   Lymphs Abs PENDING 0.7 - 4.0 K/uL   Monocytes Relative PENDING %   Monocytes Absolute PENDING 0.1 - 1.0 K/uL   Eosinophils Relative PENDING %   Eosinophils Absolute PENDING 0.0 - 0.5 K/uL   Basophils Relative PENDING %   Basophils Absolute PENDING 0.0 - 0.1 K/uL   WBC Morphology PENDING    RBC Morphology PENDING    Smear Review PENDING    Other PENDING %   nRBC PENDING 0 /100 WBC   Metamyelocytes Relative PENDING %   Myelocytes PENDING %   Promyelocytes Relative PENDING %   Blasts PENDING %  Blood Culture (routine x 2)     Status: None (Preliminary result)   Collection Time: 12/29/18  1:36 PM  Result Value Ref Range   Specimen Description      BLOOD LEFT ANTECUBITAL Performed at [facility], [place] [address] [place], [place] [address]    Special Requests      BOTTLES DRAWN AEROBIC AND ANAEROBIC Blood Culture adequate volume Performed at [place] [address] [place], [place] [address]    Culture PENDING    Report Status PENDING   Blood Culture (routine x 2)     Status: None (Preliminary result)   Collection Time: 12/29/18  1:52 PM  Result Value Ref Range   Specimen Description      BLOOD RIGHT ANTECUBITAL Performed at [facility], [place] [address] [place], [place] [address]    Special Requests      BOTTLES DRAWN AEROBIC AND ANAEROBIC Blood Culture results may not be optimal due to an excessive volume of blood received in culture bottles Performed at [place] [address], [place],  [address]    Culture PENDING  Report Status PENDING   SARS Coronavirus 2 (CEPHEID- Performed in [facility] hospital lab), Hosp Order     Status: None   Collection Time: 12/29/18  2:40 PM  Result Value Ref Range    SARS Coronavirus 2 NEGATIVE NEGATIVE    Comment: (NOTE) If result is NEGATIVE SARS-CoV-2 target nucleic acids are NOT DETECTED. The SARS-CoV-2 RNA is generally detectable in upper and lower  respiratory specimens during the acute phase of infection. The lowest  concentration of SARS-CoV-2 viral copies this assay can detect is 250  copies / mL. A negative result does not preclude SARS-CoV-2 infection  and should not be used as the sole basis for treatment or other  patient management decisions.  A negative result may occur with  improper specimen collection / handling, submission of specimen other  than nasopharyngeal swab, presence of viral mutation(s) within the  areas targeted by this assay, and inadequate number of viral copies  (<250 copies / mL). A negative result must be combined with clinical  observations, patient history, and epidemiological information. If result is POSITIVE SARS-CoV-2 target nucleic acids are DETECTED. The SARS-CoV-2 RNA is generally detectable in upper and lower  respiratory specimens dur ing the acute phase of infection.  Positive  results are indicative of active infection with SARS-CoV-2.  Clinical  correlation with patient history and other diagnostic information is  necessary to determine patient infection status.  Positive results do  not rule out bacterial infection or co-infection with other viruses. If result is PRESUMPTIVE POSTIVE SARS-CoV-2 nucleic acids MAY BE PRESENT.   A presumptive positive result was obtained on the submitted specimen  and confirmed on repeat testing.  While 2019 novel coronavirus  (SARS-CoV-2) nucleic acids may be present in the submitted sample  additional confirmatory testing may be necessary for epidemiological  and / or clinical management purposes  to differentiate between  SARS-CoV-2 and other Sarbecovirus currently known to infect humans.  If clinically indicated additional testing with an alternate test   methodology [phone]) is advised. The SARS-CoV-2 RNA is generally  detectable in upper and lower respiratory sp ecimens during the acute  phase of infection. The expected result is Negative. Fact Sheet for Patients:  StrictlyIdeas.no Fact Sheet for Healthcare Providers: BankingDealers.co.za This test is not yet approved or cleared by the [place] FDA and has been authorized for detection and/or diagnosis of SARS-CoV-2 by FDA under an Emergency Use Authorization (EUA).  This EUA will remain in effect (meaning this test can be used) for the duration of the COVID-19 declaration under Section 564(b)(1) of the Act, 21 U.S.C. section 360bbb-3(b)(1), unless the authorization is terminated or revoked sooner. Performed at [facility], [place] [address] [place], [place] [address]    Ct Head Wo Contrast  Result Date: 12/29/2018 CLINICAL DATA:  Altered level of consciousness. History of cervical cancer. EXAM: CT HEAD WITHOUT CONTRAST TECHNIQUE: Contiguous axial images were obtained from the base of the skull through the vertex without intravenous contrast. COMPARISON:  None. FINDINGS: Brain: Mild atrophy. Patchy white matter hypodensity bilaterally appears chronic. No acute infarct, hemorrhage, or mass lesion. Vascular: Atherosclerotic calcification in the carotid and vertebral arteries. Skull: Negative Sinuses/Orbits: Negative Other: None IMPRESSION: No acute abnormality. Mild atrophy and mild chronic microvascular ischemic type changes in the white matter. Electronically Signed   By: Franchot Gallo M.D.   On: 12/29/2018 16:37   Dg Chest Portable 1 View  Result Date: 12/29/2018 CLINICAL DATA:  Hypoxia, fever. EXAM: PORTABLE CHEST 1  VIEW COMPARISON:  None. FINDINGS: The heart size and mediastinal contours are within normal limits. Atherosclerosis of thoracic aorta is noted. Right internal jugular Port-A-Cath is unchanged in position.  No pneumothorax is noted. Mild bilateral perihilar and basilar opacities are noted concerning for edema or possibly inflammation. Small pleural effusions may be present. The visualized skeletal structures are unremarkable. IMPRESSION: Mild bilateral perihilar and basilar opacities are noted concerning for edema or possibly inflammation. Small pleural effusions. Aortic Atherosclerosis (ICD10-I70.0). Electronically Signed   By: Marijo Conception M.D.   On: 12/29/2018 14:23    Pending Labs Unresulted Labs (From admission, onward)    Start     Ordered   12/30/18 0500  Comprehensive metabolic panel  Tomorrow morning,   R     12/29/18 1706   12/30/18 0500  CBC  Tomorrow morning,   R     12/29/18 1706   12/30/18 0500  Creatinine, serum  Every 48 hours,   R     12/29/18 1722   12/30/18 0500  Hemoglobin A1c  Tomorrow morning,   R    Comments:  To assess prior glycemic control    12/29/18 1723   12/29/18 1723  MRSA PCR Screening  Once,   R     12/29/18 1723   12/29/18 1723  Urinalysis, Routine w reflex microscopic  Once,   R     12/29/18 1723   12/29/18 1723  Culture, Urine  Once,   R     12/29/18 1723   12/29/18 1336  Urinalysis, Routine w reflex microscopic  ONCE - STAT,   STAT     12/29/18 1336          Vitals/Pain Today's Vitals   12/29/18 1530 12/29/18 1600 12/29/18 1641 12/29/18 1700  BP: (!) 120/56 133/65 (!) 105/56 137/63  Pulse: 96 91 85 90  Resp: 17 15 16 15   Temp:      TempSrc:      SpO2: 94% 100% 100% 99%  Weight:      Height:      PainSc:        Isolation Precautions Droplet and Contact precautions  Medications Medications  vancomycin (VANCOCIN) IVPB 1000 mg/200 mL premix (1,000 mg Intravenous New Bag/Given 12/29/18 1640)  enoxaparin (LOVENOX) injection 30 mg (has no administration in time range)  0.9 %  sodium chloride infusion (has no administration in time range)  clopidogrel (PLAVIX) tablet 75 mg (has no administration in time range)  vancomycin (VANCOCIN)  1,250 mg in sodium chloride 0.9 % 250 mL IVPB (has no administration in time range)  ceFEPIme (MAXIPIME) 2 g in sodium chloride 0.9 % 100 mL IVPB (has no administration in time range)  insulin aspart (novoLOG) injection 0-9 Units (has no administration in time range)  insulin aspart (novoLOG) injection 0-5 Units (has no administration in time range)  ceFEPIme (MAXIPIME) 2 g in sodium chloride 0.9 % 100 mL IVPB (0 g Intravenous Stopped 12/29/18 1539)  acetaminophen (TYLENOL) suppository 650 mg (650 mg Rectal Given 12/29/18 1439)    Mobility walks with device

## 2018-12-29 NOTE — Telephone Encounter (Signed)
Called [name] to see how Inaara is doing.  She said she is feeling better today and has had one bowel movement since going to the ER yesterday.  Discussed that she needs to continue to taking laxatives on a regular basis.  [name] verbalized agreement and said Safa has taken Miralax today.

## 2018-12-29 NOTE — ED Notes (Signed)
Transport is taking patient to MRI. Patient will then be transported to [place].

## 2018-12-29 NOTE — ED Provider Notes (Signed)
[facility] DEPT Provider Note   CSN: [id number] Arrival date & time: 12/29/18  1307  LEVEL 5 CAVEAT - ALTERED MENTAL STATUS   History   Chief Complaint Chief Complaint  Patient presents with  . Shortness of Breath    HPI Katie Myers is a 73 y.o. female.     HPI  73 year old female currently being treated for metastatic cancer presents with shortness of breath.  I discussed with the power of attorney, [name], who relates the patient was short of breath starting this morning.  She was in the ED yesterday for constipation.  Shortness of breath seems new and EMS reports O2 sats in the 70s.  Nurse at the bedside reports the patient sats were in the 80s on room air and came up with O2 via nasal cannula.  Otherwise, found to have a fever.  No other symptoms per family.  The patient normally is very talkative but seems confused today. Niece states she has been taking antibiotics for a boil in her perineum. Improved since taking the antibiotics.  Past Medical History:  Diagnosis Date  . Cancer (HCC)    cervical cancer  . Diabetes mellitus without complication ([place])   . Hyperlipidemia   . Hypertension   . Knee pain, bilateral   . Neuropathy   . Stroke ([place])   . Underweight     Patient Active Problem List   Diagnosis Date Noted  . Chronic pain of right knee 12/18/2018  . Controlled type 2 diabetes mellitus without complication, without long-term current use of insulin ([place]) 12/10/2018  . Osteoarthritis of both knees 11/20/2018  . 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 Other constipation 11/17/2018  . Other proteinuria 11/03/2018  . Physical debility 10/27/2018  . Goals of care, counseling/discussion 10/27/2018  . Essential hypertension 10/27/2018  . Malignant cachexia ([place]) 10/27/2018  . History of stroke 10/27/2018  . Cervical cancer ([place]) 10/23/2018  . Metastasis to lung ([place]) 10/23/2018  . 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 Iron deficiency anemia due to chronic blood loss 10/23/2018    Past Surgical History:  Procedure Laterality Date  . IR IMAGING GUIDED PORT INSERTION  11/05/2018     OB History   No obstetric history on file.      Home Medications    Prior to Admission medications   Medication Sig Start Date End Date Taking? Authorizing Provider  amLODipine (NORVASC) 10 MG tablet Take 1 tablet (10 mg total) by mouth daily. 12/17/18  Yes [name], MD  atorvastatin (LIPITOR) 40 MG tablet Take 40 mg by mouth daily.   Yes [provider]  benazepril (LOTENSIN) 40 MG tablet Take 40 mg by mouth daily.   Yes [provider]  carvedilol (COREG) 12.5 MG tablet Take 1 tablet (12.5 mg total) by mouth 2 (two) times daily with a meal. 12/22/18  Yes [name], MD  clopidogrel (PLAVIX) 75 MG tablet Take 75 mg by mouth daily.  11/30/18  Yes [provider]  dexamethasone (DECADRON) 4 MG tablet Take 2 tabs at the night before chemotherapy, every 3 weeks, by mouth with food Patient taking differently: Take 8 mg by mouth See admin instructions. Take 2 tabs (8mg  total) at the night before chemotherapy, every 3 weeks, by mouth with food 11/17/18  Yes Gorsuch, Ni, MD  DULoxetine (CYMBALTA) 20 MG capsule Take 1 capsule (20 mg total) by mouth daily. Take at night time. 11/12/18  Yes Heath Lark, MD  HYDROmorphone (DILAUDID) 2 MG tablet Take 1 tablet (2 mg total) by mouth every 6 (six) hours as needed for severe pain. 12/24/18  Yes Gorsuch, Ni, MD  lidocaine-prilocaine (EMLA) cream Apply to affected area once Patient taking differently: Apply 1 application topically See admin instructions. Apply to affected area the night prior to chemo treatments 11/03/18  Yes Gorsuch, Ni, MD  metFORMIN (GLUCOPHAGE) 500 MG tablet Take 500 mg by mouth daily.   Yes [provider]  methadone (DOLOPHINE) 10 MG tablet Take 1 tablet (10 mg total) by mouth every 12 (twelve) hours. 12/24/18  Yes Heath Lark, MD   Multiple Vitamins-Minerals (MULTIVITAMIN WITH MINERALS) tablet Take 1 tablet by mouth daily.   Yes [provider]  ondansetron (ZOFRAN) 8 MG tablet Take 1 tablet (8 mg total) by mouth every 8 (eight) hours as needed for refractory nausea / vomiting. Start on day 3 after chemo. 11/03/18  Yes Gorsuch, Ni, MD  polyethylene glycol powder (GLYCOLAX/MIRALAX) 17 GM/SCOOP powder Take 17 g by mouth daily as needed for mild constipation or moderate constipation.   Yes [provider]  prochlorperazine (COMPAZINE) 10 MG tablet Take 1 tablet (10 mg total) by mouth every 6 (six) hours as needed (Nausea or vomiting). 11/03/18  Yes Gorsuch, Ernst Spell, MD  senna (SENOKOT) 8.6 MG tablet Take 2-3 tablets by mouth daily as needed for constipation.    Yes [provider]  sulfamethoxazole-trimethoprim (BACTRIM DS) 800-160 MG tablet Take 1 tablet by mouth 2 (two) times a day. 12/25/18  Yes [provider]  vitamin B-12 (CYANOCOBALAMIN) 1000 MCG tablet Take 1,000 mcg by mouth daily.   Yes [provider]    Family History Family History  Problem Relation Age of Onset  . Cancer Mother        unknown cancer  . Cancer Maternal Aunt        breast ca  . Cancer Maternal Uncle        lung ca  . Cancer Maternal Grandfather        unknown cancer    Social History Social History   Tobacco Use  . Smoking status: Former Smoker    Packs/day: 0.50    Types: Cigarettes  . Smokeless tobacco: Never Used  . Tobacco comment: last smoke week 3/10  Substance Use Topics  . Alcohol use: Never    Frequency: Never  . Drug use: Never     Allergies   Patient has no known allergies.   Review of Systems Review of Systems  Unable to perform ROS: Mental status change     Physical Exam Updated Vital Signs BP (!) 105/56   Pulse 85   Temp (!) 101.8 F (38.8 C) (Rectal)   Resp 16   Ht 5\' 5"  (1.651 m)   Wt 62.6 kg   LMP  (LMP Unknown)   SpO2 100%   BMI 22.96 kg/m   Physical  Exam Vitals signs and nursing note reviewed.  Constitutional:      Appearance: She is well-developed.  HENT:     Head: Normocephalic and atraumatic.     Right Ear: External ear normal.     Left Ear: External ear normal.     Nose: Nose normal.  Eyes:     General:        Right eye: No discharge.        Left eye: No discharge.  Cardiovascular:     Rate and Rhythm: Regular rhythm. Tachycardia present.     Heart  sounds: Normal heart sounds.  Pulmonary:     Effort: Pulmonary effort is normal. Tachypnea present. No accessory muscle usage or respiratory distress.     Breath sounds: Examination of the right-lower field reveals rales. Examination of the left-lower field reveals rales. Rales present.  Abdominal:     Palpations: Abdomen is soft.     Tenderness: There is no abdominal tenderness.  Genitourinary:    Comments: No obvious abscess to perineum or vulva Skin:    General: Skin is warm and dry.  Neurological:     Mental Status: She is alert. She is disoriented.     Comments: Right extremities seem weak but patient does not follow commands well for neuro testing  Psychiatric:        Mood and Affect: Mood is not anxious.      ED Treatments / Results  Labs (all labs ordered are listed, but only abnormal results are displayed) Labs Reviewed  COMPREHENSIVE METABOLIC PANEL - Abnormal; Notable for the following components:      Result Value   Sodium 129 (*)    Chloride 93 (*)    Glucose, Bld 188 (*)    BUN 6 (*)    Calcium 8.7 (*)    Total Protein 8.2 (*)    Albumin 3.0 (*)    All other components within normal limits  CBC WITH DIFFERENTIAL/PLATELET - Abnormal; Notable for the following components:   WBC 0.6 (*)    RBC 2.89 (*)    Hemoglobin 8.9 (*)    HCT 27.7 (*)    RDW 17.9 (*)    All other components within normal limits  CULTURE, BLOOD (ROUTINE X 2)  CULTURE, BLOOD (ROUTINE X 2)  SARS CORONAVIRUS 2 (HOSPITAL ORDER, [facility])  LACTIC  ACID, PLASMA  URINALYSIS, ROUTINE W REFLEX MICROSCOPIC    EKG EKG Interpretation  Date/Time:  Monday Dec 29 2018 13:25:52 EDT Ventricular Rate:  139 PR Interval:    QRS Duration: 75 QT Interval:  283 QTC Calculation: 431 R Axis:   103 Text Interpretation:  Sinus tachycardia Probable left atrial enlargement Right axis deviation Borderline T abnormalities, inferior leads No old tracing to compare Confirmed by Sherwood Gambler [id number]) on 12/29/2018 1:50:07 PM   Radiology Ct Head Wo Contrast  Result Date: 12/29/2018 CLINICAL DATA:  Altered level of consciousness. History of cervical cancer. EXAM: CT HEAD WITHOUT CONTRAST TECHNIQUE: Contiguous axial images were obtained from the base of the skull through the vertex without intravenous contrast. COMPARISON:  None. FINDINGS: Brain: Mild atrophy. Patchy white matter hypodensity bilaterally appears chronic. No acute infarct, hemorrhage, or mass lesion. Vascular: Atherosclerotic calcification in the carotid and vertebral arteries. Skull: Negative Sinuses/Orbits: Negative Other: None IMPRESSION: No acute abnormality. Mild atrophy and mild chronic microvascular ischemic type changes in the white matter. Electronically Signed   By: Franchot Gallo M.D.   On: 12/29/2018 16:37   Dg Chest Portable 1 View  Result Date: 12/29/2018 CLINICAL DATA:  Hypoxia, fever. EXAM: PORTABLE CHEST 1 VIEW COMPARISON:  None. FINDINGS: The heart size and mediastinal contours are within normal limits. Atherosclerosis of thoracic aorta is noted. Right internal jugular Port-A-Cath is unchanged in position. No pneumothorax is noted. Mild bilateral perihilar and basilar opacities are noted concerning for edema or possibly inflammation. Small pleural effusions may be present. The visualized skeletal structures are unremarkable. IMPRESSION: Mild bilateral perihilar and basilar opacities are noted concerning for edema or possibly inflammation. Small pleural effusions. Aortic  Atherosclerosis (ICD10-I70.0). Electronically  Signed   By: Marijo Conception M.D.   On: 12/29/2018 14:23    Procedures .Critical Care Performed by: Sherwood Gambler, MD Authorized by: Sherwood Gambler, MD   Critical care provider statement:    Critical care time (minutes):  30   Critical care time was exclusive of:  Separately billable procedures and treating other patients   Critical care was necessary to treat or prevent imminent or life-threatening deterioration of the following conditions:  Respiratory failure and sepsis   Critical care was time spent personally by me on the following activities:  Discussions with consultants, evaluation of patient's response to treatment, examination of patient, ordering and performing treatments and interventions, ordering and review of laboratory studies, ordering and review of radiographic studies, pulse oximetry, re-evaluation of patient's condition, obtaining history from patient or surrogate and review of old charts   (including critical care time)  Medications Ordered in ED Medications  vancomycin (VANCOCIN) IVPB 1000 mg/200 mL premix (1,000 mg Intravenous New Bag/Given 12/29/18 1640)  ceFEPIme (MAXIPIME) 2 g in sodium chloride 0.9 % 100 mL IVPB (0 g Intravenous Stopped 12/29/18 1539)  acetaminophen (TYLENOL) suppository 650 mg (650 mg Rectal Given 12/29/18 1439)     Initial Impression / Assessment and Plan / ED Course  I have reviewed the triage vital signs and the nursing notes.  Pertinent labs & imaging results that were available during my care of the patient were reviewed by me and considered in my medical decision making (see chart for details).        Patient's altered mental status is likely from the fever and acute infection.  She is neutropenic.  She is immunosuppressed with her chemotherapy.  She was given broad IV antibiotics.  She appears to have right-sided weakness which is likely from the previous stroke although it seems to be  exacerbated by the acute infection.  However with another obvious source of infection from pneumonia on chest x-ray and hypoxia, I highly doubt meningitis.  Will admit to the hospitalist service.  Final Clinical Impressions(s) / ED Diagnoses   Final diagnoses:  Neutropenic fever ([place])  HCAP (healthcare-associated pneumonia)    ED Discharge Orders    None       Sherwood Gambler, MD 12/29/18 1656

## 2018-12-29 NOTE — ED Triage Notes (Addendum)
Brought in by EMS pt from home and lives with family. Pt c/o feeling SHOB since this morning. On EMS arrival pt O2 70s, pt placed on nonrebreather and then began to sat in 90s. Pt was in ED yesterday and treated for Constipation. Per pt did not start feeling SHOB till this morning. Temp 102. Pt has hx of DM and cervical cancer, currently being treated at cancer center.

## 2018-12-29 NOTE — H&P (Signed)
History and Physical    Katie Myers YBO:[id number] DOB: 04-Aug-1946 DOA: 12/29/2018  PCP: [name], MD Patient coming from: home lives with daughter  Chief Complaint: sob  HPI: Katie Myers is a 73 y.o. 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  Patient was brought into the ER yesterday for disimpaction she was disimpacted and sent home.  History is obtained from the patient's records and patient's daughter at [id number].  Patient is not able to provide me with any history according to her daughter patient's baseline is she walks with a cane even though she has a right sided weakness from the previous stroke.  She normally talks.  She denied any fever or cough nausea vomiting or diarrhea in fact she was constipated.  Patient has very decreased p.o. intake.  And she noted her mom acting confused and different than her usual.  She was COVID negative.  No urinary complaints reported.    ED Course: received  vanco and cefepime 137/63 90 16 99 on 2 lit.  CT of the head showed no acute changes.  X-ray showed mild bilateral perihilar and basilar opacities concerning for inflammation or edema and small pleural effusion.  Sodium 129 potassium 3.8 BUN 6 creatinine 0.60 albumin 3.0 AST 30 ALT 20 lactic acid 1.7 a repeat lactic acid was not done as patient's daughter refused.  WBC 0.6 hemoglobin 8.9 platelet count 281.  Neutrophil count is still pending.  Blood culture drawn in the ER.  Review of Systems: As per HPI otherwise all other systems reviewed and are negative  Ambulatory Status:  Past Medical History:  Diagnosis Date   Cancer ([place])    cervical cancer   Diabetes mellitus without complication ([place])    Hyperlipidemia    Hypertension    Knee pain, bilateral    Neuropathy    Stroke ([place])    Underweight      Past Surgical History:  Procedure Laterality Date   IR IMAGING GUIDED PORT INSERTION  11/05/2018    Social History   Socioeconomic History   Marital status: Widowed    Spouse name: Not on file   Number of children: 1   Years of education: Not on file   Highest education level: Not on file  Occupational History   Occupation: retired  Scientist, product/process development strain: Not on file   Food insecurity:    Worry: Not on file    Inability: Not on file   Transportation needs:    Medical: Not on file    Non-medical: Not on file  Tobacco Use   Smoking status: Former Smoker    Packs/day: 0.50    Types: Cigarettes   Smokeless tobacco: Never Used   Tobacco comment: last smoke week 3/10  Substance and Sexual Activity   Alcohol use: Never    Frequency: Never   Drug use: Never   Sexual activity: Not on file  Lifestyle   Physical activity:    Days per week: Not on file    Minutes per session: Not on file   Stress: Not on file  Relationships   Social connections:    Talks on phone: Not on file    Gets together: Not on file    Attends religious service: Not on file    Active member of club  or organization: Not on file    Attends meetings of clubs or organizations: Not on file    Relationship status: Not on file   Intimate partner violence:    Fear of current or ex partner: Not on file    Emotionally abused: Not on file    Physically abused: Not on file    Forced sexual activity: Not on file  Other Topics Concern   Not on file  Social History Narrative   Not on file    No Known Allergies  Family History  Problem Relation Age of Onset   Cancer Mother        unknown cancer   Cancer Maternal Aunt        breast ca   Cancer Maternal Uncle        lung ca   Cancer Maternal Grandfather        unknown cancer      Prior to Admission medications   Medication Sig Start Date End Date Taking? Authorizing Provider  amLODipine (NORVASC) 10  MG tablet Take 1 tablet (10 mg total) by mouth daily. 12/17/18  Yes [name], MD  atorvastatin (LIPITOR) 40 MG tablet Take 40 mg by mouth daily.   Yes [provider]  benazepril (LOTENSIN) 40 MG tablet Take 40 mg by mouth daily.   Yes [provider]  carvedilol (COREG) 12.5 MG tablet Take 1 tablet (12.5 mg total) by mouth 2 (two) times daily with a meal. 12/22/18  Yes [name], MD  clopidogrel (PLAVIX) 75 MG tablet Take 75 mg by mouth daily.  11/30/18  Yes [provider]  dexamethasone (DECADRON) 4 MG tablet Take 2 tabs at the night before chemotherapy, every 3 weeks, by mouth with food Patient taking differently: Take 8 mg by mouth See admin instructions. Take 2 tabs (8mg  total) at the night before chemotherapy, every 3 weeks, by mouth with food 11/17/18  Yes Gorsuch, Ni, MD  DULoxetine (CYMBALTA) 20 MG capsule Take 1 capsule (20 mg total) by mouth daily. Take at night time. 11/12/18  Yes Gorsuch, Ni, MD  HYDROmorphone (DILAUDID) 2 MG tablet Take 1 tablet (2 mg total) by mouth every 6 (six) hours as needed for severe pain. 12/24/18  Yes Gorsuch, Ni, MD  lidocaine-prilocaine (EMLA) cream Apply to affected area once Patient taking differently: Apply 1 application topically See admin instructions. Apply to affected area the night prior to chemo treatments 11/03/18  Yes Gorsuch, Ni, MD  metFORMIN (GLUCOPHAGE) 500 MG tablet Take 500 mg by mouth daily.   Yes [provider]  methadone (DOLOPHINE) 10 MG tablet Take 1 tablet (10 mg total) by mouth every 12 (twelve) hours. 12/24/18  Yes Heath Lark, MD  Multiple Vitamins-Minerals (MULTIVITAMIN WITH MINERALS) tablet Take 1 tablet by mouth daily.   Yes [provider]  ondansetron (ZOFRAN) 8 MG tablet Take 1 tablet (8 mg total) by mouth every 8 (eight) hours as needed for refractory nausea / vomiting. Start on day 3 after chemo. 11/03/18  Yes Gorsuch, Ni, MD  polyethylene glycol powder (GLYCOLAX/MIRALAX) 17  GM/SCOOP powder Take 17 g by mouth daily as needed for mild constipation or moderate constipation.   Yes [provider]  prochlorperazine (COMPAZINE) 10 MG tablet Take 1 tablet (10 mg total) by mouth every 6 (six) hours as needed (Nausea or vomiting). 11/03/18  Yes Gorsuch, Ernst Spell, MD  senna (SENOKOT) 8.6 MG tablet Take 2-3 tablets by mouth daily as needed for constipation.    Yes [provider]  sulfamethoxazole-trimethoprim (BACTRIM DS) 800-160 MG tablet Take 1 tablet by mouth 2 (two) times a day. 12/25/18  Yes [provider]  vitamin B-12 (CYANOCOBALAMIN) 1000 MCG tablet Take 1,000 mcg by mouth daily.   Yes [provider]    Physical Exam: Vitals:   12/29/18 1500 12/29/18 1530 12/29/18 1600 12/29/18 1641  BP: 131/61 (!) 120/56 133/65 (!) 105/56  Pulse: 94 96 91 85  Resp: 17 17 15 16   Temp:      TempSrc:      SpO2: 100% 94% 100% 100%  Weight:      Height:          General: Appears calm and comfortable  Eyes:  PERRL, EOMI, normal lids, iris  ENT: grossly normal hearing, lips & tongue, mmm  Neck: no LAD, masses or thyromegaly  Cardiovascular: RRR, no m/r/g. No LE edema.   Respiratory: Few rhonchi bilaterally, no w/r/r. Normal respiratory effort.  Abdomen:  soft, ntnd, NABS  Skin:  no rash or induration seen on limited exam  Musculoskeletal: grossly normal tone BUE/BLE, good ROM, no bony abnormality  Psychiatric:  grossly normal mood and affect, speech fluent and appropriate, AOx3 Neurologic: Lifts left leg up unable to move right leg unable to wiggle right toes dysarthric  Labs on Admission: I have personally reviewed following labs and imaging studies  CBC: Recent Labs  Lab 12/29/18 1336  WBC 0.6*  NEUTROABS PENDING  HGB 8.9*  HCT 27.7*  MCV 95.8  PLT 008   Basic Metabolic Panel: Recent Labs  Lab 12/29/18 1336  NA 129*  K 3.8  CL 93*  CO2 25  GLUCOSE 188*  BUN 6*  CREATININE 0.60  CALCIUM 8.7*   GFR: Estimated  Creatinine Clearance: 56.4 mL/min (by C-G formula based on SCr of 0.6 mg/dL). Liver Function Tests: Recent Labs  Lab 12/29/18 1336  AST 30  ALT 20  ALKPHOS 85  BILITOT 1.2  PROT 8.2*  ALBUMIN 3.0*   No results for input(s): LIPASE, AMYLASE in the last 168 hours. No results for input(s): AMMONIA in the last 168 hours. Coagulation Profile: No results for input(s): INR, PROTIME in the last 168 hours. Cardiac Enzymes: No results for input(s): CKTOTAL, CKMB, CKMBINDEX, TROPONINI in the last 168 hours. BNP (last 3 results) No results for input(s): PROBNP in the last 8760 hours. HbA1C: No results for input(s): HGBA1C in the last 72 hours. CBG: No results for input(s): GLUCAP in the last 168 hours. Lipid Profile: No results for input(s): CHOL, HDL, LDLCALC, TRIG, CHOLHDL, LDLDIRECT in the last 72 hours. Thyroid Function Tests: No results for input(s): TSH, T4TOTAL, FREET4, T3FREE, THYROIDAB in the last 72 hours. Anemia Panel: No results for input(s): VITAMINB12, FOLATE, FERRITIN, TIBC, IRON, RETICCTPCT in the last 72 hours. Urine analysis:    Component Value Date/Time   COLORURINE AMBER (A) 12/26/2017 1055   APPEARANCEUR CLOUDY (A) 12/26/2017 1055   LABSPEC 1.019 12/26/2017 1055   PHURINE 5.0 12/26/2017 1055   GLUCOSEU NEGATIVE 12/26/2017 1055   HGBUR SMALL (A) 12/26/2017 1055   BILIRUBINUR NEGATIVE 12/26/2017 1055   KETONESUR NEGATIVE 12/26/2017 1055   PROTEINUR 300 (A) 11/27/2018 1120   NITRITE NEGATIVE 12/26/2017 1055   LEUKOCYTESUR LARGE (A) 12/26/2017 1055    Creatinine Clearance: Estimated Creatinine Clearance: 56.4 mL/min (by C-G formula based on SCr of 0.6 mg/dL).  Sepsis Labs: @LABRCNTIP (procalcitonin:4,lacticidven:4) ) Recent Results (from the past 240 hour(s))  Blood Culture (routine x 2)     Status: None (Preliminary  result)   Collection Time: 12/29/18  1:36 PM  Result Value Ref Range Status   Specimen Description   Final    BLOOD LEFT  ANTECUBITAL Performed at [facility], [place] [address] [place], [place] [address]    Special Requests   Final    BOTTLES DRAWN AEROBIC AND ANAEROBIC Blood Culture adequate volume Performed at [facility] [address] [place], [place] [address]    Culture PENDING  Incomplete   Report Status PENDING  Incomplete  Blood Culture (routine x 2)     Status: None (Preliminary result)   Collection Time: 12/29/18  1:52 PM  Result Value Ref Range Status   Specimen Description   Final    BLOOD RIGHT ANTECUBITAL Performed at [facility], [place] [address], [place],  [address]    Special Requests   Final    BOTTLES DRAWN AEROBIC AND ANAEROBIC Blood Culture results may not be optimal due to an excessive volume of blood received in culture bottles Performed at [facility] [address], [place],  [address]    Culture PENDING  Incomplete   Report Status PENDING  Incomplete  SARS Coronavirus 2 (CEPHEID- Performed in [facility], [place] hospital lab), Hosp Order     Status: None   Collection Time: 12/29/18  2:40 PM  Result Value Ref Range Status   SARS Coronavirus 2 NEGATIVE NEGATIVE Final    Comment: (NOTE) If result is NEGATIVE SARS-CoV-2 target nucleic acids are NOT DETECTED. The SARS-CoV-2 RNA is generally detectable in upper and lower  respiratory specimens during the acute phase of infection. The lowest  concentration of SARS-CoV-2 viral copies this assay can detect is 250  copies / mL. A negative result does not preclude SARS-CoV-2 infection  and should not be used as the sole basis for treatment or other  patient management decisions.  A negative result may occur with  improper specimen collection / handling, submission of specimen other  than nasopharyngeal swab, presence of viral mutation(s) within the  areas targeted by this assay, and inadequate number of viral copies  (<250 copies / mL). A negative result must be combined  with clinical  observations, patient history, and epidemiological information. If result is POSITIVE SARS-CoV-2 target nucleic acids are DETECTED. The SARS-CoV-2 RNA is generally detectable in upper and lower  respiratory specimens dur ing the acute phase of infection.  Positive  results are indicative of active infection with SARS-CoV-2.  Clinical  correlation with patient history and other diagnostic information is  necessary to determine patient infection status.  Positive results do  not rule out bacterial infection or co-infection with other viruses. If result is PRESUMPTIVE POSTIVE SARS-CoV-2 nucleic acids MAY BE PRESENT.   A presumptive positive result was obtained on the submitted specimen  and confirmed on repeat testing.  While 2019 novel coronavirus  (SARS-CoV-2) nucleic acids may be present in the submitted sample  additional confirmatory testing may be necessary for epidemiological  and / or clinical management purposes  to differentiate between  SARS-CoV-2 and other Sarbecovirus currently known to infect humans.  If clinically indicated additional testing with an alternate test  methodology [phone]) is advised. The SARS-CoV-2 RNA is generally  detectable in upper and lower respiratory sp ecimens during the acute  phase of infection. The expected result is Negative. Fact Sheet for Patients:  StrictlyIdeas.no Fact Sheet for Healthcare Providers: BankingDealers.co.za This test is not yet approved or cleared by the [place] FDA  and has been authorized for detection and/or diagnosis of SARS-CoV-2 by FDA under an Emergency Use Authorization (EUA).  This EUA will remain in effect (meaning this test can be used) for the duration of the COVID-19 declaration under Section 564(b)(1) of the Act, 21 U.S.C. section 360bbb-3(b)(1), unless the authorization is terminated or revoked sooner. Performed at [facility], [place] [address] [place], [place] [address]      Radiological Exams on Admission: Ct Head Wo Contrast  Result Date: 12/29/2018 CLINICAL DATA:  Altered level of consciousness. History of cervical cancer. EXAM: CT HEAD WITHOUT CONTRAST TECHNIQUE: Contiguous axial images were obtained from the base of the skull through the vertex without intravenous contrast. COMPARISON:  None. FINDINGS: Brain: Mild atrophy. Patchy white matter hypodensity bilaterally appears chronic. No acute infarct, hemorrhage, or mass lesion. Vascular: Atherosclerotic calcification in the carotid and vertebral arteries. Skull: Negative Sinuses/Orbits: Negative Other: None IMPRESSION: No acute abnormality. Mild atrophy and mild chronic microvascular ischemic type changes in the white matter. Electronically Signed   By: Franchot Gallo M.D.   On: 12/29/2018 16:37   Dg Chest Portable 1 View  Result Date: 12/29/2018 CLINICAL DATA:  Hypoxia, fever. EXAM: PORTABLE CHEST 1 VIEW COMPARISON:  None. FINDINGS: The heart size and mediastinal contours are within normal limits. Atherosclerosis of thoracic aorta is noted. Right internal jugular Port-A-Cath is unchanged in position. No pneumothorax is noted. Mild bilateral perihilar and basilar opacities are noted concerning for edema or possibly inflammation. Small pleural effusions may be present. The visualized skeletal structures are unremarkable. IMPRESSION: Mild bilateral perihilar and basilar opacities are noted concerning for edema or possibly inflammation. Small pleural effusions. Aortic Atherosclerosis (ICD10-I70.0). Electronically Signed   By: Marijo Conception M.D.   On: 12/29/2018 14:23    Assessment/Plan Active Problems:   * No active hospital problems. *   #1 neutropenic fever-likely secondary to pneumonia.  Patient is admitted with complaints of shortness of breath and change in mental status.  Chest x-ray shows findings consistent with infiltrates.  Patient has no urinary  complaints.  Will start Vanco and cefepime narrow the antibiotics as cultures come back.  Check MRSA PCR.  I am unsure why she takes Bactrim at home which I will hold for the time being while she is on IV antibiotics.  #2 history of embolic stroke patient is not talking to me or moving her right lower extremity as she used to do before.  At baseline she walked with a cane now she is not able to do that anymore.  She only try to nod her head yes and no to the questions being asked.  CT of the head showed no acute changes with these above new changes I will order an MRI of the brain to rule out any acute changes.  Continue Plavix.   #3 hyponatremia probably secondary to decreased p.o. intake slow hydration with normal saline follow-up labs tomorrow.  #4 history of metastatic cervical cancer followed by Dr. Simeon Craft such as an outpatient.  #5 type 2 diabetes hold metformin and start sliding scale insulin while in the hospital.  #6 hypertension patient takes multiple medications at home including Coreg, Norvasc, Lotensin.  Her blood pressure is soft so I will hold it and may restart as needed.  #7 hyperlipidemia takes Lipitor at home.  #8 chronic pain takes Cymbalta and Dilaudid which I will hold.  She also takes methadone which I will hold due to change in mental status.  Estimated body mass  index is 22.96 kg/m as calculated from the following:   Height as of this encounter: 5\' 5"  (1.651 m).   Weight as of this encounter: 62.6 kg.   DVT prophylaxis Lovenox  code Status: Full code Family Communication: Discussed with daughter Disposition Plan: Patient most likely will need SNF prior to discharge home Consults called: None Admission status: Inpatient   Georgette Shell MD [facility]  If 7PM-7AM, please contact night-coverage [email or web address] Password [email or web address]  12/29/2018, 4:50 PM

## 2018-12-29 NOTE — ED Notes (Signed)
EDP at bedside  

## 2018-12-29 NOTE — ED Notes (Signed)
Spoke to CT. Patient is next. 

## 2018-12-30 ENCOUNTER — Telehealth: Payer: Self-pay | Admitting: Oncology

## 2018-12-30 ENCOUNTER — Telehealth: Payer: Self-pay | Admitting: Hematology and Oncology

## 2018-12-30 ENCOUNTER — Inpatient Hospital Stay (HOSPITAL_COMMUNITY): Payer: Medicare HMO

## 2018-12-30 DIAGNOSIS — J189 Pneumonia, unspecified organism: Secondary | ICD-10-CM

## 2018-12-30 DIAGNOSIS — J90 Pleural effusion, not elsewhere classified: Secondary | ICD-10-CM

## 2018-12-30 DIAGNOSIS — I639 Cerebral infarction, unspecified: Secondary | ICD-10-CM

## 2018-12-30 DIAGNOSIS — C539 Malignant neoplasm of cervix uteri, unspecified: Secondary | ICD-10-CM

## 2018-12-30 DIAGNOSIS — D709 Neutropenia, unspecified: Secondary | ICD-10-CM

## 2018-12-30 DIAGNOSIS — D649 Anemia, unspecified: Secondary | ICD-10-CM

## 2018-12-30 DIAGNOSIS — L8990 Pressure ulcer of unspecified site, unspecified stage: Secondary | ICD-10-CM

## 2018-12-30 DIAGNOSIS — R5081 Fever presenting with conditions classified elsewhere: Secondary | ICD-10-CM

## 2018-12-30 LAB — COMPREHENSIVE METABOLIC PANEL
ALT: 16 U/L (ref 0–44)
AST: 23 U/L (ref 15–41)
Albumin: 2.4 g/dL — ABNORMAL LOW (ref 3.5–5.0)
Alkaline Phosphatase: 61 U/L (ref 38–126)
Anion gap: 5 (ref 5–15)
BUN: 8 mg/dL (ref 8–23)
CO2: 26 mmol/L (ref 22–32)
Calcium: 7.9 mg/dL — ABNORMAL LOW (ref 8.9–10.3)
Chloride: 98 mmol/L (ref 98–111)
Creatinine, Ser: 0.46 mg/dL (ref 0.44–1.00)
GFR calc Af Amer: >60 mL/min (ref >60)
GFR calc non Af Amer: >60 mL/min (ref >60)
Glucose, Bld: 87 mg/dL (ref 70–99)
Potassium: 3.4 mmol/L — ABNORMAL LOW (ref 3.5–5.1)
Sodium: 129 mmol/L — ABNORMAL LOW (ref 135–145)
Total Bilirubin: 1.1 mg/dL (ref 0.3–1.2)
Total Protein: 6.5 g/dL (ref 6.5–8.1)

## 2018-12-30 LAB — GLUCOSE, CAPILLARY
Glucose-Capillary: 73 mg/dL (ref 70–99)
Glucose-Capillary: 104 mg/dL — ABNORMAL HIGH (ref 70–99)
Glucose-Capillary: 88 mg/dL (ref 70–99)
Glucose-Capillary: 74 mg/dL (ref 70–99)

## 2018-12-30 LAB — HEMOGLOBIN A1C
Hgb A1c MFr Bld: 5.3 % (ref 4.8–5.6)
Mean Plasma Glucose: 105.41 mg/dL

## 2018-12-30 LAB — DIFFERENTIAL
Basophils Absolute: 0 10*3/uL (ref 0.0–0.1)
Basophils Relative: 1 %
Eosinophils Absolute: 0 10*3/uL (ref 0.0–0.5)
Eosinophils Relative: 1 %
Lymphocytes Relative: 39 %
Lymphs Abs: 0.8 10*3/uL (ref 0.7–4.0)
Monocytes Absolute: 0.6 10*3/uL (ref 0.1–1.0)
Monocytes Relative: 29 %
Neutro Abs: 0.6 10*3/uL — ABNORMAL LOW (ref 1.7–7.7)
Neutrophils Relative %: 29 %

## 2018-12-30 LAB — CBC
HCT: 22.2 % — ABNORMAL LOW (ref 36.0–46.0)
Hemoglobin: 7.1 g/dL — ABNORMAL LOW (ref 12.0–15.0)
MCH: 31 pg (ref 26.0–34.0)
MCHC: 32 g/dL (ref 30.0–36.0)
MCV: 96.9 fL (ref 80.0–100.0)
Platelets: 258 10*3/uL (ref 150–400)
RBC: 2.29 MIL/uL — ABNORMAL LOW (ref 3.87–5.11)
RDW: 18.6 % — ABNORMAL HIGH (ref 11.5–15.5)
WBC: 2.7 10*3/uL — ABNORMAL LOW (ref 4.0–10.5)
nRBC: 0 % (ref 0.0–0.2)

## 2018-12-30 LAB — PREPARE RBC (CROSSMATCH)

## 2018-12-30 LAB — ECHOCARDIOGRAM COMPLETE
Height: 65 in
Weight: 2208 oz

## 2018-12-30 MED ORDER — TBO-FILGRASTIM 300 MCG/0.5ML ~~LOC~~ SOSY
300.0000 ug | PREFILLED_SYRINGE | Freq: Once | SUBCUTANEOUS | Status: AC
  Administered 2018-12-30: 300 ug via SUBCUTANEOUS
  Filled 2018-12-30: qty 0.5

## 2018-12-30 MED ORDER — SODIUM CHLORIDE 0.9% FLUSH
10.0000 mL | INTRAVENOUS | Status: DC | PRN
  Administered 2019-01-01: 22:00:00 10 mL
  Filled 2018-12-30 (×2): qty 40

## 2018-12-30 MED ORDER — SODIUM CHLORIDE 0.9% FLUSH
10.0000 mL | Freq: Two times a day (BID) | INTRAVENOUS | Status: DC
  Administered 2018-12-30 – 2019-01-05 (×12): 10 mL
  Administered 2019-01-05: 10:00:00 20 mL
  Administered 2019-01-06: 09:00:00 10 mL

## 2018-12-30 MED ORDER — ACETAMINOPHEN 325 MG PO TABS
650.0000 mg | ORAL_TABLET | Freq: Once | ORAL | Status: DC
  Filled 2018-12-30 (×2): qty 2

## 2018-12-30 MED ORDER — ASPIRIN 300 MG RE SUPP
300.0000 mg | Freq: Every day | RECTAL | Status: DC
  Administered 2018-12-30: 19:00:00 300 mg via RECTAL
  Filled 2018-12-30: qty 1

## 2018-12-30 MED ORDER — ATORVASTATIN CALCIUM 40 MG PO TABS
40.0000 mg | ORAL_TABLET | Freq: Every day | ORAL | Status: DC
  Administered 2019-01-01 – 2019-01-06 (×6): 40 mg via ORAL
  Filled 2018-12-30 (×7): qty 1

## 2018-12-30 MED ORDER — POTASSIUM CHLORIDE CRYS ER 20 MEQ PO TBCR: 40.0000 meq | EXTENDED_RELEASE_TABLET | Freq: Once | ORAL | Status: DC

## 2018-12-30 MED ORDER — LABETALOL HCL 5 MG/ML IV SOLN
10.0000 mg | INTRAVENOUS | Status: DC | PRN
  Filled 2018-12-30: qty 4

## 2018-12-30 MED ORDER — SODIUM CHLORIDE 0.9% IV SOLUTION: Freq: Once | INTRAVENOUS | Status: DC

## 2018-12-30 MED ORDER — DIPHENHYDRAMINE HCL 50 MG/ML IJ SOLN
25.0000 mg | Freq: Once | INTRAMUSCULAR | Status: DC
  Filled 2018-12-30: qty 1

## 2018-12-30 MED ORDER — ASPIRIN EC 81 MG PO TBEC: 81.0000 mg | DELAYED_RELEASE_TABLET | Freq: Every day | ORAL | Status: DC

## 2018-12-30 MED ORDER — ALBUTEROL SULFATE (2.5 MG/3ML) 0.083% IN NEBU
2.5000 mg | INHALATION_SOLUTION | Freq: Four times a day (QID) | RESPIRATORY_TRACT | Status: DC | PRN
  Administered 2018-12-30: 19:00:00 2.5 mg via RESPIRATORY_TRACT
  Filled 2018-12-30: qty 3

## 2018-12-30 MED ORDER — POTASSIUM CHLORIDE 10 MEQ/100ML IV SOLN
10.0000 meq | INTRAVENOUS | Status: AC
  Administered 2018-12-30 (×3): 10 meq via INTRAVENOUS
  Filled 2018-12-30 (×3): qty 100

## 2018-12-30 NOTE — Progress Notes (Addendum)
HEMATOLOGY-ONCOLOGY PROGRESS NOTE  SUBJECTIVE: The patient presented to the hospital with shortness of breath.  The patient was noted to have difficulty speaking and at baseline she can normally talk.  Family noted that she was more confused and different than her baseline.  Work-up in the ER occluding CT scan of the head which showed no acute changes.  Chest x-ray showed mild bilateral perihilar and basilar opacities concerning for inflammation or edema and small pleural effusion.  She was found to be hyponatremic.  She also had neutropenia and anemia.  An MRI of the brain was performed which showed acute cortical infarct of the anterior left middle cerebral artery territory.  She is awaiting transfer to [facility] per neurology.  When seen today, the patient is unable to answer questions.  She can answer simple yes or no questions but not consistently.  Denies pain.  Unable to move the right side of her body. Summary of Oncologic history   Cervical cancer ([place])   12/26/2017 Initial Diagnosis    She presented to the ER with symptomatic anemia and had postmenopausal bleeding. Bimanual exam at that time revealed abnormal exam    12/26/2017 Imaging    US pelvis Limited exam due to difficulty with visualization of the uterus and endometrium.  Calcified submucosal fibroid.  Endometrium measures 10 mm. Endometrium is poorly visualized and not adequately assessed. If bleeding remains unresponsive to hormonal or medical therapy, sonohysterogram should be considered for focal lesion work-up    05/29/2018 Pathology Results    1.  Endometrium, biopsy: - Atypical squamous epithelium with dysplastic features highly suspicious for malignancy.  Note: The biopsy specimen shows squamous epithelium and marked acute inflammation with associated individual squamous cells suggestive of tumor diathesis.  These individual squamous cells show koilocytic changes with cytological features approaching high-grade  dysplasia.  Rare keratin pearls are identified.  Areas of more intact squamous epithelium show koilocytic changes, dyskeratosis, and areas of increased mitotic activity (although tissue orientation precludes definitive determination of significance of this finding).  The specimen is negative for endometrial glands.    The clinical findings of a normal cervical exam and upper vaginal nodularity is noted.  Imaging findings of an abnormal uterus are also noted.  The overall findings are extremely concerning for a squamous cell carcinoma, with the differential including vaginal, uterine, or cervical in origin.  A block has been ordered for additional p16 immunohistochemical studies. If clinically indicated, additional tissue sampling may be useful for further diagnosis/classification.  An immunohistochemical stain for p16 is positive (strong blocklike reactivity) on intact fragments of squamous epithelium as well as on detached individual squamous cells. All controls showed appropriate reactivity.   Typically primary squamous cell carcinomas of the uterus are p16-negative, therefore the current positive p16 likely reflects an HPV-driven malignancy of cervical or vaginal origin.  High grade dysplasia with individual cells seen representing an element of degeneration in this sample is also technically possible and remains in the differential, however given the clinical and imaging findings I think this is less likely.  Additional clinical and radiological correlation recommended.    07/23/2018 Imaging    CT abdomen and pelvis IMPRESSION: 1. Abnormal uterus with gas in the endometrial cavity, multiple calcified fibroids, and a right rim-enhancing uterine fluid collection measuring 3.3 x 3.5 cm concerning for abscess image 128. This is contiguous with an edematous anterior abdominal wall.  There is soft tissue extension from the uterus to the right which may represent tumor. The uterus is  overall poorly defined.   2. Pelvic, retroperitoneal, and portal adenopathy 3. Lung nodules as above. CT chest may be of benefit here. 4. Coronary and aortic ASVD 5. Cholelithiasis 6. Bilateral renal cortical cyst 7. Aortoiliac atherosclerosis without aneurysm 8. Diverticulosis without diverticulitis 10. Fecal retention    09/09/2018 Pathology Results    1. 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  2. 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  3. Endometrium, curettage:  -Fragments of squamous cell carcinoma with areas of invasion, see comment.  4.  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  COMMENT: The biopsies all show fragments of squamous neoplasm ranging from squamous cell carcinoma in situ to areas of invasive carcinoma.  The previous biopsy (case [id number]) showed strong positive p16 staining.  This, coupled with the clinical impression and the vaginal biopsy, is consistent with a vaginal/cervical origin for this tumor.  The endometrial curettage shows no evidence of endometrial tissue.    10/21/2018 PET scan    1. Extensive abnormal activity throughout the uterus, consistent with malignancy. 2. Hypermetabolic malignant-appearing retroperitoneal, external iliac, and inguinal adenopathy.  3. Numerous bilateral pulmonary nodules, most of which are below PET resolution with the largest demonstrating low-level activity. These are indeterminate, but suspicious for metastatic disease. 4. Numerous enlarged and hypermetabolic mediastinal and bilateral hilar lymph nodes, concerning for metastatic disease.  5. Small lymph node adjacent to the left thyroid lobe with low level FDG activity is nonspecific.    10/23/2018 Cancer Staging    Staging form: Cervix Uteri, AJCC 8th Edition - Clinical: FIGO Stage IVB (cT2, cN1, cM1) - Signed by Heath Lark,  MD on 10/23/2018    11/05/2018 Procedure    Successful placement of a right IJ approach Power Port with ultrasound and fluoroscopic guidance. The catheter is ready for use.    11/07/2018 -  Chemotherapy    The patient had carboplatin and taxol      REVIEW OF SYSTEMS:   A comprehensive review of systems could not be obtained due to the patient condition.  I have reviewed the past medical history, past surgical history, social history and family history with the patient and they are unchanged from previous note.   PHYSICAL EXAMINATION:  Vitals:   12/29/18 1943 12/30/18 0517  BP: (!) 120/59 (!) 152/72  Pulse: 81 82  Resp: 17 16  Temp: 99 F (37.2 C) 98.3 F (36.8 C)  SpO2: 99% 100%   Filed Weights   12/29/18 1331  Weight: 138 lb (62.6 kg)    Intake/Output from previous day: 05/11 0701 - 05/12 0700 In: 919.3 [I.V.:719.3; IV Piggyback:199.9] Out: 0   GENERAL:alert, no distress and comfortable. She is somewhat drowsy SKIN: skin color, texture, turgor are normal, no rashes or significant lesions EYES: normal, Conjunctiva are pink and non-injected, sclera clear OROPHARYNX:no exudate, no erythema and lips, buccal mucosa, and tongue normal  NECK: supple, thyroid normal size, non-tender, without nodularity LYMPH:  no palpable lymphadenopathy in the cervical, axillary or inguinal LUNGS: clear to auscultation and percussion with normal breathing effort HEART: regular rate & rhythm and no murmurs and no lower extremity edema ABDOMEN:abdomen soft, non-tender and normal bowel sounds Musculoskeletal: Right-sided hemiparesis NEURO: Alert.  Can follow some commands but not consistently.  Asymmetrical smile.  LABORATORY DATA:  I have reviewed the data as listed CMP Latest Ref Rng & Units 12/30/2018  12/29/2018 12/18/2018  Glucose 70 - 99 mg/dL 87 188(H) 194(H)  BUN 8 - 23 mg/dL 8 6(L) 9  Creatinine 0.44 - 1.00 mg/dL 0.46 0.60 0.70  Sodium 135 - 145 mmol/L 129(L) 129(L) 136  Potassium  3.5 - 5.1 mmol/L 3.4(L) 3.8 3.8  Chloride 98 - 111 mmol/L 98 93(L) 98  CO2 22 - 32 mmol/L 26 25 27   Calcium 8.9 - 10.3 mg/dL 7.9(L) 8.7(L) 8.7(L)  Total Protein 6.5 - 8.1 g/dL 6.5 8.2(H) 8.2(H)  Total Bilirubin 0.3 - 1.2 mg/dL 1.1 1.2 0.4  Alkaline Phos 38 - 126 U/L 61 85 89  AST 15 - 41 U/L 23 30 31   ALT 0 - 44 U/L 16 20 17     Lab Results  Component Value Date   WBC 2.7 (L) 12/30/2018   HGB 7.1 (L) 12/30/2018   HCT 22.2 (L) 12/30/2018   MCV 96.9 12/30/2018   PLT 258 12/30/2018   NEUTROABS 0.4 (L) 12/29/2018   I have personally reviewed her imaging studies Ct Head Wo Contrast  Result Date: 12/29/2018 CLINICAL DATA:  Altered level of consciousness. History of cervical cancer. EXAM: CT HEAD WITHOUT CONTRAST TECHNIQUE: Contiguous axial images were obtained from the base of the skull through the vertex without intravenous contrast. COMPARISON:  None. FINDINGS: Brain: Mild atrophy. Patchy white matter hypodensity bilaterally appears chronic. No acute infarct, hemorrhage, or mass lesion. Vascular: Atherosclerotic calcification in the carotid and vertebral arteries. Skull: Negative Sinuses/Orbits: Negative Other: None IMPRESSION: No acute abnormality. Mild atrophy and mild chronic microvascular ischemic type changes in the white matter. Electronically Signed   By: Franchot Gallo M.D.   On: 12/29/2018 16:37   Mr Jeri Cos QG Contrast  Result Date: 12/29/2018 CLINICAL DATA:  Ataxia EXAM: MRI HEAD WITHOUT AND WITH CONTRAST TECHNIQUE: Multiplanar, multiecho pulse sequences of the brain and surrounding structures were obtained without and with intravenous contrast. CONTRAST:  6 mL Gadavist COMPARISON:  Head CT 12/29/2018 FINDINGS: Examination is severely degraded by motion. BRAIN: Abnormal diffusion restriction along the cortex of the left anterior MCA territory. The midline structures are normal. No midline shift or other mass effect. Multifocal white matter hyperintensity, most commonly due to chronic  ischemic microangiopathy. Generalized atrophy without lobar predilection. Susceptibility-sensitive sequences show no chronic microhemorrhage or superficial siderosis. No abnormal contrast enhancement. VASCULAR: The major intracranial arterial and venous sinus flow voids are normal. SKULL AND UPPER CERVICAL SPINE: Calvarial bone marrow signal is normal. There is no skull base mass. Visualized upper cervical spine and soft tissues are normal. SINUSES/ORBITS: No fluid levels or advanced mucosal thickening. No mastoid or middle ear effusion. The orbits are normal. IMPRESSION: 1. 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 No hemorrhage or mass effect. 2. Chronic ischemic microvascular disease. 3. Severely motion degraded study. Electronically Signed   By: Ulyses Jarred M.D.   On: 12/29/2018 19:42   Dg Chest Portable 1 View  Result Date: 12/29/2018 CLINICAL DATA:  Hypoxia, fever. EXAM: PORTABLE CHEST 1 VIEW COMPARISON:  None. FINDINGS: The heart size and mediastinal contours are within normal limits. Atherosclerosis of thoracic aorta is noted. Right internal jugular Port-A-Cath is unchanged in position. No pneumothorax is noted. Mild bilateral perihilar and basilar opacities are noted concerning for edema or possibly inflammation. Small pleural effusions may be present. The visualized skeletal structures are unremarkable. IMPRESSION: Mild bilateral perihilar and basilar opacities are noted concerning for edema or possibly inflammation. Small pleural effusions. Aortic Atherosclerosis (ICD10-I70.0). Electronically Signed   By: Jeneen Rinks  Murlean Caller M.D.   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  PRBCs will be irradiated. I recommend transfusion to keep hemoglobin closer to 8  Neutropenia Due to recent chemotherapy A differential has been added onto this morning CBC We will initiate Granix 300 mcg if ANC is less than 1.5  Cervical cancer [facility]) The patient was seen by GYN oncologist on 12/25/2018  Recommendation was for a repeat PET scan prior to cycle #4, however, will put everything on hold for now If she have positive response to therapy, we will proceed with a few more cycles of treatment, if she recover from stroke  Bilateral pneumonia, neutropenic fever She is currently on broad-spectrum IV antibiotics COVID 19 test was negative  Goals of care Overall, prognosis is poor She has extensive stage cervical cancer and treatment goal was strictly palliative in nature If she does not recover or retain reasonable function after her acute stroke, she will not be able to proceed with further chemotherapy I have updated her next of kin, [name], her niece today For now, I recommend aggressive supportive care I will continue to follow   LOS: 1 day   Mikey Bussing, DNP, AGPCNP-BC, AOCNP 12/30/18  Heath Lark, MD

## 2018-12-30 NOTE — Progress Notes (Signed)
PROGRESS NOTE    Katie Myers  QJJ:[id number] DOB: 04-Oct-1945 DOA: 12/29/2018 PCP: [name], MD   Brief Narrative: Patient is a 73 year old female with history of diabetes type 2, cervical cancer with mets to lungs and lymph node, ischemic stroke, chronic anemia, hypertension who was brought to the emergency department yesterday with complaints of shortness of breath,fever.  She was being admitted for pneumonia and while in the emergency department she  became confused, developed slurred speech and right-sided weakness.  MRI of the brain showed acute cortical infarction of anterior left middle cerebral artery territory. She has been started on IV antibiotics for pneumonia.  Neurology has been consulted and she has been transferred to [facility] today.  She is also being followed by oncology.  Assessment & Plan:   Active Problems:   Cervical cancer ([place])   HCAP (healthcare-associated pneumonia)   Pressure injury of skin   Acute ischemic stroke ([place])  Acute ischemic stroke: MRI finding as above.  Patient is dysarthric, has slurred speech.  She is alert.  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 Stroke work-up initiated.  Speech/PT/OT evaluation.  Will be kept n.p.o. for now.  I have ordered carotid Doppler and echocardiogram.  Neurology consulted and will follow.  She has history of a stroke in the past.  She is on Plavix at home.  I have added aspirin.  Check lipid panel and hemoglobin A1c.  Neutropenic fever: Febrile on presentation.  Suspected secondary to pneumonia.  Follow-up cultures.  Afebrile this morning.  Oncology following.  Oncology planning to initiate Granix .ANC of 783 this morning.  Possible healthcare associated pneumonia: Immunocompromised patient.  Chest x-ray finding consistent with infiltrates.  Continue current antibiotics.  Follow-up cultures.Covid-19 negative.  Hypertension: Mildly hypertensive this morning.  Antihypertensives on hold.  Allow  permissive hypertension.  Gradually normalize blood pressure in 5 to 7 days.  Use PRN meds for hypertensive urgency.  Hyponatremia: Mild.  Continue gentle hydration.  Continue to monitor BMP  History of metastatic cervical cancer: Cervical cancer with mets to lymph nodes and lung.  Dr. Alvy Bimler is following.  Type 2 diabetes mellitus: Continue sliding's insulin.  On metformin at home.  Check hemoglobin A1c.  Hyperlipidemia: On Lipitor at home.  Check fasting lipid panel  Hypokalemia: Supplemented with potassium.  Chronic normocytic anemia: Most likely secondary to malignancy, chemotherapy.  Hemoglobin of 7.1 today.  She has been transfused with 1 unit of PRBC as per oncology.         DVT prophylaxis:Lovenox Code Status: Full Family Communication: Discussed with granddaughter on phone  disposition Plan: Undetermined at this point   Consultants: Neurology, oncology  Procedures: MRI of the brain  Antimicrobials:  Anti-infectives (From admission, onward)   Start     Dose/Rate Route Frequency Ordered Stop   12/30/18 0600  vancomycin (VANCOCIN) 1,250 mg in sodium chloride 0.9 % 250 mL IVPB     1,250 mg 166.7 mL/hr over 90 Minutes Intravenous Every 24 hours 12/29/18 1722     12/29/18 2200  ceFEPIme (MAXIPIME) 2 g in sodium chloride 0.9 % 100 mL IVPB     2 g 200 mL/hr over 30 Minutes Intravenous Every 12 hours 12/29/18 1722     12/29/18 1415  vancomycin (VANCOCIN) IVPB 1000 mg/200 mL premix     1,000 mg 200 mL/hr over 60 Minutes Intravenous  Once 12/29/18 1408 12/29/18 1753   12/29/18 1415  ceFEPIme (MAXIPIME) 2 g in sodium chloride 0.9 % 100  mL IVPB     2 g 200 mL/hr over 30 Minutes Intravenous  Once 12/29/18 1408 12/29/18 1539      Subjective: Patient seen and examined the bedside this morning.  Hemodynamically stable.  Dysarthric with slurred speech.  Speech not understandable.  Left hemiparesis.  Objective: Vitals:   12/29/18 1730 12/29/18 1800 12/29/18 1943 12/30/18  0517  BP: (!) 128/57 118/62 (!) 120/59 (!) 152/72  Pulse: 86 85 81 82  Resp: 17 15 17 16   Temp:   99 F (37.2 C) 98.3 F (36.8 C)  TempSrc:   Oral Oral  SpO2: 96% 98% 99% 100%  Weight:      Height:        Intake/Output Summary (Last 24 hours) at 12/30/2018 1043 Last data filed at 12/30/2018 5956 Gross per 24 hour  Intake 919.25 ml  Output 0 ml  Net 919.25 ml   Filed Weights   12/29/18 1331  Weight: 62.6 kg    Examination:  General exam: Not in distress, frail elderly female HEENT:PERRL,Oral mucosa moist, right facial droop Respiratory system: Bilateral equal air entry, normal vesicular breath sounds, no wheezes or crackles  Cardiovascular system: S1 & S2 heard, RRR. No JVD, murmurs, rubs, gallops or clicks. No pedal edema.  Chemo-Port on the right chest Gastrointestinal system: Abdomen is nondistended, soft and nontender. No organomegaly or masses felt. Normal bowel sounds heard. Central nervous system: Alert, orientation could not be determined due to patient's mental status.  Dysarthria.  Dense right hemiplegia. Motor: RUE,RLE: 2/5 LUE:5/5 LLE: 4/5 Extremities: No edema, no clubbing ,no cyanosis, distal peripheral pulses palpable. Skin: No rashes, lesions or ulcers,no icterus ,no pallor   Data Reviewed: I have personally reviewed following labs and imaging studies  CBC: Recent Labs  Lab 12/29/18 1336 12/30/18 0639  WBC 0.6* 2.7*  NEUTROABS 0.4*  --   HGB 8.9* 7.1*  HCT 27.7* 22.2*  MCV 95.8 96.9  PLT 281 387   Basic Metabolic Panel: Recent Labs  Lab 12/29/18 1336 12/30/18 0639  NA 129* 129*  K 3.8 3.4*  CL 93* 98  CO2 25 26  GLUCOSE 188* 87  BUN 6* 8  CREATININE 0.60 0.46  CALCIUM 8.7* 7.9*   GFR: Estimated Creatinine Clearance: 56.4 mL/min (by C-G formula based on SCr of 0.46 mg/dL). Liver Function Tests: Recent Labs  Lab 12/29/18 1336 12/30/18 0639  AST 30 23  ALT 20 16  ALKPHOS 85 61  BILITOT 1.2 1.1  PROT 8.2* 6.5  ALBUMIN 3.0* 2.4*    No results for input(s): LIPASE, AMYLASE in the last 168 hours. No results for input(s): AMMONIA in the last 168 hours. Coagulation Profile: No results for input(s): INR, PROTIME in the last 168 hours. Cardiac Enzymes: No results for input(s): CKTOTAL, CKMB, CKMBINDEX, TROPONINI in the last 168 hours. BNP (last 3 results) No results for input(s): PROBNP in the last 8760 hours. HbA1C: Recent Labs    12/30/18 0639  HGBA1C 5.3   CBG: Recent Labs  Lab 12/29/18 2053 12/30/18 0744  GLUCAP 134* 73   Lipid Profile: No results for input(s): CHOL, HDL, LDLCALC, TRIG, CHOLHDL, LDLDIRECT in the last 72 hours. Thyroid Function Tests: No results for input(s): TSH, T4TOTAL, FREET4, T3FREE, THYROIDAB in the last 72 hours. Anemia Panel: No results for input(s): VITAMINB12, FOLATE, FERRITIN, TIBC, IRON, RETICCTPCT in the last 72 hours. Sepsis Labs: Recent Labs  Lab 12/29/18 1336  LATICACIDVEN 1.7    Recent Results (from the past 240 hour(s))  Blood Culture (  routine x 2)     Status: None (Preliminary result)   Collection Time: 12/29/18  1:36 PM  Result Value Ref Range Status   Specimen Description   Final    BLOOD LEFT ANTECUBITAL Performed at [facility], [place] [address] [place], [place] [address]    Special Requests   Final    BOTTLES DRAWN AEROBIC AND ANAEROBIC Blood Culture adequate volume Performed at [facility] [address], [place], [place] [address]    Culture PENDING  Incomplete   Report Status PENDING  Incomplete  Blood Culture (routine x 2)     Status: None (Preliminary result)   Collection Time: 12/29/18  1:52 PM  Result Value Ref Range Status   Specimen Description   Final    BLOOD RIGHT ANTECUBITAL Performed at [facility], [place] [address], [place], [place] [address]    Special Requests   Final    BOTTLES DRAWN AEROBIC AND ANAEROBIC Blood Culture results may not be optimal due to an excessive volume of blood received in  culture bottles Performed at [facility] [address] [place], [place] [address]    Culture PENDING  Incomplete   Report Status PENDING  Incomplete  SARS Coronavirus 2 (CEPHEID- Performed in [facility] hospital lab), Hosp Order     Status: None   Collection Time: 12/29/18  2:40 PM  Result Value Ref Range Status   SARS Coronavirus 2 NEGATIVE NEGATIVE Final    Comment: (NOTE) If result is NEGATIVE SARS-CoV-2 target nucleic acids are NOT DETECTED. The SARS-CoV-2 RNA is generally detectable in upper and lower  respiratory specimens during the acute phase of infection. The lowest  concentration of SARS-CoV-2 viral copies this assay can detect is 250  copies / mL. A negative result does not preclude SARS-CoV-2 infection  and should not be used as the sole basis for treatment or other  patient management decisions.  A negative result may occur with  improper specimen collection / handling, submission of specimen other  than nasopharyngeal swab, presence of viral mutation(s) within the  areas targeted by this assay, and inadequate number of viral copies  (<250 copies / mL). A negative result must be combined with clinical  observations, patient history, and epidemiological information. If result is POSITIVE SARS-CoV-2 target nucleic acids are DETECTED. The SARS-CoV-2 RNA is generally detectable in upper and lower  respiratory specimens dur ing the acute phase of infection.  Positive  results are indicative of active infection with SARS-CoV-2.  Clinical  correlation with patient history and other diagnostic information is  necessary to determine patient infection status.  Positive results do  not rule out bacterial infection or co-infection with other viruses. If result is PRESUMPTIVE POSTIVE SARS-CoV-2 nucleic acids MAY BE PRESENT.   A presumptive positive result was obtained on the submitted specimen  and confirmed on repeat testing.  While 2019 novel coronavirus    (SARS-CoV-2) nucleic acids may be present in the submitted sample  additional confirmatory testing may be necessary for epidemiological  and / or clinical management purposes  to differentiate between  SARS-CoV-2 and other Sarbecovirus currently known to infect humans.  If clinically indicated additional testing with an alternate test  methodology [phone]) is advised. The SARS-CoV-2 RNA is generally  detectable in upper and lower respiratory sp ecimens during the acute  phase of infection. The expected result is Negative. Fact Sheet for Patients:  StrictlyIdeas.no Fact Sheet for Healthcare Providers: BankingDealers.co.za This test  is not yet approved or cleared by the [place] and has been authorized for detection and/or diagnosis of SARS-CoV-2 by FDA under an Emergency Use Authorization (EUA).  This EUA will remain in effect (meaning this test can be used) for the duration of the COVID-19 declaration under Section 564(b)(1) of the Act, 21 U.S.C. section 360bbb-3(b)(1), unless the authorization is terminated or revoked sooner. Performed at [facility], [place] [address], [place], [place] [address]   MRSA PCR Screening     Status: None   Collection Time: 12/29/18  5:23 PM  Result Value Ref Range Status   MRSA by PCR NEGATIVE NEGATIVE Final    Comment:        The GeneXpert MRSA Assay (FDA approved for NASAL specimens only), is one component of a comprehensive MRSA colonization surveillance program. It is not intended to diagnose MRSA infection nor to guide or monitor treatment for MRSA infections. Performed at [facility], [place] [address], [place], [place] [address]          Radiology Studies: Ct Head Wo Contrast  Result Date: 12/29/2018 CLINICAL DATA:  Altered level of consciousness. History of cervical cancer. EXAM: CT HEAD WITHOUT CONTRAST TECHNIQUE: Contiguous axial images  were obtained from the base of the skull through the vertex without intravenous contrast. COMPARISON:  None. FINDINGS: Brain: Mild atrophy. Patchy white matter hypodensity bilaterally appears chronic. No acute infarct, hemorrhage, or mass lesion. Vascular: Atherosclerotic calcification in the carotid and vertebral arteries. Skull: Negative Sinuses/Orbits: Negative Other: None IMPRESSION: No acute abnormality. Mild atrophy and mild chronic microvascular ischemic type changes in the white matter. Electronically Signed   By: Franchot Gallo M.D.   On: 12/29/2018 16:37   Mr Jeri Cos CH Contrast  Result Date: 12/29/2018 CLINICAL DATA:  Ataxia EXAM: MRI HEAD WITHOUT AND WITH CONTRAST TECHNIQUE: Multiplanar, multiecho pulse sequences of the brain and surrounding structures were obtained without and with intravenous contrast. CONTRAST:  6 mL Gadavist COMPARISON:  Head CT 12/29/2018 FINDINGS: Examination is severely degraded by motion. BRAIN: Abnormal diffusion restriction along the cortex of the left anterior MCA territory. The midline structures are normal. No midline shift or other mass effect. Multifocal white matter hyperintensity, most commonly due to chronic ischemic microangiopathy. Generalized atrophy without lobar predilection. Susceptibility-sensitive sequences show no chronic microhemorrhage or superficial siderosis. No abnormal contrast enhancement. VASCULAR: The major intracranial arterial and venous sinus flow voids are normal. SKULL AND UPPER CERVICAL SPINE: Calvarial bone marrow signal is normal. There is no skull base mass. Visualized upper cervical spine and soft tissues are normal. SINUSES/ORBITS: No fluid levels or advanced mucosal thickening. No mastoid or middle ear effusion. The orbits are normal. IMPRESSION: 1. 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 No hemorrhage or mass effect. 2. Chronic ischemic microvascular disease. 3. Severely motion degraded study.  Electronically Signed   By: Ulyses Jarred M.D.   On: 12/29/2018 19:42   Dg Chest Portable 1 View  Result Date: 12/29/2018 CLINICAL DATA:  Hypoxia, fever. EXAM: PORTABLE CHEST 1 VIEW COMPARISON:  None. FINDINGS: The heart size and mediastinal contours are within normal limits. Atherosclerosis of thoracic aorta is noted. Right internal jugular Port-A-Cath is unchanged in position. No pneumothorax is noted. Mild bilateral perihilar and basilar opacities are noted concerning for edema or possibly inflammation. Small pleural effusions may be present. The visualized skeletal structures are unremarkable. IMPRESSION: Mild bilateral perihilar and basilar opacities are noted concerning for edema or possibly inflammation. Small  pleural effusions. Aortic Atherosclerosis (ICD10-I70.0). Electronically Signed   By: Marijo Conception M.D.   On: 12/29/2018 14:23   Vas US Carotid  Result Date: 12/30/2018 Carotid Arterial Duplex Study Indications: TIA. Performing Technologist: Oliver Hum RVT  Examination Guidelines: A complete evaluation includes B-mode imaging, spectral Doppler, color Doppler, and power Doppler as needed of all accessible portions of each vessel. Bilateral testing is considered an integral part of a complete examination. Limited examinations for reoccurring indications may be performed as noted.  Right Carotid Findings: +----------+--------+--------+--------+-----------------------+--------+             PSV cm/s EDV cm/s Stenosis Describe                Comments  +----------+--------+--------+--------+-----------------------+--------+  CCA Prox   62       8                 smooth and heterogenous           +----------+--------+--------+--------+-----------------------+--------+  CCA Distal 68       16                smooth and heterogenous           +----------+--------+--------+--------+-----------------------+--------+  ICA Prox   122      47                smooth and heterogenous tortuous   +----------+--------+--------+--------+-----------------------+--------+  ICA Distal 62       21                                        tortuous  +----------+--------+--------+--------+-----------------------+--------+  ECA        115      9                                                   +----------+--------+--------+--------+-----------------------+--------+ +----------+--------+-------+--------+-------------------+             PSV cm/s EDV cms Describe Arm Pressure (mmHG)  +----------+--------+-------+--------+-------------------+  Subclavian 134                                            +----------+--------+-------+--------+-------------------+ +---------+--------+--+--------+--+---------+  Vertebral PSV cm/s 60 EDV cm/s 17 Antegrade  +---------+--------+--+--------+--+---------+  Left Carotid Findings: +----------+--------+-------+--------+--------------------------------+--------+             PSV cm/s EDV     Stenosis Describe                         Comments                       cm/s                                                        +----------+--------+-------+--------+--------------------------------+--------+  CCA Prox   80       13  smooth and heterogenous                    +----------+--------+-------+--------+--------------------------------+--------+  CCA Distal 48       10               smooth and heterogenous                    +----------+--------+-------+--------+--------------------------------+--------+  ICA Prox   74       13               smooth, heterogenous and                                                         calcific                                   +----------+--------+-------+--------+--------------------------------+--------+  ICA Distal 41       16                                                tortuous  +----------+--------+-------+--------+--------------------------------+--------+  ECA        179      20                                                           +----------+--------+-------+--------+--------------------------------+--------+ +----------+--------+--------+--------+-------------------+  Subclavian PSV cm/s EDV cm/s Describe Arm Pressure (mmHG)  +----------+--------+--------+--------+-------------------+             158                                             +----------+--------+--------+--------+-------------------+ +---------+--------+--+--------+--+---------+  Vertebral PSV cm/s 60 EDV cm/s 22 Antegrade  +---------+--------+--+--------+--+---------+  Summary: Right Carotid: Velocities in the right ICA are consistent with a 40-59%                stenosis. Left Carotid: Velocities in the left ICA are consistent with a 1-39% stenosis. Vertebrals: Bilateral vertebral arteries demonstrate antegrade flow. *See table(s) above for measurements and observations.     Preliminary         Scheduled Meds:  sodium chloride   Intravenous Once   acetaminophen  650 mg Oral Once   aspirin EC  81 mg Oral Daily   clopidogrel  75 mg Oral Daily   diphenhydrAMINE  25 mg Intravenous Once   enoxaparin (LOVENOX) injection  40 mg Subcutaneous Q24H   insulin aspart  0-5 Units Subcutaneous QHS   insulin aspart  0-9 Units Subcutaneous TID WC   sodium chloride flush  10-40 mL Intracatheter Q12H   Continuous Infusions:  sodium chloride 75 mL/hr at 12/30/18 0909   ceFEPime (MAXIPIME) IV 2 g (12/30/18 1019)   potassium chloride 10 mEq (12/30/18 0912)   vancomycin 1,250 mg (12/30/18 0508)  LOS: 1 day    Time spent: 35 mins.More than 50% of that time was spent in counseling and/or coordination of care.      Shelly Coss, MD [facility] Pager [phone]  If 7PM-7AM, please contact night-coverage [email or web address] Password [facility] 12/30/2018, 10:43 AM

## 2018-12-30 NOTE — Progress Notes (Signed)
Initial Nutrition Assessment  RD working remotely.   DOCUMENTATION CODES:   (unable to assess for malnutrition at this time.)  INTERVENTION:  - diet advancement as medically feasible. - will provide appropriate interventions/recommendations at follow-up.   NUTRITION DIAGNOSIS:   Increased nutrient needs related to chronic illness, cancer and cancer related treatments, wound healing as evidenced by estimated needs.  GOAL:   Patient will meet greater than or equal to 90% of their needs  MONITOR:   Diet advancement, Labs, Weight trends, Skin  REASON FOR ASSESSMENT:   Other (Comment)(pressure injury report)  ASSESSMENT:   73 y.o. female with medical history significant of type 2 DM, cervical cancer with mets to lung and lymph nodes undergoing chemotherapy, chronic iron deficiency anemia, stroke with associated R-sided weakness, and HTN. She presented to the ED d/t SOB. She had also presented to the ED the day prior d/t inability to have a BM. Disimpaction done at that time and patient had been discharged home. No N/V/D, cough, or fever PTA. Patient very poor PO intakes PTA, per family report. COVID-19 negative in the ED.  BMI indicates normal weight. Patient has been NPO since admission. RN flow sheet and notes have all indicated that patient is unable to communicate/respond to orientation questions/unable to talk on the phone.   Per chart review, current weight is 138 lb. It appears that weight has been fluctuating (131-139 lb) over the past 2 months. Will continue to monitor weight trends throughout hospitalization.  [place] was able to communicate with patient's family via phone on 3/30 at which time it was encouraged that patient eat small, frequent meals and snacks and to consume Boost Plus TID. Handouts had been mailed to patient/family at that time as well.   Per notes, plan is to transfer patient to [facility] when feasible d/t acute ischemic stroke. Patient also noted  to have neutropenic fever, possible HCAP, mild hyponatremia, and mild hypokalemia.      Medications reviewed; sliding scale novolog, 10 mEq IV KCl x3 runs 5/12. Labs reviewed; CBGs: 73 and 104 mg/dl today, Na: 129 mmol/l, K: 3.4 mmol/l, Ca: 7.9 mg/dl. IVF; NS @ 75 ml/hr.     NUTRITION - FOCUSED PHYSICAL EXAM:  unable to perform at this time.   Diet Order:   Diet Order            Diet NPO time specified  Diet effective now              EDUCATION NEEDS:   Not appropriate for education at this time  Skin:  Skin Assessment: Skin Integrity Issues: Skin Integrity Issues:: Stage II Stage II: L hip  Last BM:  5/10  Height:   Ht Readings from Last 1 Encounters:  12/29/18 5\' 5"  (1.651 m)    Weight:   Wt Readings from Last 1 Encounters:  12/29/18 62.6 kg    Ideal Body Weight:  56.8 kg  BMI:  Body mass index is 22.96 kg/m.  Estimated Nutritional Needs:   Kcal:  6720-9470 kcal  Protein:  100-115 grams  Fluid:  >/= 2.1 L/day     Jarome Matin, MS, RD, LDN, [facility] Inpatient Clinical Dietitian Pager # [phone] After hours/weekend pager # [phone]

## 2018-12-30 NOTE — Progress Notes (Signed)
I have reviewed documentation by my NP, examined the patient, agreed with the assessment and plan and discussed goals of care with [name]

## 2018-12-30 NOTE — Progress Notes (Signed)
Echocardiogram 2D Echocardiogram has been performed.  Darlina Sicilian M 12/30/2018, 11:33 AM

## 2018-12-30 NOTE — Progress Notes (Signed)
HGB 7.1, first unit of blood running at this time with no reaction will continue monitor

## 2018-12-30 NOTE — Progress Notes (Signed)
PHARMACY NOTE -  Abx  Pharmacy has been assisting with dosing of Cefepime for PNA and febrile neutropenia.  Vanc stopped today with negative MRSA PCR; patient no longer neutropenic  Cefepime dosage remains stable at 2g IV q12 hr and need for further dosage adjustment appears unlikely at present given stable renal function  Pharmacy will sign off, following peripherally for culture results or dose adjustments. Please reconsult if a change in clinical status warrants re-evaluation of dosage.  Reuel Boom, PharmD, BCPS [phone] 12/30/2018, 1:39 PM

## 2018-12-30 NOTE — Telephone Encounter (Signed)
Scheduled appts per sch msg. Called and spoke with patients niece. Patient is currently in hospital, had a stroke. Niece says they will keep appts and will call to confirm or cancel if needed depending on health of patient.

## 2018-12-30 NOTE — Progress Notes (Signed)
New Admission Note:  Arrival Method: Via [facility] Mental Orientation: Unable to assess due to pt's speech being incomprehensible  Telemetry:3w15 Assessment: Completed Skin: Pt has a stage II on L hip, stage I on sacrum, abrasion on both elbows bilaterally IV: R chest port Pain:0/10 Safety Measures: Safety Fall Prevention Plan was given, discussed. 3G14: Patient has been orientated to the room, unit and the staff.  Pt had orders to transfuse blood @ [facility] at 0900 am. PT arrived at 1600 and blood had not been transfused by the time pt arrived @ [facility]. [place] RN reported that pt's blood had to irradiated and was not going to be ready @ time of transfer. RN notified blood bank of pt's arrival, and informed oncoming RN of events  Orders have been reviewed and implemented. Will continue to monitor the patient. Call light has been placed within reach and bed alarm has been activated.   Arta Silence ,RN

## 2018-12-30 NOTE — Telephone Encounter (Signed)
Called Dr. Shelba Flake office and spoke to [place], [place].  Advised her that Keiana has been admitted to the hospital with a stroke.  Debbie said she will let Dr. Polly Cobia know and will cancel her PET scan.

## 2018-12-30 NOTE — Progress Notes (Signed)
Carotid artery duplex has been completed. Preliminary results can be found in CV Proc through chart review.   12/30/18 9:14 AM Katie Myers RVT

## 2018-12-30 NOTE — Consult Note (Addendum)
Neurology Consultation  Reason for Consult: Stroke Referring Physician: Regalado  CC: Aphasia  History is obtained from: Chart  HPI: Katie Myers is a 73 y.o. female with past medical history of stroke, neuropathy, knee pain, hypertension, mets to the lung and lymph nodes, chronic anemia, hyperlipidemia and diabetes.  Patient is a phasic thus the majority of the information was obtained from the chart.  Patient lives at home with her daughter and was brought to the emergency department secondary to constipation 2 days ago.  At that time she was also released after having a large bowel movement.  Patient was brought back to the ED yesterday secondary to shortness of breath.  EMS arrived at the house and O2 sats were in the 37s.  Nurse at the bedside of the ED reported that she was in the 80s at room air.  This is apparently new for the patient..  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  She usually is able to talk and converse.  While in the ED it was noted that she was unable to talk and was not moving her right side.  CT of head was obtained to evaluate for further strokes.  Will admitted to the hospital MRI was obtained which showed a acute cortical infarct of the anterior left middle cerebral artery.  Due to this reason patient was transferred to [facility] for further stroke follow-up and further medical treatment for pneumonia.  LKW: 12/29/2018 with unknown time tpa given?: no, out of window Premorbid modified Rankin scale (mRS): 4 NIH stroke scale: 18   ROS:  Unable to obtain due expressive and receptive aphasia.   Past Medical History:  Diagnosis Date  . Cancer (HCC)    cervical cancer  . Diabetes mellitus without complication ([place])   . Hyperlipidemia   . Hypertension   . Knee pain, bilateral   . Neuropathy   . Stroke ([place])   . Underweight     Family History  Problem Relation Age of Onset  . Cancer Mother     unknown cancer  . Cancer Maternal Aunt        breast ca  . Cancer Maternal Uncle        lung ca  . Cancer Maternal Grandfather        unknown cancer   Social History:   reports that she has quit smoking. Her smoking use included cigarettes. She smoked 0.50 packs per day. She has never used smokeless tobacco. She reports that she does not drink alcohol or use drugs.  Medications  Current Facility-Administered Medications:  .  0.9 %  sodium chloride infusion (Manually program via Guardrails IV Fluids), , Intravenous, Once, Curcio, Kristin R, NP .  0.9 %  sodium chloride infusion, , Intravenous, Continuous, Adhikari, Amrit, MD, Last Rate: 75 mL/hr at 12/30/18 0909 .  acetaminophen (TYLENOL) tablet 650 mg, 650 mg, Oral, Once, Curcio, Kristin R, NP .  aspirin EC tablet 81 mg, 81 mg, Oral, Daily, Adhikari, Amrit, MD .  atorvastatin (LIPITOR) tablet 40 mg, 40 mg, Oral, Daily, Adhikari, Amrit, MD .  ceFEPIme (MAXIPIME) 2 g in sodium chloride 0.9 % 100 mL IVPB, 2 g, Intravenous, Q12H, Wofford, Drew A, RPH, Last Rate: 200 mL/hr at 12/30/18 1019, 2 g at 12/30/18 1019 .  clopidogrel (PLAVIX) tablet 75 mg, 75 mg, Oral, Daily, Georgette Shell, MD .  diphenhydrAMINE (BENADRYL) injection 25 mg, 25 mg, Intravenous, Once, Curcio, [employer]  R, NP .  enoxaparin (LOVENOX) injection 40 mg, 40 mg, Subcutaneous, Q24H, Georgette Shell, MD, 40 mg at 12/29/18 2125 .  insulin aspart (novoLOG) injection 0-5 Units, 0-5 Units, Subcutaneous, QHS, Georgette Shell, MD .  insulin aspart (novoLOG) injection 0-9 Units, 0-9 Units, Subcutaneous, TID WC, Georgette Shell, MD .  labetalol (NORMODYNE) injection 10 mg, 10 mg, Intravenous, Q2H PRN, Adhikari, Amrit, MD .  sodium chloride flush (NS) 0.9 % injection 10-40 mL, 10-40 mL, Intracatheter, Q12H, Adhikari, Amrit, MD .  sodium chloride flush (NS) 0.9 % injection 10-40 mL, 10-40 mL, Intracatheter, PRN, Shelly Coss, MD   Exam: Current vital signs: BP  (!) 146/66 (BP Location: Left Arm)   Pulse 80   Temp 98.5 F (36.9 C) (Oral)   Resp 16   Ht 5\' 5"  (1.651 m)   Wt 62.6 kg   LMP  (LMP Unknown)   SpO2 100%   BMI 22.96 kg/m  Vital signs in last 24 hours: Temp:  [98.3 F (36.8 C)-99 F (37.2 C)] 98.5 F (36.9 C) (05/12 1341) Pulse Rate:  [80-86] 80 (05/12 1341) Resp:  [15-17] 16 (05/12 0517) BP: (118-152)/(57-72) 146/66 (05/12 1341) SpO2:  [96 %-100 %] 100 % (05/12 1341)  Physical Exam  Constitutional: Appears well-developed and well-nourished.  Psych: A phasic Eyes: No scleral injection HENT: No OP obstrucion Head: Normocephalic.  Cardiovascular: Normal rate and regular rhythm.  Respiratory: Effort normal, non-labored breathing GI: Soft.  No distension. There is no tenderness.  Skin: WDI  Neuro: Mental Status: Patient is awake, cannot follow commands or express her self secondary to aphasia unable to even follow visual commands Patient is unable to give history Shows expressive and receptive aphasia Cranial Nerves: II: Right hemianopsia III,IV, VI: Patient does not appear to be able to cross midline to the right without ptosis or diploplia. Pupils equal, round and reactive to light V: Facial sensation is symmetric to temperature VII: Right facial droop VIII: hearing is intact to voice Motor: Left arm and leg have 5/5 strength and able to hold antigravity.  Right arm flaccid and left leg flaccid Sensory: Sensation is symmetric to noxious stimuli Deep Tendon Reflexes: 2+ and symmetric in the biceps and patellae.  Plantars: Toes upgoing left toe is downgoing Cerebellar: Did not get patient to follow commands to do this exam  Labs I have reviewed labs in epic and the results pertinent to this consultation are:   CBC    Component Value Date/Time   WBC 2.7 (L) 12/30/2018 0639   RBC 2.29 (L) 12/30/2018 0639   HGB 7.1 (L) 12/30/2018 0639   HGB 9.8 (L) 12/18/2018 1118   HCT 22.2 (L) 12/30/2018 0639   PLT 258  12/30/2018 0639   PLT 416 (H) 12/18/2018 1118   MCV 96.9 12/30/2018 0639   MCH 31.0 12/30/2018 0639   MCHC 32.0 12/30/2018 0639   RDW 18.6 (H) 12/30/2018 0639   LYMPHSABS 0.8 12/30/2018 0639   MONOABS 0.6 12/30/2018 0639   EOSABS 0.0 12/30/2018 0639   BASOSABS 0.0 12/30/2018 0639    CMP     Component Value Date/Time   NA 129 (L) 12/30/2018 0639   K 3.4 (L) 12/30/2018 0639   CL 98 12/30/2018 0639   CO2 26 12/30/2018 0639   GLUCOSE 87 12/30/2018 0639   BUN 8 12/30/2018 0639   CREATININE 0.46 12/30/2018 0639   CREATININE 0.70 12/18/2018 1118   CALCIUM 7.9 (L) 12/30/2018 0639   PROT 6.5 12/30/2018 6237  ALBUMIN 2.4 (L) 12/30/2018 0639   AST 23 12/30/2018 0639   AST 31 12/18/2018 1118   ALT 16 12/30/2018 0639   ALT 17 12/18/2018 1118   ALKPHOS 61 12/30/2018 0639   BILITOT 1.1 12/30/2018 0639   BILITOT 0.4 12/18/2018 1118   GFRNONAA >60 12/30/2018 0639   GFRNONAA >60 12/18/2018 1118   GFRAA >60 12/30/2018 0639   GFRAA >60 12/18/2018 1118    Lipid Panel     Component Value Date/Time   CHOL 109 11/27/2018 1034   TRIG 60 11/27/2018 1034   HDL 40 (L) 11/27/2018 1034   CHOLHDL 2.7 11/27/2018 1034   VLDL 12 11/27/2018 1034   LDLCALC 57 11/27/2018 1034     Imaging I have reviewed the images obtained:  CT-scan of the brain-shows no acute infarct  MRI examination of the brain- shows acute cortical infarct of the anterior left MCA artery territory with no hemorrhage or mass-effect.  Echocardiogram- left ventricle has normal systolic function with an EF of 60 to 65%.  Trivial pericardial effusion.  Atrial valve is abnormal.  Mild thickening of the mitral valve leaflet.  With severe mitral annular calcification.  No arterial level shunt detected  Etta Quill PA-C [facility] [phone]  M-F  (9:00 am- 5:00 PM)  12/30/2018, 5:12 PM     Assessment:  73 year old female presented hospital with shortness of breath and inability to express her self.  CT of  head was obtained showing no acute infarct however follow-up MRI did show an acute cortical infarct of the anterior left middle cerebral artery.  At this time she has both expressive and receptively a phasic.   Impression: -Stroke -Aphasia -Hyponatremia - H CAP - History of metastatic cervical cancer with mets to lymph nodes and lung  Recommend #CTA of head and neck #continue Atorvastatin given lipid profile may need to go up to 80 mg/other high intensity statin #Echocardiogram # BP goal: permissive HTN upto 220/120 mmHg # HBAIC and Lipid profile # Telemetry monitoring # Frequent neuro checks # NPO until passes stroke swallow screen #Speech therapy to evaluate for ongoing aspiration # please page stroke NP  Or  PA  Or MD from 8am -4 pm  as this patient from this time will be  followed by the stroke.   You can look them up on [email or web address]  Password [email or web address]   NEUROHOSPITALIST ADDENDUM Performed a face to face diagnostic evaluation.   I have reviewed the contents of history and physical exam as documented by PA/ARNP/Resident and agree with above documentation.  I have discussed and formulated the above plan as documented. Edits to the note have been made as needed.    73 year old female with past medical history of metastatic cervical cancer, hyperlipidemia, hypertension, prior CVA with diabetes admitted to the hospital on 5/11 for H CAP neutropenic fever after presenting with fever and shortness of breath.  On reviewing the chart, it appears patient became confused sometime while in the emergency room.  On assessment by hospitalist noted to be aphasic and have right hemiparesis.  CT head obtained initially did not show an acute stroke, however MRI brain performed later in the day performed showed a large left MCA infarction. Suspect the patient had an acute infarct around 6 to 12 hours before MRI.  Without a clear time from last known normal, she would not be a candidate for IV TPA.  She  is not a candidate for mechanical thrombectomy due to poor functional baseline as well as outside  the window.   Patient transferred to [facility] for further work-up.  On examining patient, she is awake but aphasic and not following any commands.  She appears to be plegic on the right side.  She has failed a swallow evaluation.  While I do agree with pursuing a stroke work-up-including CT angiogram and echocardiogram.  We can resume aspirin if hemoglobin remains stable, last hemoglobin was 7.1.   I think it would be reasonable to have a discussion with the family regarding goals of care.  Patient does have metastatic cancer and now has a large L MCA stroke resulting in aphasia and right hemiparesis.  Recommendations As stated above regarding stroke work-up if family still wants aggressive treatment. Palliative care consult/goals of care discussion Correct hyponatremia, as this can increase risk of malignant cerebral edema       Silverio Hagan MD [facility] [phone]   If 7pm to 7am, please call on call as listed on AMION.

## 2018-12-30 NOTE — Progress Notes (Signed)
Report called to nurse, Laverda Sorenson on [address] at [facility].  [facility] called and transported patient to room 3W23C at [facility].  [facility] notified that [facility] transferred to [facility].  MD aware.  Family notified.  Virginia Rochester, RN

## 2018-12-31 ENCOUNTER — Inpatient Hospital Stay (HOSPITAL_COMMUNITY): Payer: Medicare HMO

## 2018-12-31 ENCOUNTER — Telehealth: Payer: Self-pay | Admitting: Family Medicine

## 2018-12-31 DIAGNOSIS — I639 Cerebral infarction, unspecified: Secondary | ICD-10-CM

## 2018-12-31 LAB — CBC WITH DIFFERENTIAL/PLATELET
Abs Immature Granulocytes: 0.1 10*3/uL — ABNORMAL HIGH (ref 0.00–0.07)
Band Neutrophils: 35 %
Basophils Absolute: 0 10*3/uL (ref 0.0–0.1)
Basophils Relative: 0 %
Eosinophils Absolute: 0.1 10*3/uL (ref 0.0–0.5)
Eosinophils Relative: 1 %
HCT: 26 % — ABNORMAL LOW (ref 36.0–46.0)
Hemoglobin: 8.5 g/dL — ABNORMAL LOW (ref 12.0–15.0)
Lymphocytes Relative: 10 %
Lymphs Abs: 0.9 10*3/uL (ref 0.7–4.0)
MCH: 30.2 pg (ref 26.0–34.0)
MCHC: 32.7 g/dL (ref 30.0–36.0)
MCV: 92.5 fL (ref 80.0–100.0)
Metamyelocytes Relative: 1 %
Monocytes Absolute: 0.7 10*3/uL (ref 0.1–1.0)
Monocytes Relative: 8 %
Neutro Abs: 6.9 10*3/uL (ref 1.7–7.7)
Neutrophils Relative %: 45 %
Platelets: 298 10*3/uL (ref 150–400)
RBC: 2.81 MIL/uL — ABNORMAL LOW (ref 3.87–5.11)
RDW: 18.3 % — ABNORMAL HIGH (ref 11.5–15.5)
WBC: 8.6 10*3/uL (ref 4.0–10.5)
nRBC: 0.5 % — ABNORMAL HIGH (ref 0.0–0.2)

## 2018-12-31 LAB — GLUCOSE, CAPILLARY
Glucose-Capillary: 68 mg/dL — ABNORMAL LOW (ref 70–99)
Glucose-Capillary: 120 mg/dL — ABNORMAL HIGH (ref 70–99)
Glucose-Capillary: 98 mg/dL (ref 70–99)
Glucose-Capillary: 101 mg/dL — ABNORMAL HIGH (ref 70–99)
Glucose-Capillary: 77 mg/dL (ref 70–99)

## 2018-12-31 LAB — BASIC METABOLIC PANEL
Anion gap: 10 (ref 5–15)
BUN: 5 mg/dL — ABNORMAL LOW (ref 8–23)
CO2: 23 mmol/L (ref 22–32)
Calcium: 8.3 mg/dL — ABNORMAL LOW (ref 8.9–10.3)
Chloride: 99 mmol/L (ref 98–111)
Creatinine, Ser: 0.56 mg/dL (ref 0.44–1.00)
GFR calc Af Amer: >60 mL/min (ref >60)
GFR calc non Af Amer: >60 mL/min (ref >60)
Glucose, Bld: 71 mg/dL (ref 70–99)
Potassium: 3.6 mmol/L (ref 3.5–5.1)
Sodium: 132 mmol/L — ABNORMAL LOW (ref 135–145)

## 2018-12-31 LAB — TYPE AND SCREEN
ABO/RH(D): O POS
Antibody Screen: POSITIVE
DAT, IgG: POSITIVE

## 2018-12-31 LAB — LIPID PANEL
Cholesterol: 121 mg/dL (ref 0–200)
HDL: 31 mg/dL — ABNORMAL LOW (ref >40)
LDL Cholesterol: 75 mg/dL (ref 0–99)
Total CHOL/HDL Ratio: 3.9 RATIO
Triglycerides: 75 mg/dL (ref <150)
VLDL: 15 mg/dL (ref 0–40)

## 2018-12-31 LAB — PATHOLOGIST SMEAR REVIEW

## 2018-12-31 LAB — HEMOGLOBIN A1C
Hgb A1c MFr Bld: 5.3 % (ref 4.8–5.6)
Mean Plasma Glucose: 105.41 mg/dL

## 2018-12-31 MED ORDER — DEXTROSE 50 % IV SOLN
25.0000 g | INTRAVENOUS | Status: AC
  Administered 2018-12-31: 07:00:00 25 g via INTRAVENOUS

## 2018-12-31 MED ORDER — DEXTROSE 50 % IV SOLN
INTRAVENOUS | Status: AC
  Filled 2018-12-31: qty 50

## 2018-12-31 MED ORDER — SODIUM CHLORIDE 0.9 % IV SOLN
2.0000 g | Freq: Three times a day (TID) | INTRAVENOUS | Status: DC
  Administered 2018-12-31 – 2019-01-03 (×10): 2 g via INTRAVENOUS
  Filled 2018-12-31 (×10): qty 2

## 2018-12-31 NOTE — Progress Notes (Signed)
Katie Myers  PROGRESS NOTE    Katie Myers  NWG:[id number] DOB: [birth year] DOA: 12/29/2018 PCP: [name], MD   Brief Narrative:   Patient is a 73 year old female with history of diabetes type 2, cervical cancer with mets to lungs and lymph node, ischemic stroke, chronic anemia, hypertension who was brought to the emergency department yesterday with complaints of shortness of breath,fever.  She was being admitted for pneumonia and while in the emergency department she  became confused, developed slurred speech and right-sided weakness.  MRI of the brain showed acute cortical infarction of anterior left middle cerebral artery territory. She has been started on IV antibiotics for pneumonia.  Neurology has been consulted and she has been transferred to [facility] today.  She is also being followed by oncology.   Assessment & Plan:   Active Problems:   Cervical cancer ([place])   HCAP (healthcare-associated pneumonia)   Pressure injury of skin   Acute ischemic stroke ([name])   1. Acute ischemic stroke:      - MRI: Acute cortical infarct of the anterior left middle cerebral artery territory. No hemorrhage or mass effect.      - Patient is dysarthric, has slurred speech.  She is alert.  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     - Neurology consulted; appreciate assistance.     - Spoke with neurology; rec comfort care and no further stroke w/u  2. Neutropenic fever:      - Febrile on presentation.       - Suspected secondary to pneumonia.  Follow-up cultures.  Afebrile this morning.       - Oncology following  3. Possible healthcare associated pneumonia:      - Immunocompromised patient.       - Chest x-ray finding consistent with infiltrates.       - Continue current antibiotics.       - Covid-19 negative.  4. Hypertension:      - Antihypertensives on hold.       - Allow permissive hypertension.       - Gradually normalize blood pressure in 5 to 7 days.  Use PRN meds for  hypertensive urgency.  5. Hyponatremia:      - Mild. Monitor BMP  6. History of metastatic cervical cancer     - Cervical cancer with mets to lymph nodes and lung.       - Dr. Alvy Bimler is following.  7. Type 2 diabetes mellitus     - Continue sliding scale insulin.       - On metformin at home.  8. Hyperlipidemia     - On Lipitor at home.  9. Hypokalemia     - replete as necessary  10. Chronic normocytic anemia     - Most likely secondary to malignancy, chemotherapy.      - s/p 1 unit of pRBCs as per oncology.   DVT prophylaxis: Lovenox Code Status: FULL Family Communication: None   Disposition Plan: TBD   Consultants:   Oncology  Neurology  Procedures:   None  Antimicrobials:   Cefepime 2g IV q8h    Subjective: Unable to obtain d/t somnolence  Objective: Vitals:   12/31/18 0244 12/31/18 0408 12/31/18 0742 12/31/18 1144  BP: (!) 167/69 (!) 144/60 (!) 160/71 (!) 147/63  Pulse: 89 79 90 89  Resp: 18 18 18 18   Temp: 98.2 F (36.8 C) 98.7 F (37.1 C) 99.8 F (37.7 C) 98.6 F (37  C)  TempSrc: Oral Oral Oral Oral  SpO2: 100% 100% 99% 99%  Weight:      Height:        Intake/Output Summary (Last 24 hours) at 12/31/2018 1518 Last data filed at 12/31/2018 2330 Gross per 24 hour  Intake 1225.8 ml  Output 430 ml  Net 795.8 ml   Filed Weights   12/29/18 1331  Weight: 62.6 kg    Examination:  General: 73 y.o. female resting in bed in NAD Cardiovascular: RRR, +S1, S2, no m/g/r, equal pulses throughout Respiratory: Upper airway transmission,soft rhonchi noted b/l, normal WOB GI: BS+, NDNT, no masses noted, no organomegaly noted MSK: No e/c/c Neuro: somnolent   Data Reviewed: I have personally reviewed following labs and imaging studies.  CBC: Recent Labs  Lab 12/29/18 1336 12/30/18 0639 12/31/18 0436  WBC 0.6* 2.7* 8.6  NEUTROABS 0.4* 0.6* 6.9  HGB 8.9* 7.1* 8.5*  HCT 27.7* 22.2* 26.0*  MCV 95.8 96.9 92.5  PLT 281 258 076    Basic Metabolic Panel: Recent Labs  Lab 12/29/18 1336 12/30/18 0639 12/31/18 0436  NA 129* 129* 132*  K 3.8 3.4* 3.6  CL 93* 98 99  CO2 25 26 23   GLUCOSE 188* 87 71  BUN 6* 8 5*  CREATININE 0.60 0.46 0.56  CALCIUM 8.7* 7.9* 8.3*   GFR: Estimated Creatinine Clearance: 56.4 mL/min (by C-G formula based on SCr of 0.56 mg/dL). Liver Function Tests: Recent Labs  Lab 12/29/18 1336 12/30/18 0639  AST 30 23  ALT 20 16  ALKPHOS 85 61  BILITOT 1.2 1.1  PROT 8.2* 6.5  ALBUMIN 3.0* 2.4*   No results for input(s): LIPASE, AMYLASE in the last 168 hours. No results for input(s): AMMONIA in the last 168 hours. Coagulation Profile: No results for input(s): INR, PROTIME in the last 168 hours. Cardiac Enzymes: No results for input(s): CKTOTAL, CKMB, CKMBINDEX, TROPONINI in the last 168 hours. BNP (last 3 results) No results for input(s): PROBNP in the last 8760 hours. HbA1C: Recent Labs    12/30/18 0639 12/31/18 0436  HGBA1C 5.3 5.3   CBG: Recent Labs  Lab 12/30/18 1757 12/30/18 2113 12/31/18 0615 12/31/18 0740 12/31/18 1151  GLUCAP 88 74 68* 120* 98   Lipid Profile: Recent Labs    12/31/18 0436  CHOL 121  HDL 31*  LDLCALC 75  TRIG 75  CHOLHDL 3.9   Thyroid Function Tests: No results for input(s): TSH, T4TOTAL, FREET4, T3FREE, THYROIDAB in the last 72 hours. Anemia Panel: No results for input(s): VITAMINB12, FOLATE, FERRITIN, TIBC, IRON, RETICCTPCT in the last 72 hours. Sepsis Labs: Recent Labs  Lab 12/29/18 1336  LATICACIDVEN 1.7    Recent Results (from the past 240 hour(s))  Blood Culture (routine x 2)     Status: None (Preliminary result)   Collection Time: 12/29/18  1:36 PM  Result Value Ref Range Status   Specimen Description   Final    BLOOD LEFT ANTECUBITAL Performed at [facility], [place] [address] [place], [place] [address]    Special Requests   Final    BOTTLES DRAWN AEROBIC AND ANAEROBIC Blood Culture adequate volume Performed at  [place] [address], [place], [place] [address]    Culture   Final    NO GROWTH 2 DAYS Performed at [place] [address], [place], [place] [address]    Report Status PENDING  Incomplete  Blood Culture (routine x 2)     Status: None (  Preliminary result)   Collection Time: 12/29/18  1:52 PM  Result Value Ref Range Status   Specimen Description   Final    BLOOD RIGHT ANTECUBITAL Performed at [facility], [place] [address], [place], [place] [address]    Special Requests   Final    BOTTLES DRAWN AEROBIC AND ANAEROBIC Blood Culture results may not be optimal due to an excessive volume of blood received in culture bottles Performed at [place] [address], [place], [place] [address]    Culture   Final    NO GROWTH 2 DAYS Performed at [place] [address] [place], [place] [address]    Report Status PENDING  Incomplete  SARS Coronavirus 2 (CEPHEID- Performed in [place] hospital lab), Hosp Order     Status: None   Collection Time: 12/29/18  2:40 PM  Result Value Ref Range Status   SARS Coronavirus 2 NEGATIVE NEGATIVE Final    Comment: (NOTE) If result is NEGATIVE SARS-CoV-2 target nucleic acids are NOT DETECTED. The SARS-CoV-2 RNA is generally detectable in upper and lower  respiratory specimens during the acute phase of infection. The lowest  concentration of SARS-CoV-2 viral copies this assay can detect is 250  copies / mL. A negative result does not preclude SARS-CoV-2 infection  and should not be used as the sole basis for treatment or other  patient management decisions.  A negative result may occur with  improper specimen collection / handling, submission of specimen other  than nasopharyngeal swab, presence of viral mutation(s) within the  areas targeted by this assay, and inadequate number of viral copies  (<250 copies / mL). A negative result must be combined with clinical  observations,  patient history, and epidemiological information. If result is POSITIVE SARS-CoV-2 target nucleic acids are DETECTED. The SARS-CoV-2 RNA is generally detectable in upper and lower  respiratory specimens dur ing the acute phase of infection.  Positive  results are indicative of active infection with SARS-CoV-2.  Clinical  correlation with patient history and other diagnostic information is  necessary to determine patient infection status.  Positive results do  not rule out bacterial infection or co-infection with other viruses. If result is PRESUMPTIVE POSTIVE SARS-CoV-2 nucleic acids MAY BE PRESENT.   A presumptive positive result was obtained on the submitted specimen  and confirmed on repeat testing.  While 2019 novel coronavirus  (SARS-CoV-2) nucleic acids may be present in the submitted sample  additional confirmatory testing may be necessary for epidemiological  and / or clinical management purposes  to differentiate between  SARS-CoV-2 and other Sarbecovirus currently known to infect humans.  If clinically indicated additional testing with an alternate test  methodology [phone]) is advised. The SARS-CoV-2 RNA is generally  detectable in upper and lower respiratory sp ecimens during the acute  phase of infection. The expected result is Negative. Fact Sheet for Patients:  StrictlyIdeas.no Fact Sheet for Healthcare Providers: BankingDealers.co.za This test is not yet approved or cleared by the [place] FDA and has been authorized for detection and/or diagnosis of SARS-CoV-2 by FDA under an Emergency Use Authorization (EUA).  This EUA will remain in effect (meaning this test can be used) for the duration of the COVID-19 declaration under Section 564(b)(1) of the Act, 21 U.S.C. section 360bbb-3(b)(1), unless the authorization is terminated or revoked sooner. Performed at [facility], [place] [address] [place], [place] [address]   MRSA PCR Screening     Status:  None   Collection Time: 12/29/18  5:23 PM  Result Value Ref Range Status   MRSA by PCR NEGATIVE NEGATIVE Final    Comment:        The GeneXpert MRSA Assay (FDA approved for NASAL specimens only), is one component of a comprehensive MRSA colonization surveillance program. It is not intended to diagnose MRSA infection nor to guide or monitor treatment for MRSA infections. Performed at [facility], [place] [address] [place], [place] [address]          Radiology Studies: Ct Head Wo Contrast  Result Date: 12/29/2018 CLINICAL DATA:  Altered level of consciousness. History of cervical cancer. EXAM: CT HEAD WITHOUT CONTRAST TECHNIQUE: Contiguous axial images were obtained from the base of the skull through the vertex without intravenous contrast. COMPARISON:  None. FINDINGS: Brain: Mild atrophy. Patchy white matter hypodensity bilaterally appears chronic. No acute infarct, hemorrhage, or mass lesion. Vascular: Atherosclerotic calcification in the carotid and vertebral arteries. Skull: Negative Sinuses/Orbits: Negative Other: None IMPRESSION: No acute abnormality. Mild atrophy and mild chronic microvascular ischemic type changes in the white matter. Electronically Signed   By: Franchot Gallo M.D.   On: 12/29/2018 16:37   Mr Jeri Cos VZ Contrast  Result Date: 12/29/2018 CLINICAL DATA:  Ataxia EXAM: MRI HEAD WITHOUT AND WITH CONTRAST TECHNIQUE: Multiplanar, multiecho pulse sequences of the brain and surrounding structures were obtained without and with intravenous contrast. CONTRAST:  6 mL Gadavist COMPARISON:  Head CT 12/29/2018 FINDINGS: Examination is severely degraded by motion. BRAIN: Abnormal diffusion restriction along the cortex of the left anterior MCA territory. The midline structures are normal. No midline shift or other mass effect. Multifocal white matter hyperintensity, most commonly due to chronic  ischemic microangiopathy. Generalized atrophy without lobar predilection. Susceptibility-sensitive sequences show no chronic microhemorrhage or superficial siderosis. No abnormal contrast enhancement. VASCULAR: The major intracranial arterial and venous sinus flow voids are normal. SKULL AND UPPER CERVICAL SPINE: Calvarial bone marrow signal is normal. There is no skull base mass. Visualized upper cervical spine and soft tissues are normal. SINUSES/ORBITS: No fluid levels or advanced mucosal thickening. No mastoid or middle ear effusion. The orbits are normal. IMPRESSION: 1. 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 No hemorrhage or mass effect. 2. Chronic ischemic microvascular disease. 3. Severely motion degraded study. Electronically Signed   By: Ulyses Jarred M.D.   On: 12/29/2018 19:42   Dg Swallowing Func-speech Pathology  Result Date: 12/31/2018 Objective Swallowing Evaluation: Type of Study: MBS-Modified Barium Swallow Study  Patient Details Name: TERRISA CURFMAN MRN: [id number] Date of Birth: 1945-09-26 Today's Date: 12/31/2018 Time: SLP Start Time (ACUTE ONLY): 1300 -SLP Stop Time (ACUTE ONLY): 1420 SLP Time Calculation (min) (ACUTE ONLY): 80 min Past Medical History: Past Medical History: Diagnosis Date  Cancer ([place])   cervical cancer  Diabetes mellitus without complication ([place])   Hyperlipidemia   Hypertension   Knee pain, bilateral   Neuropathy   Stroke ([place])   Underweight  Past Surgical History: Past Surgical History: Procedure Laterality Date  IR IMAGING GUIDED PORT INSERTION  11/05/2018 HPI: 73 year old female presented hospital with shortness of breath and inability to express herself.  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  She usually is  able to talk and converse. There  are no prior SLP notes in chart  No data recorded Assessment / Plan / Recommendation CHL IP CLINICAL IMPRESSIONS 12/31/2018 Clinical Impression Pt demonstrates a mild to moderate oral dysphagia due to missing dentition and right CN VII weakenss and sensory deficit. There is prolonged mastication with oral residual. There is intermittent anterior spillage with most bites and sips. Occasional premature spillage also occurs, but no penetration or aspiration was observed. Will initiate a dysphagia 1 (puree) and thin liquid diet With postential to advance solids if tolerated at bedside without too much struggle or pocketing.  SLP Visit Diagnosis Dysphagia, oral phase (R13.11) Attention and concentration deficit following -- Frontal lobe and executive function deficit following -- Impact on safety and function --   CHL IP TREATMENT RECOMMENDATION 12/31/2018 Treatment Recommendations Defer until completion of intrumental exam   No flowsheet data found. CHL IP DIET RECOMMENDATION 12/31/2018 SLP Diet Recommendations Dysphagia 1 (Puree) solids;Thin liquid Liquid Administration via Cup;Straw Medication Administration Whole meds with puree Compensations Minimize environmental distractions;Monitor for anterior loss;Lingual sweep for clearance of pocketing;Follow solids with liquid Postural Changes Remain semi-upright after after feeds/meals (Comment)   CHL IP OTHER RECOMMENDATIONS 12/31/2018 Recommended Consults -- Oral Care Recommendations Oral care BID Other Recommendations --   CHL IP FOLLOW UP RECOMMENDATIONS 12/31/2018 Follow up Recommendations 24 hour supervision/assistance   CHL IP FREQUENCY AND DURATION 12/31/2018 Speech Therapy Frequency (ACUTE ONLY) min 2x/week Treatment Duration --      CHL IP ORAL PHASE 12/31/2018 Oral Phase Impaired Oral - Pudding Teaspoon -- Oral - Pudding Cup -- Oral - Honey Teaspoon -- Oral - Honey Cup -- Oral - Nectar Teaspoon -- Oral - Nectar Cup Right anterior bolus  loss;Right pocketing in lateral sulci Oral - Nectar Straw Right anterior bolus loss Oral - Thin Teaspoon -- Oral - Thin Cup Right anterior bolus loss Oral - Thin Straw Right anterior bolus loss Oral - Puree Right pocketing in lateral sulci Oral - Mech Soft Right pocketing in lateral sulci;Right anterior bolus loss Oral - Regular -- Oral - Multi-Consistency -- Oral - Pill -- Oral Phase - Comment --  CHL IP PHARYNGEAL PHASE 12/31/2018 Pharyngeal Phase WFL Pharyngeal- Pudding Teaspoon -- Pharyngeal -- Pharyngeal- Pudding Cup -- Pharyngeal -- Pharyngeal- Honey Teaspoon -- Pharyngeal -- Pharyngeal- Honey Cup -- Pharyngeal -- Pharyngeal- Nectar Teaspoon -- Pharyngeal -- Pharyngeal- Nectar Cup -- Pharyngeal -- Pharyngeal- Nectar Straw -- Pharyngeal -- Pharyngeal- Thin Teaspoon -- Pharyngeal -- Pharyngeal- Thin Cup -- Pharyngeal -- Pharyngeal- Thin Straw -- Pharyngeal -- Pharyngeal- Puree -- Pharyngeal -- Pharyngeal- Mechanical Soft -- Pharyngeal -- Pharyngeal- Regular -- Pharyngeal -- Pharyngeal- Multi-consistency -- Pharyngeal -- Pharyngeal- Pill -- Pharyngeal -- Pharyngeal Comment --  No flowsheet data found. Herbie Baltimore, MA CCC-SLP Acute Rehabilitation Services Pager [phone] Office [phone] Lynann Beaver 12/31/2018, 2:08 PM              Vas US Carotid  Result Date: 12/30/2018 Carotid Arterial Duplex Study Indications: TIA. Performing Technologist: Oliver Hum RVT  Examination Guidelines: A complete evaluation includes B-mode imaging, spectral Doppler, color Doppler, and power Doppler as needed of all accessible portions of each vessel. Bilateral testing is considered an integral part of a complete examination. Limited examinations for reoccurring indications may be performed as noted.  Right Carotid Findings: +----------+--------+--------+--------+-----------------------+--------+             PSV cm/s EDV cm/s Stenosis Describe                Comments   +----------+--------+--------+--------+-----------------------+--------+  CCA Prox   62       8                 smooth and heterogenous           +----------+--------+--------+--------+-----------------------+--------+  CCA Distal 68       16                smooth and heterogenous           +----------+--------+--------+--------+-----------------------+--------+  ICA Prox   122      47                smooth and heterogenous tortuous  +----------+--------+--------+--------+-----------------------+--------+  ICA Distal 62       21                                        tortuous  +----------+--------+--------+--------+-----------------------+--------+  ECA        115      9                                                   +----------+--------+--------+--------+-----------------------+--------+ +----------+--------+-------+--------+-------------------+             PSV cm/s EDV cms Describe Arm Pressure (mmHG)  +----------+--------+-------+--------+-------------------+  Subclavian 134                                            +----------+--------+-------+--------+-------------------+ +---------+--------+--+--------+--+---------+  Vertebral PSV cm/s 60 EDV cm/s 17 Antegrade  +---------+--------+--+--------+--+---------+  Left Carotid Findings: +----------+--------+-------+--------+--------------------------------+--------+             PSV cm/s EDV     Stenosis Describe                         Comments                       cm/s                                                        +----------+--------+-------+--------+--------------------------------+--------+  CCA Prox   80       13               smooth and heterogenous                    +----------+--------+-------+--------+--------------------------------+--------+  CCA Distal 48       10               smooth and heterogenous                    +----------+--------+-------+--------+--------------------------------+--------+  ICA Prox   74       13                smooth, heterogenous and  calcific                                   +----------+--------+-------+--------+--------------------------------+--------+  ICA Distal 41       16                                                tortuous  +----------+--------+-------+--------+--------------------------------+--------+  ECA        179      20                                                          +----------+--------+-------+--------+--------------------------------+--------+ +----------+--------+--------+--------+-------------------+  Subclavian PSV cm/s EDV cm/s Describe Arm Pressure (mmHG)  +----------+--------+--------+--------+-------------------+             158                                             +----------+--------+--------+--------+-------------------+ +---------+--------+--+--------+--+---------+  Vertebral PSV cm/s 60 EDV cm/s 22 Antegrade  +---------+--------+--+--------+--+---------+  Summary: Right Carotid: Velocities in the right ICA are consistent with a 40-59%                stenosis. Left Carotid: Velocities in the left ICA are consistent with a 1-39% stenosis. Vertebrals: Bilateral vertebral arteries demonstrate antegrade flow. *See table(s) above for measurements and observations.  Electronically signed by Antony Contras MD on 12/30/2018 at 12:25:45 PM.    Final         Scheduled Meds:  sodium chloride   Intravenous Once   acetaminophen  650 mg Oral Once   atorvastatin  40 mg Oral Daily   clopidogrel  75 mg Oral Daily   diphenhydrAMINE  25 mg Intravenous Once   enoxaparin (LOVENOX) injection  40 mg Subcutaneous Q24H   insulin aspart  0-5 Units Subcutaneous QHS   insulin aspart  0-9 Units Subcutaneous TID WC   sodium chloride flush  10-40 mL Intracatheter Q12H   Continuous Infusions:  sodium chloride 75 mL/hr at 12/30/18 0909   ceFEPime (MAXIPIME) IV 2 g (12/31/18 0916)     LOS: 2 days    Time spent: 25  minutes spent in the coordination of care of this patient today.    Jonnie Finner, DO [facility] Pager [phone]  If 7PM-7AM, please contact night-coverage [email or web address] Password [facility] 12/31/2018, 3:18 PM

## 2018-12-31 NOTE — Progress Notes (Signed)
  Speech Language Pathology Treatment: Cognitive-Linquistic(Aphasia)  Patient Details Name: Katie Myers MRN: [id number] DOB: Jul 15, 1946 Today's Date: 12/31/2018 Time: 5809-9833 SLP Time Calculation (min) (ACUTE ONLY): 10 min  Assessment / Plan / Recommendation Clinical Impression  Pt was seen for aphasia treatment and was cooperative throughout the session. She was unable to complete automatic sequences despite max verbal and visual cues. Increased vocalizations were noted during confrontational naming task but no intelligible speech was demonstrated. She achieved 20% accuracy with phrase completion when max cues were provided. However, her production was an approximation of the target word and intelligibilty was reduced. She demonstrated 0% accuracy with 2-step commands despite prompts and cues but responded to complex yes/no questions with 70% accuracy. SLP will continue to follow.    HPI HPI: 73 year old female presented hospital with shortness of breath and inability to express herself.  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  She usually is able to talk and converse. There are no prior SLP notes in chart      SLP Plan  Continue with current plan of care  Patient needs continued Speech Lanaguage Pathology Services    Recommendations  Medication Administration: Crushed with puree                Follow up Recommendations: Inpatient Rehab SLP Visit Diagnosis: Aphasia (R47.01) Plan: Continue with current plan of care       Kazumi Lachney I. Hardin Negus, [place], [place] Office number [phone] Pager [place] 12/31/2018, 11:10 AM

## 2018-12-31 NOTE — Progress Notes (Signed)
PHARMACY NOTE:  ANTIMICROBIAL RENAL DOSAGE ADJUSTMENT  Current antimicrobial regimen includes a mismatch between antimicrobial dosage and estimated renal function.  As per policy approved by the Pharmacy & Therapeutics and Medical Executive Committees, the antimicrobial dosage will be adjusted accordingly.  Current antimicrobial dosage:  Cefepime 2gm IV Q12H  Indication: PNA + neutropenic fever  Renal Function:  Estimated Creatinine Clearance: 56.4 mL/min (by C-G formula based on SCr of 0.56 mg/dL). []      On intermittent HD, scheduled: []      On CRRT    Antimicrobial dosage has been changed to:  Cefepime 2gm IV Q8H  Birgit Nowling D. Mina Marble, PharmD, BCPS, [place] 12/31/2018, 8:08 AM

## 2018-12-31 NOTE — Evaluation (Signed)
Clinical/Bedside Swallow Evaluation Patient Details  Name: Katie Myers MRN: [id number] Date of Birth: Mar 15, 1946  Today's Date: 12/31/2018 Time: SLP Start Time (ACUTE ONLY): 94 SLP Stop Time (ACUTE ONLY): 1017 SLP Time Calculation (min) (ACUTE ONLY): 11 min  Past Medical History:  Past Medical History:  Diagnosis Date  . Cancer (HCC)    cervical cancer  . Diabetes mellitus without complication (Katie Myers)   . Hyperlipidemia   . Hypertension   . Knee pain, bilateral   . Neuropathy   . Stroke ([place])   . Underweight    Past Surgical History:  Past Surgical History:  Procedure Laterality Date  . IR IMAGING GUIDED PORT INSERTION  11/05/2018   HPI:  73 year old female presented hospital with shortness of breath and inability to express her self.  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  She usually is able to talk and converse. There are no prior SLP notes in chart   Assessment / Plan / Recommendation Clinical Impression  Pt demonstrates signs of oral dysphagia and concern for pharyngeal impairment as well. She is alert, able to self feed, but unaware of anterior spill due to right CN VII weakness, cannot follow verbal commands. There is an observable swallow response, but also immediate cough responses. Pts respiration is severely congested. Will proceed with objective swallow assessment to determine best diet textures and strategies if any. Also wrote order for Speech language eval given new CVA and obvious aphasia.   SLP Visit Diagnosis: Dysphagia, oropharyngeal phase (R13.12)    Aspiration Risk  Moderate aspiration risk    Diet Recommendation NPO;NPO except meds   Medication Administration: Crushed with puree    Other  Recommendations     Follow up Recommendations  Inpatient Rehab      Frequency and Duration            Prognosis        Swallow Study   General HPI: 73 year old female presented hospital with shortness of breath and inability to express her self.  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  She usually is able to talk and converse. There are no prior SLP notes in chart Type of Study: Bedside Swallow Evaluation Diet Prior to this Study: NPO Temperature Spikes Noted: No Respiratory Status: Room air History of Recent Intubation: No Behavior/Cognition: Alert;Cooperative;Pleasant mood;Doesn't follow directions Oral Cavity Assessment: Within Functional Limits Oral Care Completed by SLP: No Oral Cavity - Dentition: Poor condition Vision: Functional for self-feeding Self-Feeding Abilities: Able to feed self Patient Positioning: Upright in bed Baseline Vocal Quality: Normal Volitional Cough: Congested Volitional Swallow: Unable to elicit    Oral/Motor/Sensory Function Overall Oral Motor/Sensory Function: Moderate impairment Facial ROM: Reduced right Facial Symmetry: Abnormal symmetry right Facial Strength: Reduced right Facial Sensation: Reduced right Lingual ROM: Other (Comment)(Pt would not protrude tongue to examine, suspect WNL)   Ice Chips Ice chips: Within functional limits   Thin Liquid Thin Liquid: Impaired Presentation: Cup;Straw Oral Phase Impairments: Reduced labial seal Oral Phase Functional Implications: Right anterior spillage;Right lateral sulci pocketing Pharyngeal  Phase Impairments: Cough - Immediate  Nectar Thick Nectar Thick Liquid: Impaired Presentation: Straw Oral Phase Impairments: Reduced labial seal Oral phase functional implications: Right lateral sulci pocketing   Honey Thick Honey  Thick Liquid: Not tested   Puree Puree: Impaired Oral Phase Impairments: Reduced labial seal   Solid     Solid: Not tested     Herbie Baltimore, MA CCC-SLP  Acute Rehabilitation Services Pager [phone] Office [phone]  Lynann Beaver 12/31/2018,10:24 AM

## 2018-12-31 NOTE — Progress Notes (Addendum)
  Rehab Admissions Coordinator Note:  Patient was screened by Cleatrice Burke for appropriateness for an Inpatient Acute Rehab Consult per PT recommendations.  At this time, we are recommending Inpatient Rehab consult.  Cleatrice Burke RN MSN 12/31/2018, 1:37 PM  I can be reached at [phone].  Noted Neuro and oncology recommending palliative/hospice due to advanced cancer. Therefore, will recommend Hospice, not intensive inpt rehab at this time.,

## 2018-12-31 NOTE — Evaluation (Signed)
Speech Language Pathology Evaluation Patient Details Name: Katie Myers MRN: [id number] DOB: 11/23/1945 Today's Date: 12/31/2018 Time: 2774-1287 SLP Time Calculation (min) (ACUTE ONLY): 12 min  Problem List:  Patient Active Problem List   Diagnosis Date Noted  . Pressure injury of skin 12/30/2018  . Acute ischemic stroke ([place]) 12/30/2018  . HCAP (healthcare-associated pneumonia) 12/29/2018  . Neutropenic fever ([place])   . Chronic pain of right knee 12/18/2018  . Controlled type 2 diabetes mellitus without complication, without long-term current use of insulin ([place]) 12/10/2018  . Osteoarthritis of both knees 11/20/2018  . 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 Other constipation 11/17/2018  . Other proteinuria 11/03/2018  . Physical debility 10/27/2018  . Goals of care, counseling/discussion 10/27/2018  . Essential hypertension 10/27/2018  . Malignant cachexia ([place]) 10/27/2018  . History of stroke 10/27/2018  . Cervical cancer ([place]) 10/23/2018  . Metastasis to lung ([place]) 10/23/2018  . Metastasis to lymph nodes ([place]) 10/23/2018  . Iron deficiency anemia due to chronic blood loss 10/23/2018   Past Medical History:  Past Medical History:  Diagnosis Date  . Cancer (HCC)    cervical cancer  . Diabetes mellitus without complication ([place])   . Hyperlipidemia   . Hypertension   . Knee pain, bilateral   . Neuropathy   . Stroke ([place])   . Underweight    Past Surgical History:  Past Surgical History:  Procedure Laterality Date  . IR IMAGING GUIDED PORT INSERTION  11/05/2018   HPI:  73 year old female presented hospital with shortness of breath and inability to express herself.  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  She usually is able to talk and converse. There  are no prior SLP notes in chart   Assessment / Plan / Recommendation Clinical Impression  Pt presents with severe expressive aphasia characterized by deficits in both verbal expression and comprehension with more notable difficulty with verbal expression. Receptively, she was able to accurately respond to simple yes/no questions and inconsistently follow 1-step commands but she exhibited difficulty with questions and commands of increased complexity. With verbal expression, she vocalized responses to yes/no questions but no intelligible speech was noted during the evaluation. Continued skilled SLP services are clinically indicated at this time to improve communication and further assess swallow function.     SLP Assessment  SLP Recommendation/Assessment: Patient needs continued Speech Lanaguage Pathology Services SLP Visit Diagnosis: Aphasia (R47.01)    Follow Up Recommendations  Inpatient Rehab    Frequency and Duration min 2x/week  2 weeks      SLP Evaluation Cognition  Overall Cognitive Status: Difficult to assess(Due to language impairment)       Comprehension  Auditory Comprehension Overall Auditory Comprehension: Impaired Yes/No Questions: Impaired Basic Biographical Questions: (4/5) Complex Questions: (3/5) Commands: Impaired One Step Basic Commands: (3/5) Two Step Basic Commands: (0/5) Conversation: Simple Visual Recognition/Discrimination Discrimination: Exceptions to [facility] Pictures: Unable to indentify Reading Comprehension Reading Status: Impaired Word level: Impaired    Expression Expression Primary Mode of Expression: Nonverbal - gestures Verbal Expression Overall Verbal Expression: Impaired Initiation: Impaired Automatic Speech: Counting Repetition: Impaired Level of Impairment: Word level Naming: Impairment Responsive: (0/5) Confrontation: (0/10) Pictures: Unable to indentify Convergent: (0/5)   Oral / Motor  Oral Motor/Sensory Function Overall Oral  Motor/Sensory Function: Moderate  impairment Facial ROM: Reduced right Facial Symmetry: Abnormal symmetry right Facial Strength: Reduced right Facial Sensation: Reduced right Lingual ROM: Other (Comment)(Pt would not protrude tongue to examine, suspect WNL) Motor Speech Overall Motor Speech: Other (comment)(Unable to assess due to severity of language impairment. )   Meshach Perry I. Hardin Negus, [place], [address] Office number [phone] Pager [place] 12/31/2018, 11:03 AM

## 2018-12-31 NOTE — Progress Notes (Signed)
TCD       has been completed. Preliminary results can be found under CV proc through chart review. June Leap, BS, RDMS, RVT

## 2018-12-31 NOTE — Progress Notes (Signed)
Modified Barium Swallow Progress Note  Patient Details  Name: DANELI BUTKIEWICZ MRN: [id number] Date of Birth: 03-17-1946  Today's Date: 12/31/2018  Modified Barium Swallow completed.  Full report located under Chart Review in the Imaging Section.  Brief recommendations include the following:  Clinical Impression  Pt demonstrates a mild to moderate oral dyspahgia due to missing dentition and right CN VII weakenss and sensory deficit. There is prolonged mastication with oral residual. There is intermittent anterior spillage with most bites and sips. Occasional premature spillage also occurs, but no penetration or aspiration was observed. Will initiate a dysphagia 1 (puree) and thin liquid diet With postential to advance solids if tolerated at bedside without too much struggle or pocketing.    Swallow Evaluation Recommendations       SLP Diet Recommendations: Dysphagia 1 (Puree) solids;Thin liquid   Liquid Administration via: Cup;Straw   Medication Administration: Whole meds with puree   Supervision: Staff to assist with self feeding   Compensations: Minimize environmental distractions;Monitor for anterior loss;Lingual sweep for clearance of pocketing;Follow solids with liquid   Postural Changes: Remain semi-upright after after feeds/meals (Comment)   Oral Care Recommendations: Oral care BID      Herbie Baltimore, MA CCC-SLP  Acute Rehabilitation Services Pager [phone] Office [phone]   Lynann Beaver 12/31/2018,2:08 PM

## 2018-12-31 NOTE — Evaluation (Signed)
Physical Therapy Evaluation Patient Details Name: Katie Myers MRN: [id number] DOB: 01-05-1946 Today's Date: 12/31/2018   History of Present Illness  Katie Myers is a 73 y.o. female with past medical history of stroke, neuropathy, knee pain, hypertension, mets to the lung and lymph nodes, chronic anemia, hyperlipidemia and diabetes.  Patient is a phasic thus the majority of the information was obtained from the chart.  Patient lives at home with her daughter and was brought to the emergency department secondary to constipation 2 days ago was released then returned due to SOB  Clinical Impression   Pt admitted with above diagnosis. She appears to have Rt hemiparesis and aphasia. Communication is very limited, she has some facial expressions but overall it is difficult to assess strength and abilities and prior history due to not being able to follow commands. Pt currently with functional limitations due to the deficits listed below (see PT Problem List). She was overall max A for transfers today and was not able to ambulate. She will benefit from skilled PT to increase their independence and safety with mobility to allow discharge to the venue listed below.       Follow Up Recommendations CIR;SNF;LTACH    Equipment Recommendations  (PT unsure what equipment she may already have,)    Recommendations for Other Services       Precautions / Restrictions Precautions Precautions: Fall Restrictions Weight Bearing Restrictions: No      Mobility  Bed Mobility Overal bed mobility: Needs Assistance Bed Mobility: Rolling;Supine to Sit;Sit to Supine Rolling: Max assist   Supine to sit: Max assist Sit to supine: Max assist      Transfers Overall transfer level: Needs assistance Equipment used: (one person max assist) Transfers: Sit to/from [facility] Sit to Stand: Max assist Stand pivot transfers: Max assist       General transfer comment: can use her Lt UE/LE  minimally, does not use her Rt side to help any, transfered to chair and then back to bed  Ambulation/Gait Ambulation/Gait assistance: (unable to ambulate today)     Modified Rankin (Stroke Patients Only) Modified Rankin (Stroke Patients Only) Pre-Morbid Rankin Score: Severe disability     Balance Overall balance assessment: Needs assistance   Sitting balance-Leahy Scale: Poor       Standing balance-Leahy Scale: Zero                               Pertinent Vitals/Pain Pain Assessment: No/denies pain    Home Living Family/patient expects to be discharged to:: Private residence Living Arrangements: Children;Other relatives(lives with daughter)               Additional Comments: pt nonverbal to unable to determine    Prior Function           Comments: pt nonverbal to unable to determine     Hand Dominance        Extremity/Trunk Assessment   Upper Extremity Assessment Upper Extremity Assessment: Difficult to assess due to impaired cognition;Generalized weakness;RUE deficits/detail(does not understand commands) RUE Deficits / Details: appears to have Rt hemiparesis LUE Deficits / Details: can use her Lt UE to help minimally with transfers    Lower Extremity Assessment Lower Extremity Assessment: Generalized weakness;RLE deficits/detail;LLE deficits/detail;Difficult to assess due to impaired cognition(does not understand commands) RLE Deficits / Details: appears to have Rt hemiparesis LLE Deficits / Details: Can use her Lt LE minimally for stand pivot  transfer with max A       Communication   Communication: Receptive difficulties;Expressive difficulties(can use some facial expressions but communication is minimal)  Cognition Arousal/Alertness: Awake/alert   Overall Cognitive Status: Difficult to assess(Due to language impairment)               Assessment/Plan    PT Assessment Patient needs continued PT services  PT Problem List  Decreased strength;Decreased range of motion;Decreased activity tolerance;Decreased balance;Decreased mobility;Decreased coordination;Decreased cognition;Decreased knowledge of use of DME;Decreased safety awareness;Cardiopulmonary status limiting activity       PT Treatment Interventions DME instruction;Gait training;Functional mobility training;Stair training;Therapeutic activities;Balance training;Neuromuscular re-education;Therapeutic exercise;Cognitive remediation;Patient/family education;Wheelchair mobility training;Manual techniques;Modalities    PT Goals (Current goals can be found in the Care Plan section)  Acute Rehab PT Goals Patient Stated Goal: did not state PT Goal Formulation: Patient unable to participate in goal setting Time For Goal Achievement: 01/14/19 Potential to Achieve Goals: Fair    Frequency Min 5X/week   Barriers to discharge (pt nonverbal to unable to determine)         AM-PAC PT "6 Clicks" Mobility  Outcome Measure Help needed turning from your back to your side while in a flat bed without using bedrails?: A Lot Help needed moving from lying on your back to sitting on the side of a flat bed without using bedrails?: A Lot Help needed moving to and from a bed to a chair (including a wheelchair)?: Total Help needed standing up from a chair using your arms (e.g., wheelchair or bedside chair)?: Total Help needed to walk in hospital room?: Total Help needed climbing 3-5 steps with a railing? : Total 6 Click Score: 8    End of Session Equipment Utilized During Treatment: Gait belt;Oxygen Activity Tolerance: Patient limited by fatigue   Nurse Communication: Mobility status PT Visit Diagnosis: Unsteadiness on feet (R26.81);Hemiplegia and hemiparesis;Other abnormalities of gait and mobility (R26.89);Muscle weakness (generalized) (M62.81) Hemiplegia - caused by: (Lt MCA infarct)    Time: 6269-4854 PT Time Calculation (min) (ACUTE ONLY): 23 min   Charges:    PT Evaluation $PT Eval High Complexity: 1 High PT Treatments $Therapeutic Activity: 8-22 mins        Elsie Ra, PT,DPT Acute Rehabilitation Services Office [phone]    Debbe Odea 12/31/2018, 11:07 AM

## 2018-12-31 NOTE — Telephone Encounter (Signed)
Called and talked with Katie Myers. Answered all of her questions. Difficult time with Garg[place] in hospital.

## 2018-12-31 NOTE — Progress Notes (Signed)
Katie Myers   DOB:Mar 27, 1946   DT#:[id number]    ASSESSMENT & PLAN:  Acute CVA The patient is transferred to [facility] for further management. 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 She has responded to blood transfusion. Her blood count is stable today She does not need further transfusion support  Neutropenia, resolved Due to recent chemotherapy She has received 1 dose of G-CSF yesterday with improvement of her white count Monitor closely.  She does not need further G-CSF support  Cervical cancer [facility]) The patient was seen by GYN oncologist on 12/25/2018  Recommendation was for a repeat PET scan prior to cycle #4, however, will put everything on hold for now Due to significant neurological deficit, it is unlikely she can benefit for future chemotherapy if she has no further improvement of her neurological deficit. For now, I do not recommend repeat imaging study as it would not help to change management   Bilateral pneumonia, neutropenic fever She is currently on broad-spectrum IV antibiotics COVID 19 test was negative It is not clear whether the pneumonia could be triggered by aspiration.  Goals of care Overall, prognosis is poor She has extensive stage cervical cancer and treatment goal was strictly palliative in nature If she does not recover or retain reasonable function after her acute stroke, she will not be able to proceed with further chemotherapy I have updated her next of kin, Katie Myers, her niece today For now, I recommend aggressive supportive care I will continue to  follow Primary service is updated with the discussion above.   Heath Lark, MD 12/31/2018 1:25 PM  Subjective:  The patient appears to have difficulties with aphasia/dysphasia.  She is not able to follow commands today.  All the questions are answered with, "yes".  She appears comfortable and not in pain I spoke with her caregiver, Katie Myers over the telephone.  She has the impression that the patient looks better today over face time. Assessment by speech and language therapist and physical therapist are noted  Objective:  Vitals:   12/31/18 0742 12/31/18 1144  BP: (!) 160/71 (!) 147/63  Pulse: 90 89  Resp: 18 18  Temp: 99.8 F (37.7 C) 98.6 F (37 C)  SpO2: 99% 99%     Intake/Output Summary (Last 24 hours) at 12/31/2018 1325 Last data filed at 12/31/2018 9983 Gross per 24 hour  Intake 1225.8 ml  Output 490 ml  Net 735.8 ml    GENERAL:alert, no distress and comfortable NEURO: alert but not oriented with limited understanding, no dysarthria, right facial dropping is noted with right sided deficit    Labs:  Recent Labs    12/18/18 1118 12/29/18 1336 12/30/18 0639 12/31/18 0436  NA 136 129* 129* 132*  K 3.8 3.8 3.4* 3.6  CL 98 93* 98 99  CO2 27 25 26 23   GLUCOSE 194* 188* 87 71  BUN 9 6* 8 5*  CREATININE 0.70 0.60 0.46 0.56  CALCIUM 8.7* 8.7* 7.9* 8.3*  GFRNONAA >60 >60 >60 >60  GFRAA >60 >60 >60 >60  PROT 8.2* 8.2* 6.5  --   ALBUMIN 2.9* 3.0* 2.4*  --   AST 31 30 23   --  ALT 17 20 16   --   ALKPHOS 89 85 61  --   BILITOT 0.4 1.2 1.1  --     Studies:  Ct Head Wo Contrast  Result Date: 12/29/2018 CLINICAL DATA:  Altered level of consciousness. History of cervical cancer. EXAM: CT HEAD WITHOUT CONTRAST TECHNIQUE: Contiguous axial images were obtained from the base of the skull through the vertex without intravenous contrast. COMPARISON:  None. FINDINGS: Brain: Mild atrophy. Patchy white matter hypodensity bilaterally appears chronic. No acute infarct, hemorrhage, or  mass lesion. Vascular: Atherosclerotic calcification in the carotid and vertebral arteries. Skull: Negative Sinuses/Orbits: Negative Other: None IMPRESSION: No acute abnormality. Mild atrophy and mild chronic microvascular ischemic type changes in the white matter. Electronically Signed   By: Franchot Gallo M.D.   On: 12/29/2018 16:37   Mr Jeri Cos OI Contrast  Result Date: 12/29/2018 CLINICAL DATA:  Ataxia EXAM: MRI HEAD WITHOUT AND WITH CONTRAST TECHNIQUE: Multiplanar, multiecho pulse sequences of the brain and surrounding structures were obtained without and with intravenous contrast. CONTRAST:  6 mL Gadavist COMPARISON:  Head CT 12/29/2018 FINDINGS: Examination is severely degraded by motion. BRAIN: Abnormal diffusion restriction along the cortex of the left anterior MCA territory. The midline structures are normal. No midline shift or other mass effect. Multifocal white matter hyperintensity, most commonly due to chronic ischemic microangiopathy. Generalized atrophy without lobar predilection. Susceptibility-sensitive sequences show no chronic microhemorrhage or superficial siderosis. No abnormal contrast enhancement. VASCULAR: The major intracranial arterial and venous sinus flow voids are normal. SKULL AND UPPER CERVICAL SPINE: Calvarial bone marrow signal is normal. There is no skull base mass. Visualized upper cervical spine and soft tissues are normal. SINUSES/ORBITS: No fluid levels or advanced mucosal thickening. No mastoid or middle ear effusion. The orbits are normal. IMPRESSION: 1. 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 No hemorrhage or mass effect. 2. Chronic ischemic microvascular disease. 3. Severely motion degraded study. Electronically Signed   By: Ulyses Jarred M.D.   On: 12/29/2018 19:42   Dg Chest Portable 1 View  Result Date: 12/29/2018 CLINICAL DATA:  Hypoxia, fever. EXAM: PORTABLE CHEST 1 VIEW COMPARISON:  None. FINDINGS: The heart size and  mediastinal contours are within normal limits. Atherosclerosis of thoracic aorta is noted. Right internal jugular Port-A-Cath is unchanged in position. No pneumothorax is noted. Mild bilateral perihilar and basilar opacities are noted concerning for edema or possibly inflammation. Small pleural effusions may be present. The visualized skeletal structures are unremarkable. IMPRESSION: Mild bilateral perihilar and basilar opacities are noted concerning for edema or possibly inflammation. Small pleural effusions. Aortic Atherosclerosis (ICD10-I70.0). Electronically Signed   By: Marijo Conception M.D.   On: 12/29/2018 14:23   Vas US Carotid  Result Date: 12/30/2018 Carotid Arterial Duplex Study Indications: TIA. Performing Technologist: Oliver Hum RVT  Examination Guidelines: A complete evaluation includes B-mode imaging, spectral Doppler, color Doppler, and power Doppler as needed of all accessible portions of each vessel. Bilateral testing is considered an integral part of a complete examination. Limited examinations for reoccurring indications may be performed as noted.  Right Carotid Findings: +----------+--------+--------+--------+-----------------------+--------+           PSV cm/sEDV cm/sStenosisDescribe               Comments +----------+--------+--------+--------+-----------------------+--------+ CCA Prox  62      8               smooth and heterogenous         +----------+--------+--------+--------+-----------------------+--------+ CCA  Distal68      16              smooth and heterogenous         +----------+--------+--------+--------+-----------------------+--------+ ICA Prox  122     47              smooth and heterogenoustortuous +----------+--------+--------+--------+-----------------------+--------+ ICA Distal62      21                                     tortuous +----------+--------+--------+--------+-----------------------+--------+ ECA       115     9                                                +----------+--------+--------+--------+-----------------------+--------+ +----------+--------+-------+--------+-------------------+           PSV cm/sEDV cmsDescribeArm Pressure (mmHG) +----------+--------+-------+--------+-------------------+ [id number]                                        +----------+--------+-------+--------+-------------------+ +---------+--------+--+--------+--+---------+ VertebralPSV cm/s60EDV cm/s17Antegrade +---------+--------+--+--------+--+---------+  Left Carotid Findings: +----------+--------+-------+--------+--------------------------------+--------+           PSV cm/sEDV    StenosisDescribe                        Comments                   cm/s                                                    +----------+--------+-------+--------+--------------------------------+--------+ CCA Prox  80      13             smooth and heterogenous                  +----------+--------+-------+--------+--------------------------------+--------+ CCA Distal48      10             smooth and heterogenous                  +----------+--------+-------+--------+--------------------------------+--------+ ICA Prox  74      13             smooth, heterogenous and                                                  calcific                                 +----------+--------+-------+--------+--------------------------------+--------+ ICA Distal41      16                                             tortuous +----------+--------+-------+--------+--------------------------------+--------+ ECA       179  20                                                      +----------+--------+-------+--------+--------------------------------+--------+ +----------+--------+--------+--------+-------------------+ SubclavianPSV cm/sEDV cm/sDescribeArm Pressure (mmHG)  +----------+--------+--------+--------+-------------------+           158                                         +----------+--------+--------+--------+-------------------+ +---------+--------+--+--------+--+---------+ VertebralPSV cm/s60EDV cm/s22Antegrade +---------+--------+--+--------+--+---------+  Summary: Right Carotid: Velocities in the right ICA are consistent with a 40-59%                stenosis. Left Carotid: Velocities in the left ICA are consistent with a 1-39% stenosis. Vertebrals: Bilateral vertebral arteries demonstrate antegrade flow. *See table(s) above for measurements and observations.  Electronically signed by Antony Contras MD on 12/30/2018 at 12:25:45 PM.    Final

## 2018-12-31 NOTE — Progress Notes (Signed)
STROKE TEAM PROGRESS NOTE   INTERVAL HISTORY I have personally obtained history of present illness after discussion with patient, review of chart and discussion with patient's niece over the phone.  She remains aphasic with dense right hemiplegia.  MRI confirms patchy left MCA frontal infarct carotid ultrasound shows no significant extracranial stenosis.  LDL cholesterol is elevated at 175 mg percent.  Hemoglobin A1c is 5.3.  Transthoracic echo is unremarkable.  Patient has advanced uterine cancer and is on chemotherapy  Vitals:   12/31/18 0244 12/31/18 0408 12/31/18 0742 12/31/18 1144  BP: (!) 167/69 (!) 144/60 (!) 160/71 (!) 147/63  Pulse: 89 79 90 89  Resp: 18 18 18 18   Temp: 98.2 F (36.8 C) 98.7 F (37.1 C) 99.8 F (37.7 C) 98.6 F (37 C)  TempSrc: Oral Oral Oral Oral  SpO2: 100% 100% 99% 99%  Weight:      Height:        CBC:  Recent Labs  Lab 12/30/18 0639 12/31/18 0436  WBC 2.7* 8.6  NEUTROABS 0.6* 6.9  HGB 7.1* 8.5*  HCT 22.2* 26.0*  MCV 96.9 92.5  PLT 258 497    Basic Metabolic Panel:  Recent Labs  Lab 12/30/18 0639 12/31/18 0436  NA 129* 132*  K 3.4* 3.6  CL 98 99  CO2 26 23  GLUCOSE 87 71  BUN 8 5*  CREATININE 0.46 0.56  CALCIUM 7.9* 8.3*   Lipid Panel:     Component Value Date/Time   CHOL 121 12/31/2018 0436   TRIG 75 12/31/2018 0436   HDL 31 (L) 12/31/2018 0436   CHOLHDL 3.9 12/31/2018 0436   VLDL 15 12/31/2018 0436   LDLCALC 75 12/31/2018 0436   HgbA1c:  Lab Results  Component Value Date   HGBA1C 5.3 12/31/2018    PHYSICAL EXAM Pleasant elderly lady not in distress. . Afebrile. Head is nontraumatic. Neck is supple without bruit.    Cardiac exam no murmur or gallop. Lungs are clear to auscultation. Distal pulses are well felt. Neurological Exam :  Patient is awake alert globally aphasic with severe expressive aphasia and able to speak only occasional words but not sentences.  She also has moderate comprehension deficits and is unable to  name and repeat.  She can follow only simple midline and occasional one-step commands.  She blinks to threat on the left but not on the right.  Extraocular movements are full range.  There is mild right lower facial weakness.  Tongue is midline.  She has dense right hemiplegia and will withdraw the right lower extremity partially to painful stimuli but not the right upper extremity.  She has purposeful antigravity movements in the left side.  ASSESSMENT/PLAN Ms. BLOSSOM CRUME is a 73 y.o. female with history of stroke with resultant right hemiparesis, neuropathy, knee pain, HTN, cervical cancer with mets to the lung and lymph nodes, chronic anemia, hyperlipidemia and diabetes initially presenting with a long hospital with shortness of breath.  In ED found to have inability to talk and right hemapheresis.  MRI showed a new acute cortical infarct in the anterior left MCA territory.  She was transferred to [facility] for admission and further medical treatment for her pneumonia  Stroke:   Left MCA branch infarct felt to be embolic in setting of metastatic cervical cancer  CT head no acute abnormality.  Mild atrophy.  Mild small vessel disease.  MRI acute cortical left MCA infarct.  Chronic small vessel disease.  Carotid Doppler left ICA normal, right  ICA 40 to 59% stenosis this year.  B VA antegrade  2D Echo EF 60 to 65%.  Trivial pericardial effusion.  Severe mitral annular calcification.  Inferior vena cava dilated greater than 50% respiratory variability.  TCD pending  LDL 75  HgbA1c 5.3  Lovenox 40 mg sq daily for VTE prophylaxis  clopidogrel 75 mg daily prior to admission, now on aspirin 325mg  daily. . We will not order further work-up for atrial fibrillation like TEE or prolonged cardiac monitoring as patient is not a good long-term anticoagulation candidate.  Therapy recommendations: CIR  Disposition: Pending Her oncologist overall prognosis is very poor.  She has extensive cervical cancer  and treatment call was strictly palliative in nature at baseline.  If she does not improve neurologically, she will not be able to proceed with further chemotherapy.  Hypertension  Stable . Permissive hypertension (OK if < 220/120) but gradually normalize in 5-7 days . Long-term BP goal normotensive  Hyperlipidemia  Home meds: Lipitor 40, resumed in hospital  LDL 75, goal < 70  Continue statin at discharge if ongoing aggressive care continued at that time  Diabetes type II Uncontrolled  HgbA1c 5.3, at goal < 7.0  CBGs  SSI  Other Stroke Risk Factors  Advanced age  Former cigarette smoker  Hx stroke/TIA  Known stroke with resultant right hemiparesis.  Details not available in epic or care everywhere  Other Active Problems  Cervical cancer.  Her oncologist overall prognosis is very poor.  She has extensive cervical cancer and treatment call was strictly palliative in nature at baseline.  If she does not improve neurologically, she will not be able to proceed with further chemotherapy.  anemia due to recent chemo without response to blood transfusion  Neutropenia, resolved after 1 dose of G-CSF 12/30/2018  Bilateral pneumonia with neutropenic fever.  COVID test was negative.  ? related to aspiration  Hospital day # 2 I have personally obtained history,examined this patient, reviewed notes, independently viewed imaging studies, participated in medical decision making and plan of care.ROS completed by me personally and pertinent positives fully documented  I have made any additions or clarifications directly to the above note.   Patient developed aphasia and dense right hemiplegia due to left MCA branch infarct etiology possibly related to hypercoagulability from cancer or chemotherapy.  Patient's prognosis is quite poor given his advanced uterine cancer and disabling stroke.  I spoke to patient's niece over the phone about considering palliative care and hospice.  She wants  to visit the patient before making a final decision.  Discussed with Dr. Jiles Prows.  Greater than 50% time during this 35-minute visit was spent on counseling and coordination of care about her embolic stroke and discussion about prognosis, evaluation and treatment plan and answering questions. Antony Contras, MD Medical Director [place] Pager: [phone] 12/31/2018 3:57 PM   To contact Stroke Continuity provider, please refer to [email or web address] After hours, contact General Neurology

## 2018-12-31 NOTE — Telephone Encounter (Signed)
Copied from [place] [phone]. Topic: General - Other >> Dec 31, 2018  9:10 AM Leward Quan A wrote: Reason for CRM: Katie Myers patient niece called to ask [facility] to give her a call in regards to there patient asking for a call back as soon as possible to ph# [phone]. She did not elaborate on what the issue was just asked for a call back soon.

## 2019-01-01 ENCOUNTER — Other Ambulatory Visit: Payer: Self-pay

## 2019-01-01 LAB — GLUCOSE, CAPILLARY
Glucose-Capillary: 72 mg/dL (ref 70–99)
Glucose-Capillary: 143 mg/dL — ABNORMAL HIGH (ref 70–99)
Glucose-Capillary: 154 mg/dL — ABNORMAL HIGH (ref 70–99)
Glucose-Capillary: 149 mg/dL — ABNORMAL HIGH (ref 70–99)

## 2019-01-01 LAB — URINALYSIS, ROUTINE W REFLEX MICROSCOPIC
Bilirubin Urine: NEGATIVE
Glucose, UA: NEGATIVE mg/dL
Ketones, ur: 5 mg/dL — AB
Nitrite: NEGATIVE
Protein, ur: 30 mg/dL — AB
Specific Gravity, Urine: 1.019 (ref 1.005–1.030)
WBC, UA: >50 WBC/hpf — ABNORMAL HIGH (ref 0–5)
pH: 5 (ref 5.0–8.0)

## 2019-01-01 LAB — CREATININE, SERUM
Creatinine, Ser: 0.5 mg/dL (ref 0.44–1.00)
GFR calc Af Amer: >60 mL/min (ref >60)
GFR calc non Af Amer: >60 mL/min (ref >60)

## 2019-01-01 MED ORDER — ASPIRIN EC 81 MG PO TBEC
81.0000 mg | DELAYED_RELEASE_TABLET | Freq: Every day | ORAL | Status: DC
  Administered 2019-01-01 – 2019-01-06 (×6): 81 mg via ORAL
  Filled 2019-01-01 (×6): qty 1

## 2019-01-01 MED ORDER — ENSURE ENLIVE PO LIQD
237.0000 mL | Freq: Two times a day (BID) | ORAL | Status: DC
  Administered 2019-01-01 – 2019-01-06 (×9): 237 mL via ORAL

## 2019-01-01 NOTE — Progress Notes (Addendum)
Marland Kitchen  PROGRESS NOTE    Katie Myers  EXB:[id number] DOB: 1945-08-28 DOA: 12/29/2018 PCP: [name], MD   Brief Narrative:   Patient is a 73 year old female with history of diabetes type 2, cervical cancer with mets to lungs and lymph node, ischemic stroke, chronic anemia, hypertension who was brought to the emergency department yesterday with complaints of shortness of breath,fever. She was being admitted for pneumonia and while in the emergency department shebecame confused, developed slurred speech and right-sided weakness. MRI of the brain showed acute cortical infarction of anterior left middle cerebral artery territory. She has been started on IV antibiotics for pneumonia. Neurology has been consulted and she has been transferred to [facility] today. She is also being followed by oncology.   Assessment & Plan:   Active Problems:   Cervical cancer ([place])   HCAP (healthcare-associated pneumonia)   Pressure injury of skin   Acute ischemic stroke ([place])   1. Acute ischemic stroke:      - MRI: Acute cortical infarct of the anterior left middle cerebral artery territory. No hemorrhage or mass effect.      - Patient is dysarthric, has slurred speech.  She is alert.  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     - Neurology consulted; appreciate assistance.     - Spoke with neurology; rec comfort care and no further stroke w/u  2. Neutropenic fever:      - Febrile on presentation.       - Suspected secondary to pneumonia.  Follow-up cultures.  Afebrile this morning.       - Oncology following  3. Possible healthcare associated pneumonia:      - Immunocompromised patient.       - Chest x-ray finding consistent with infiltrates.       - Continue current antibiotics.       - Covid-19 negative.     - cefepime  4. Hypertension:      - Antihypertensives on hold.       - Allow permissive hypertension.       - Gradually normalize blood pressure in 5 to 7 days.   Use PRN meds for hypertensive urgency.  5. Hyponatremia:      - Mild. Monitor BMP  6. History of metastatic cervical cancer     - Cervical cancer with mets to lymph nodes and lung.       - Dr. Alvy Bimler is following.  7. Type 2 diabetes mellitus     - Continue sliding scale insulin.       - On metformin at home.  8. Hyperlipidemia     - On Lipitor at home.  9. Hypokalemia     - replete as necessary  10. Chronic normocytic anemia     - Most likely secondary to malignancy, chemotherapy.      - s/p 1 unit of pRBCs as per oncology.  Will likely need SNF placement. Continue as above.   DVT prophylaxis: Lovenox Code Status: FULL Family Communication: Spoke with family at bedside about prognosis   Disposition Plan: TBD   Consultants:   Neurology  Oncology   Subjective: No acute events ON.   Objective: Vitals:   12/31/18 2331 01/01/19 0357 01/01/19 0754 01/01/19 1130  BP: (!) 158/58 (!) 162/59 (!) 162/65 (!) 134/58  Pulse: 78 75 75 84  Resp: 12 10 14 14   Temp: 98.7 F (37.1 C) 98.4 F (36.9 C) 98.6 F (37 C) 99.1 F (  37.3 C)  TempSrc: Oral Oral Oral Oral  SpO2: 100% 100% 100% 100%  Weight:      Height:        Intake/Output Summary (Last 24 hours) at 01/01/2019 1553 Last data filed at 01/01/2019 1436 Gross per 24 hour  Intake 1302.98 ml  Output 700 ml  Net 602.98 ml   Filed Weights   12/29/18 1331  Weight: 62.6 kg    Examination:  General: 73 y.o. female resting in bed in NAD Cardiovascular: RRR, +S1, S2, no m/g/r, equal pulses throughout Respiratory: Upper airway transmission,soft rhonchi noted b/l, normal WOB GI: BS+, NDNT, no masses noted, no organomegaly noted MSK: No e/c/c Skin: No rashes, bruises, ulcerations noted Neuro: expressive aphasic, follows some commands, right hemiplegia      Data Reviewed: I have personally reviewed following labs and imaging studies.  CBC: Recent Labs  Lab 12/29/18 1336 12/30/18 0639 12/31/18 0436    WBC 0.6* 2.7* 8.6  NEUTROABS 0.4* 0.6* 6.9  HGB 8.9* 7.1* 8.5*  HCT 27.7* 22.2* 26.0*  MCV 95.8 96.9 92.5  PLT 281 258 097   Basic Metabolic Panel: Recent Labs  Lab 12/29/18 1336 12/30/18 0639 12/31/18 0436 01/01/19 0417  NA 129* 129* 132*  --   K 3.8 3.4* 3.6  --   CL 93* 98 99  --   CO2 25 26 23   --   GLUCOSE 188* 87 71  --   BUN 6* 8 5*  --   CREATININE 0.60 0.46 0.56 0.50  CALCIUM 8.7* 7.9* 8.3*  --    GFR: Estimated Creatinine Clearance: 56.4 mL/min (by C-G formula based on SCr of 0.5 mg/dL). Liver Function Tests: Recent Labs  Lab 12/29/18 1336 12/30/18 0639  AST 30 23  ALT 20 16  ALKPHOS 85 61  BILITOT 1.2 1.1  PROT 8.2* 6.5  ALBUMIN 3.0* 2.4*   No results for input(s): LIPASE, AMYLASE in the last 168 hours. No results for input(s): AMMONIA in the last 168 hours. Coagulation Profile: No results for input(s): INR, PROTIME in the last 168 hours. Cardiac Enzymes: No results for input(s): CKTOTAL, CKMB, CKMBINDEX, TROPONINI in the last 168 hours. BNP (last 3 results) No results for input(s): PROBNP in the last 8760 hours. HbA1C: Recent Labs    12/30/18 0639 12/31/18 0436  HGBA1C 5.3 5.3   CBG: Recent Labs  Lab 12/31/18 1151 12/31/18 1604 12/31/18 2133 01/01/19 0617 01/01/19 1127  GLUCAP 98 101* 77 72 143*   Lipid Profile: Recent Labs    12/31/18 0436  CHOL 121  HDL 31*  LDLCALC 75  TRIG 75  CHOLHDL 3.9   Thyroid Function Tests: No results for input(s): TSH, T4TOTAL, FREET4, T3FREE, THYROIDAB in the last 72 hours. Anemia Panel: No results for input(s): VITAMINB12, FOLATE, FERRITIN, TIBC, IRON, RETICCTPCT in the last 72 hours. Sepsis Labs: Recent Labs  Lab 12/29/18 1336  LATICACIDVEN 1.7    Recent Results (from the past 240 hour(s))  Blood Culture (routine x 2)     Status: None (Preliminary result)   Collection Time: 12/29/18  1:36 PM  Result Value Ref Range Status   Specimen Description   Final    BLOOD LEFT  ANTECUBITAL Performed at [facility], [place] [address] [place], [place] [address]    Special Requests   Final    BOTTLES DRAWN AEROBIC AND ANAEROBIC Blood Culture adequate volume Performed at [facility] [address] [place], [place] [address]    Culture  Final    NO GROWTH 3 DAYS Performed at [facility], [place] [address] [place], [place] [address]    Report Status PENDING  Incomplete  Blood Culture (routine x 2)     Status: None (Preliminary result)   Collection Time: 12/29/18  1:52 PM  Result Value Ref Range Status   Specimen Description   Final    BLOOD RIGHT ANTECUBITAL Performed at [facility], [place] [address], [place], [place] [address]    Special Requests   Final    BOTTLES DRAWN AEROBIC AND ANAEROBIC Blood Culture results may not be optimal due to an excessive volume of blood received in culture bottles Performed at [facility] [address], [place], [place] [address]    Culture   Final    NO GROWTH 3 DAYS Performed at [facility], [place] [address], [place], [place] [address]    Report Status PENDING  Incomplete  SARS Coronavirus 2 (CEPHEID- Performed in [facility] hospital lab), Hosp Order     Status: None   Collection Time: 12/29/18  2:40 PM  Result Value Ref Range Status   SARS Coronavirus 2 NEGATIVE NEGATIVE Final    Comment: (NOTE) If result is NEGATIVE SARS-CoV-2 target nucleic acids are NOT DETECTED. The SARS-CoV-2 RNA is generally detectable in upper and lower  respiratory specimens during the acute phase of infection. The lowest  concentration of SARS-CoV-2 viral copies this assay can detect is 250  copies / mL. A negative result does not preclude SARS-CoV-2 infection  and should not be used as the sole basis for treatment or other  patient management decisions.  A negative result may occur with  improper specimen collection / handling, submission of specimen other  than nasopharyngeal  swab, presence of viral mutation(s) within the  areas targeted by this assay, and inadequate number of viral copies  (<250 copies / mL). A negative result must be combined with clinical  observations, patient history, and epidemiological information. If result is POSITIVE SARS-CoV-2 target nucleic acids are DETECTED. The SARS-CoV-2 RNA is generally detectable in upper and lower  respiratory specimens dur ing the acute phase of infection.  Positive  results are indicative of active infection with SARS-CoV-2.  Clinical  correlation with patient history and other diagnostic information is  necessary to determine patient infection status.  Positive results do  not rule out bacterial infection or co-infection with other viruses. If result is PRESUMPTIVE POSTIVE SARS-CoV-2 nucleic acids MAY BE PRESENT.   A presumptive positive result was obtained on the submitted specimen  and confirmed on repeat testing.  While 2019 novel coronavirus  (SARS-CoV-2) nucleic acids may be present in the submitted sample  additional confirmatory testing may be necessary for epidemiological  and / or clinical management purposes  to differentiate between  SARS-CoV-2 and other Sarbecovirus currently known to infect humans.  If clinically indicated additional testing with an alternate test  methodology [phone]) is advised. The SARS-CoV-2 RNA is generally  detectable in upper and lower respiratory sp ecimens during the acute  phase of infection. The expected result is Negative. Fact Sheet for Patients:  StrictlyIdeas.no Fact Sheet for Healthcare Providers: BankingDealers.co.za This test is not yet approved or cleared by the [place] FDA and has been authorized for detection and/or diagnosis of SARS-CoV-2 by FDA under an Emergency Use Authorization (EUA).  This EUA will remain in effect (meaning this test can be used) for the duration of the COVID-19 declaration  under Section 564(b)(1) of the Act, 21 U.S.C. section 360bbb-3(b)(1), unless the authorization is terminated or revoked sooner. Performed at [facility], [place] [address] [place], [place] [address]   MRSA PCR Screening     Status: None   Collection Time: 12/29/18  5:23 PM  Result Value Ref Range Status   MRSA by PCR NEGATIVE NEGATIVE Final    Comment:        The GeneXpert MRSA Assay (FDA approved for NASAL specimens only), is one component of a comprehensive MRSA colonization surveillance program. It is not intended to diagnose MRSA infection nor to guide or monitor treatment for MRSA infections. Performed at [facility], [place] [address] [place], [place] [address]          Radiology Studies: Dg Swallowing Func-speech Pathology  Result Date: 12/31/2018 Objective Swallowing Evaluation: Type of Study: MBS-Modified Barium Swallow Study  Patient Details Name: Katie Myers MRN: [id number] Date of Birth: 1946/07/11 Today's Date: 12/31/2018 Time: SLP Start Time (ACUTE ONLY): 1300 -SLP Stop Time (ACUTE ONLY): 1420 SLP Time Calculation (min) (ACUTE ONLY): 80 min Past Medical History: Past Medical History: Diagnosis Date  Cancer ([place])   cervical cancer  Diabetes mellitus without complication ([place])   Hyperlipidemia   Hypertension   Knee pain, bilateral   Neuropathy   Stroke ([place])   Underweight  Past Surgical History: Past Surgical History: Procedure Laterality Date  IR IMAGING GUIDED PORT INSERTION  11/05/2018 HPI: 73 year old female presented hospital with shortness of breath and inability to express herself.  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  She usually is able to talk and converse. There are no prior SLP notes in chart  No data  recorded Assessment / Plan / Recommendation CHL IP CLINICAL IMPRESSIONS 12/31/2018 Clinical Impression Pt demonstrates a mild to moderate oral dysphagia due to missing dentition and right CN VII weakenss and sensory deficit. There is prolonged mastication with oral residual. There is intermittent anterior spillage with most bites and sips. Occasional premature spillage also occurs, but no penetration or aspiration was observed. Will initiate a dysphagia 1 (puree) and thin liquid diet With postential to advance solids if tolerated at bedside without too much struggle or pocketing.  SLP Visit Diagnosis Dysphagia, oral phase (R13.11) Attention and concentration deficit following -- Frontal lobe and executive function deficit following -- Impact on safety and function --   CHL IP TREATMENT RECOMMENDATION 12/31/2018 Treatment Recommendations Defer until completion of intrumental exam   No flowsheet data found. CHL IP DIET RECOMMENDATION 12/31/2018 SLP Diet Recommendations Dysphagia 1 (Puree) solids;Thin liquid Liquid Administration via Cup;Straw Medication Administration Whole meds with puree Compensations Minimize environmental distractions;Monitor for anterior loss;Lingual sweep for clearance of pocketing;Follow solids with liquid Postural Changes Remain semi-upright after after feeds/meals (Comment)   CHL IP OTHER RECOMMENDATIONS 12/31/2018 Recommended Consults -- Oral Care Recommendations Oral care BID Other Recommendations --   CHL IP FOLLOW UP RECOMMENDATIONS 12/31/2018 Follow up Recommendations 24 hour supervision/assistance   CHL IP FREQUENCY AND DURATION 12/31/2018 Speech Therapy Frequency (ACUTE ONLY) min 2x/week Treatment Duration --      CHL IP ORAL PHASE 12/31/2018 Oral Phase Impaired Oral - Pudding Teaspoon -- Oral - Pudding Cup -- Oral - Honey Teaspoon -- Oral - Honey Cup -- Oral - Nectar Teaspoon --  Oral - Nectar Cup Right anterior bolus loss;Right pocketing in lateral sulci Oral - Nectar Straw Right anterior  bolus loss Oral - Thin Teaspoon -- Oral - Thin Cup Right anterior bolus loss Oral - Thin Straw Right anterior bolus loss Oral - Puree Right pocketing in lateral sulci Oral - Mech Soft Right pocketing in lateral sulci;Right anterior bolus loss Oral - Regular -- Oral - Multi-Consistency -- Oral - Pill -- Oral Phase - Comment --  CHL IP PHARYNGEAL PHASE 12/31/2018 Pharyngeal Phase WFL Pharyngeal- Pudding Teaspoon -- Pharyngeal -- Pharyngeal- Pudding Cup -- Pharyngeal -- Pharyngeal- Honey Teaspoon -- Pharyngeal -- Pharyngeal- Honey Cup -- Pharyngeal -- Pharyngeal- Nectar Teaspoon -- Pharyngeal -- Pharyngeal- Nectar Cup -- Pharyngeal -- Pharyngeal- Nectar Straw -- Pharyngeal -- Pharyngeal- Thin Teaspoon -- Pharyngeal -- Pharyngeal- Thin Cup -- Pharyngeal -- Pharyngeal- Thin Straw -- Pharyngeal -- Pharyngeal- Puree -- Pharyngeal -- Pharyngeal- Mechanical Soft -- Pharyngeal -- Pharyngeal- Regular -- Pharyngeal -- Pharyngeal- Multi-consistency -- Pharyngeal -- Pharyngeal- Pill -- Pharyngeal -- Pharyngeal Comment --  No flowsheet data found. Herbie Baltimore, MA CCC-SLP Acute Rehabilitation Services Pager [phone] Office [phone] Lynann Beaver 12/31/2018, 2:08 PM              Vas [place] Transcranial Doppler  Result Date: 01/01/2019  Transcranial Doppler Indications: Stroke. Limitations for diagnostic windows: Unable to insonate right transtemporal window. Unable to insonate left transtemporal window. Performing Technologist: June Leap RDMS, RVT  Examination Guidelines: A complete evaluation includes B-mode imaging, spectral Doppler, color Doppler, and power Doppler as needed of all accessible portions of each vessel. Bilateral testing is considered an integral part of a complete examination. Limited examinations for reoccurring indications may be performed as noted.  +----------+-------------+----------+-----------+------------------------------+  RIGHT TCD  Right VM (cm) Depth (cm) Pulsatility             Comment              +----------+-------------+----------+-----------+------------------------------+  Opthalmic      12.00                   1.52                                     +----------+-------------+----------+-----------+------------------------------+  ICA siphon    148.00                   1.43       difficult to obtain due to                                                            patient movement         +----------+-------------+----------+-----------+------------------------------+  Vertebral     -61.00                   1.61                                     +----------+-------------+----------+-----------+------------------------------+  +----------+------------+----------+-----------+-----------------------------+  LEFT TCD   Left VM (cm) Depth (cm) Pulsatility            Comment             +----------+------------+----------+-----------+-----------------------------+  Opthalmic     65.00                            Possible retrograde opthalmic  +----------+------------+----------+-----------+-----------------------------+  ICA siphon    100.00                    1                                     +----------+------------+----------+-----------+-----------------------------+  Vertebral     -42.00                  0.98                                    +----------+------------+----------+-----------+-----------------------------+  +------------+-------+-------+               VM cm/s Comment  +------------+-------+-------+  Prox Basilar -78.00           +------------+-------+-------+  Dist Basilar -60.00           +------------+-------+-------+ Summary:  Absent bitemporal windowslimits evaluation of anterior circulation.Mildly elevated bilateral carotid siphon,vertebrals and basilar artery mean flow velocities of unclear significance *See table(s) above for measurements and observations.  Diagnosing physician: Antony Contras MD Electronically signed by Antony Contras MD on 01/01/2019 at  8:10:49 AM.    Final         Scheduled Meds:  sodium chloride   Intravenous Once   acetaminophen  650 mg Oral Once   aspirin EC  81 mg Oral Daily   atorvastatin  40 mg Oral Daily   clopidogrel  75 mg Oral Daily   diphenhydrAMINE  25 mg Intravenous Once   enoxaparin (LOVENOX) injection  40 mg Subcutaneous Q24H   feeding supplement (ENSURE ENLIVE)  237 mL Oral BID BM   insulin aspart  0-5 Units Subcutaneous QHS   insulin aspart  0-9 Units Subcutaneous TID WC   sodium chloride flush  10-40 mL Intracatheter Q12H   Continuous Infusions:  sodium chloride 75 mL/hr at 01/01/19 1439   ceFEPime (MAXIPIME) IV 2 g (01/01/19 0854)     LOS: 3 days    Time spent: 35 minutes spent in the coordination of care today.    Jonnie Finner, DO [facility] Pager [phone]  If 7PM-7AM, please contact night-coverage [email or web address] Password [facility] 01/01/2019, 3:53 PM

## 2019-01-01 NOTE — Progress Notes (Signed)
  Speech Language Pathology Treatment: Cognitive-Linquistic(Aphasia )  Patient Details Name: Katie Myers MRN: [id number] DOB: 07-02-1946 Today's Date: 01/01/2019 Time: 0131-4388 SLP Time Calculation (min) (ACUTE ONLY): 18 min  Assessment / Plan / Recommendation Clinical Impression  Pt was seen for aphasia treatment and was cooperative throughout the session. She was able to vocalize the melody of the Happy Birthday song in unison with the SLP and imprecisely produced the word "day" during this task but was unable to produce any other automatic sequences despite max cues. She achieved 80% accuracy with simple yes/no question but demonstrated 50% accuracy with complex yes/no questions and perseveration was noted on "yes". She completed verbally-presented phrases with 20% accuracy increasing to 40% accuracy with max cues. She demonstrated 0% accuracy with confrontational naming despite prompts and cues. SLP will continue to follow.    HPI HPI: 73 year old female presented hospital with shortness of breath and inability to express herself.  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  She usually is able to talk and converse. There are no prior SLP notes in chart      SLP Plan  Continue with current plan of care       Recommendations                   Follow up Recommendations: Inpatient Rehab SLP Visit Diagnosis: Aphasia (R47.01) Plan: Continue with current plan of care       Katie Myers I. Hardin Negus, [place], [place] Office number [phone] Pager [phone]               Horton Marshall 01/01/2019, 2:11 PM

## 2019-01-01 NOTE — Progress Notes (Signed)
Katie Myers   DOB:February 22, 1946   TI#:[id number]    ASSESSMENT & PLAN:  Acute stroke She is a bit better today, more alert and able to follow simple commands.  I have reviewed recommendation by neurologist, speech and language therapist and physical therapist. Hopefully, she can regain some function with aggressive rehabilitation. I agree with CIR if she is a candidate  Poorly controlled diabetes She will continue insulin treatment as needed  Anemia Due to recent chemo. She has responded to blood transfusion. She does not need further transfusion support for now Recommend checking CBC at least once a week to follow  Neutropenia, resolved Due to recent chemotherapy She has received 1 dose of G-CSF yesterday with improvement of her white count Monitor closely.  She does not need further G-CSF support Recommend checking CBC next week to follow  Cervical cancer [facility]) The patient was seen byGYN oncologist on 12/25/2018  Recommendation was for a repeat PET scan prior to cycle #4, however, will put everything on hold for now Due to significant neurological deficit, it is unlikely she can benefit for future chemotherapy if she has no further improvement of her neurological deficit. I am hopeful, with  aggressive rehabilitation, she might regain function to the point that she can benefit from future chemotherapy.  Bilateral pneumonia, neutropenic fever She is currently on broad-spectrum IV antibiotics COVID 19 test was negative It is not clear whether the pneumonia could be triggered by aspiration. I would defer to primary service about duration of antibiotics treatment  Goals of care Overall, prognosis is poor She has extensive stage cervical cancer and treatment goal was strictly palliative in nature If she does not recover or retain reasonable function after her acute stroke, she will not be able to proceed with further chemotherapy I have updated her next of kin, [name], her niece  today and have addressed all her questions. For now, I recommend aggressive supportive care and CIR if she is a candidate I will continue to follow  All questions were answered. The patient knows to call the clinic with any problems, questions or concerns.   Heath Lark, MD 01/01/2019 8:13 AM  Subjective:  She was sleeping when I walked in but opens her eyes readily when I call her name.  She was able to follow simple commands.  Continue to be a phasic with signs of right hemiparesis. I have reviewed recommendation by neurologist, speech and language therapist, and primary service  Objective:  Vitals:   01/01/19 0357 01/01/19 0754  BP: (!) 162/59 (!) 162/65  Pulse: 75 75  Resp: 10 14  Temp: 98.4 F (36.9 C) 98.6 F (37 C)  SpO2: 100% 100%     Intake/Output Summary (Last 24 hours) at 01/01/2019 0813 Last data filed at 01/01/2019 0406 Gross per 24 hour  Intake 932.98 ml  Output 700 ml  Net 232.98 ml    GENERAL:alert, no distress and comfortable NEURO: alert and able to follow simple commands.  A phasic.  Right hemiparesis noted.  Bilateral lower extremity edema is noted   Labs:  Recent Labs    12/18/18 1118  12/29/18 1336 12/30/18 0639 12/31/18 0436 01/01/19 0417  NA 136  --  129* 129* 132*  --   K 3.8  --  3.8 3.4* 3.6  --   CL 98  --  93* 98 99  --   CO2 27  --  25 26 23   --   GLUCOSE 194*  --  188* 87 71  --  BUN 9  --  6* 8 5*  --   CREATININE 0.70   < > 0.60 0.46 0.56 0.50  CALCIUM 8.7*  --  8.7* 7.9* 8.3*  --   GFRNONAA >60   < > >60 >60 >60 >60  GFRAA >60   < > >60 >60 >60 >60  PROT 8.2*  --  8.2* 6.5  --   --   ALBUMIN 2.9*  --  3.0* 2.4*  --   --   AST 31  --  30 23  --   --   ALT 17  --  20 16  --   --   ALKPHOS 89  --  85 61  --   --   BILITOT 0.4  --  1.2 1.1  --   --    < > = values in this interval not displayed.    Studies:  Ct Head Wo Contrast  Result Date: 12/29/2018 CLINICAL DATA:  Altered level of consciousness. History of cervical  cancer. EXAM: CT HEAD WITHOUT CONTRAST TECHNIQUE: Contiguous axial images were obtained from the base of the skull through the vertex without intravenous contrast. COMPARISON:  None. FINDINGS: Brain: Mild atrophy. Patchy white matter hypodensity bilaterally appears chronic. No acute infarct, hemorrhage, or mass lesion. Vascular: Atherosclerotic calcification in the carotid and vertebral arteries. Skull: Negative Sinuses/Orbits: Negative Other: None IMPRESSION: No acute abnormality. Mild atrophy and mild chronic microvascular ischemic type changes in the white matter. Electronically Signed   By: Franchot Gallo M.D.   On: 12/29/2018 16:37   Mr Jeri Cos AS Contrast  Result Date: 12/29/2018 CLINICAL DATA:  Ataxia EXAM: MRI HEAD WITHOUT AND WITH CONTRAST TECHNIQUE: Multiplanar, multiecho pulse sequences of the brain and surrounding structures were obtained without and with intravenous contrast. CONTRAST:  6 mL Gadavist COMPARISON:  Head CT 12/29/2018 FINDINGS: Examination is severely degraded by motion. BRAIN: Abnormal diffusion restriction along the cortex of the left anterior MCA territory. The midline structures are normal. No midline shift or other mass effect. Multifocal white matter hyperintensity, most commonly due to chronic ischemic microangiopathy. Generalized atrophy without lobar predilection. Susceptibility-sensitive sequences show no chronic microhemorrhage or superficial siderosis. No abnormal contrast enhancement. VASCULAR: The major intracranial arterial and venous sinus flow voids are normal. SKULL AND UPPER CERVICAL SPINE: Calvarial bone marrow signal is normal. There is no skull base mass. Visualized upper cervical spine and soft tissues are normal. SINUSES/ORBITS: No fluid levels or advanced mucosal thickening. No mastoid or middle ear effusion. The orbits are normal. IMPRESSION: 1. 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 No hemorrhage or mass effect. 2.  Chronic ischemic microvascular disease. 3. Severely motion degraded study. Electronically Signed   By: Ulyses Jarred M.D.   On: 12/29/2018 19:42   Dg Chest Portable 1 View  Result Date: 12/29/2018 CLINICAL DATA:  Hypoxia, fever. EXAM: PORTABLE CHEST 1 VIEW COMPARISON:  None. FINDINGS: The heart size and mediastinal contours are within normal limits. Atherosclerosis of thoracic aorta is noted. Right internal jugular Port-A-Cath is unchanged in position. No pneumothorax is noted. Mild bilateral perihilar and basilar opacities are noted concerning for edema or possibly inflammation. Small pleural effusions may be present. The visualized skeletal structures are unremarkable. IMPRESSION: Mild bilateral perihilar and basilar opacities are noted concerning for edema or possibly inflammation. Small pleural effusions. Aortic Atherosclerosis (ICD10-I70.0). Electronically Signed   By: Marijo Conception M.D.   On: 12/29/2018 14:23   Dg Swallowing Func-speech Pathology  Result Date:  12/31/2018 Objective Swallowing Evaluation: Type of Study: MBS-Modified Barium Swallow Study  Patient Details Name: MALYNN LUCY MRN: [id number] Date of Birth: October 01, 1945 Today's Date: 12/31/2018 Time: SLP Start Time (ACUTE ONLY): 1300 -SLP Stop Time (ACUTE ONLY): 1420 SLP Time Calculation (min) (ACUTE ONLY): 80 min Past Medical History: Past Medical History: Diagnosis Date . Cancer (HCC)   cervical cancer . Diabetes mellitus without complication ([place])  . Hyperlipidemia  . Hypertension  . Knee pain, bilateral  . Neuropathy  . Stroke ([place])  . Underweight  Past Surgical History: Past Surgical History: Procedure Laterality Date . IR IMAGING GUIDED PORT INSERTION  11/05/2018 HPI: 73 year old female presented hospital with shortness of breath and inability to express herself.  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  Per daughter at baseline  patient can walk with a cane but has right-sided weakness from previous stroke.  She usually is able to talk and converse. There are no prior SLP notes in chart  No data recorded Assessment / Plan / Recommendation CHL IP CLINICAL IMPRESSIONS 12/31/2018 Clinical Impression Pt demonstrates a mild to moderate oral dysphagia due to missing dentition and right CN VII weakenss and sensory deficit. There is prolonged mastication with oral residual. There is intermittent anterior spillage with most bites and sips. Occasional premature spillage also occurs, but no penetration or aspiration was observed. Will initiate a dysphagia 1 (puree) and thin liquid diet With postential to advance solids if tolerated at bedside without too much struggle or pocketing.  SLP Visit Diagnosis Dysphagia, oral phase (R13.11) Attention and concentration deficit following -- Frontal lobe and executive function deficit following -- Impact on safety and function --   CHL IP TREATMENT RECOMMENDATION 12/31/2018 Treatment Recommendations Defer until completion of intrumental exam   No flowsheet data found. CHL IP DIET RECOMMENDATION 12/31/2018 SLP Diet Recommendations Dysphagia 1 (Puree) solids;Thin liquid Liquid Administration via Cup;Straw Medication Administration Whole meds with puree Compensations Minimize environmental distractions;Monitor for anterior loss;Lingual sweep for clearance of pocketing;Follow solids with liquid Postural Changes Remain semi-upright after after feeds/meals (Comment)   CHL IP OTHER RECOMMENDATIONS 12/31/2018 Recommended Consults -- Oral Care Recommendations Oral care BID Other Recommendations --   CHL IP FOLLOW UP RECOMMENDATIONS 12/31/2018 Follow up Recommendations 24 hour supervision/assistance   CHL IP FREQUENCY AND DURATION 12/31/2018 Speech Therapy Frequency (ACUTE ONLY) min 2x/week Treatment Duration --      CHL IP ORAL PHASE 12/31/2018 Oral Phase Impaired Oral - Pudding Teaspoon -- Oral - Pudding Cup -- Oral - Honey  Teaspoon -- Oral - Honey Cup -- Oral - Nectar Teaspoon -- Oral - Nectar Cup Right anterior bolus loss;Right pocketing in lateral sulci Oral - Nectar Straw Right anterior bolus loss Oral - Thin Teaspoon -- Oral - Thin Cup Right anterior bolus loss Oral - Thin Straw Right anterior bolus loss Oral - Puree Right pocketing in lateral sulci Oral - Mech Soft Right pocketing in lateral sulci;Right anterior bolus loss Oral - Regular -- Oral - Multi-Consistency -- Oral - Pill -- Oral Phase - Comment --  CHL IP PHARYNGEAL PHASE 12/31/2018 Pharyngeal Phase WFL Pharyngeal- Pudding Teaspoon -- Pharyngeal -- Pharyngeal- Pudding Cup -- Pharyngeal -- Pharyngeal- Honey Teaspoon -- Pharyngeal -- Pharyngeal- Honey Cup -- Pharyngeal -- Pharyngeal- Nectar Teaspoon -- Pharyngeal -- Pharyngeal- Nectar Cup -- Pharyngeal -- Pharyngeal- Nectar Straw -- Pharyngeal -- Pharyngeal- Thin Teaspoon -- Pharyngeal -- Pharyngeal- Thin Cup -- Pharyngeal --  Pharyngeal- Thin Straw -- Pharyngeal -- Pharyngeal- Puree -- Pharyngeal -- Pharyngeal- Mechanical Soft -- Pharyngeal -- Pharyngeal- Regular -- Pharyngeal -- Pharyngeal- Multi-consistency -- Pharyngeal -- Pharyngeal- Pill -- Pharyngeal -- Pharyngeal Comment --  No flowsheet data found. Herbie [place], MA CCC-SLP Acute Rehabilitation Services Pager [phone] Office [phone] Lynann Beaver 12/31/2018, 2:08 PM              Vas US Carotid  Result Date: 12/30/2018 Carotid Arterial Duplex Study Indications: TIA. Performing Technologist: Oliver Hum RVT  Examination Guidelines: A complete evaluation includes B-mode imaging, spectral Doppler, color Doppler, and power Doppler as needed of all accessible portions of each vessel. Bilateral testing is considered an integral part of a complete examination. Limited examinations for reoccurring indications may be performed as noted.  Right Carotid Findings: +----------+--------+--------+--------+-----------------------+--------+           PSV  cm/sEDV cm/sStenosisDescribe               Comments +----------+--------+--------+--------+-----------------------+--------+ CCA Prox  62      8               smooth and heterogenous         +----------+--------+--------+--------+-----------------------+--------+ CCA Distal68      16              smooth and heterogenous         +----------+--------+--------+--------+-----------------------+--------+ ICA Prox  122     47              smooth and heterogenoustortuous +----------+--------+--------+--------+-----------------------+--------+ ICA Distal62      21                                     tortuous +----------+--------+--------+--------+-----------------------+--------+ ECA       115     9                                               +----------+--------+--------+--------+-----------------------+--------+ +----------+--------+-------+--------+-------------------+           PSV cm/sEDV cmsDescribeArm Pressure (mmHG) +----------+--------+-------+--------+-------------------+ [id number]                                        +----------+--------+-------+--------+-------------------+ +---------+--------+--+--------+--+---------+ VertebralPSV cm/s60EDV cm/s17Antegrade +---------+--------+--+--------+--+---------+  Left Carotid Findings: +----------+--------+-------+--------+--------------------------------+--------+           PSV cm/sEDV    StenosisDescribe                        Comments                   cm/s                                                    +----------+--------+-------+--------+--------------------------------+--------+ CCA Prox  80      13             smooth and heterogenous                  +----------+--------+-------+--------+--------------------------------+--------+ CCA Distal48      10  smooth and heterogenous                   +----------+--------+-------+--------+--------------------------------+--------+ ICA Prox  74      13             smooth, heterogenous and                                                  calcific                                 +----------+--------+-------+--------+--------------------------------+--------+ ICA Distal41      16                                             tortuous +----------+--------+-------+--------+--------------------------------+--------+ ECA       179     20                                                      +----------+--------+-------+--------+--------------------------------+--------+ +----------+--------+--------+--------+-------------------+ SubclavianPSV cm/sEDV cm/sDescribeArm Pressure (mmHG) +----------+--------+--------+--------+-------------------+           158                                         +----------+--------+--------+--------+-------------------+ +---------+--------+--+--------+--+---------+ VertebralPSV cm/s60EDV cm/s22Antegrade +---------+--------+--+--------+--+---------+  Summary: Right Carotid: Velocities in the right ICA are consistent with a 40-59%                stenosis. Left Carotid: Velocities in the left ICA are consistent with a 1-39% stenosis. Vertebrals: Bilateral vertebral arteries demonstrate antegrade flow. *See table(s) above for measurements and observations.  Electronically signed by Antony Contras MD on 12/30/2018 at 12:25:45 PM.    Final    Vas [place] Transcranial Doppler  Result Date: 01/01/2019  Transcranial Doppler Indications: Stroke. Limitations for diagnostic windows: Unable to insonate right transtemporal window. Unable to insonate left transtemporal window. Performing Technologist: June Leap RDMS, RVT  Examination Guidelines: A complete evaluation includes B-mode imaging, spectral Doppler, color Doppler, and power Doppler as needed of all accessible portions of each vessel. Bilateral testing  is considered an integral part of a complete examination. Limited examinations for reoccurring indications may be performed as noted.  +----------+-------------+----------+-----------+------------------------------+ RIGHT TCD Right VM (cm)Depth (cm)Pulsatility           Comment             +----------+-------------+----------+-----------+------------------------------+ Opthalmic     12.00                 1.52                                   +----------+-------------+----------+-----------+------------------------------+ ICA siphon   148.00                 1.43      difficult to obtain due to  patient movement        +----------+-------------+----------+-----------+------------------------------+ Vertebral    -61.00                 1.61                                   +----------+-------------+----------+-----------+------------------------------+  +----------+------------+----------+-----------+-----------------------------+ LEFT TCD  Left VM (cm)Depth (cm)Pulsatility           Comment            +----------+------------+----------+-----------+-----------------------------+ Opthalmic    65.00                         Possible retrograde opthalmic +----------+------------+----------+-----------+-----------------------------+ ICA siphon   100.00                  1                                   +----------+------------+----------+-----------+-----------------------------+ Vertebral    -42.00                0.98                                  +----------+------------+----------+-----------+-----------------------------+  +------------+-------+-------+             VM cm/sComment +------------+-------+-------+ Prox Basilar-78.00         +------------+-------+-------+ Dist Basilar-60.00         +------------+-------+-------+ Summary:  Absent bitemporal windowslimits evaluation of anterior  circulation.Mildly elevated bilateral carotid siphon,vertebrals and basilar artery mean flow velocities of unclear significance *See table(s) above for measurements and observations.  Diagnosing physician: Antony Contras MD Electronically signed by Antony Contras MD on 01/01/2019 at 8:10:49 AM.    Final

## 2019-01-01 NOTE — Progress Notes (Addendum)
Physical medicine rehab consult requested chart reviewed.  Patient with left MCA infarction dense right side weakness and aphasia as well as extensive cervical cancer followed by oncology services strictly palliative in nature at this time patient not able to participate with aggressive inpatient rehab services.  Please refer to admission coordinator note of 12/31/2018.

## 2019-01-01 NOTE — Care Management Important Message (Signed)
Important Message  Patient Details  Name: Katie Myers MRN: [id number] Date of Birth: 02-07-1946   Medicare Important Message Given:  Yes    [name] 01/01/2019, 2:36 PM

## 2019-01-01 NOTE — Progress Notes (Signed)
Nutrition Follow-up   RD working remotely.  DOCUMENTATION CODES:   (unable to assess for malnutrition at this time.)  INTERVENTION:  Provide Ensure Enlive po BID, each supplement provides 350 kcal and 20 grams of protein  Encourage adequate PO intake.   NUTRITION DIAGNOSIS:   Increased nutrient needs related to chronic illness, cancer and cancer related treatments, wound healing as evidenced by estimated needs; ongoing  GOAL:   Patient will meet greater than or equal to 90% of their needs; progressing  MONITOR:   PO intake, Supplement acceptance, Diet advancement, Weight trends, Labs, Skin, I & O's  REASON FOR ASSESSMENT:   Other (Comment)(pressure injury report)    ASSESSMENT:   73 y.o. female with medical history significant of type 2 DM, cervical cancer with mets to lung and lymph nodes undergoing chemotherapy, chronic iron deficiency anemia, stroke with associated R-sided weakness, and HTN. She presented to the ED d/t SOB. She had also presented to the ED the day prior d/t inability to have a BM. Disimpaction done at that time and patient had been discharged home. No N/V/D, cough, or fever PTA. Patient very poor PO intakes PTA, per family report. COVID-19 negative in the ED. MRI: Acute cortical infarct of the anterior left middle cerebral artery territory. Pt with HCAP, Chest x-ray finding consistent with infiltrates.    Diet has been advanced to a dysphagia 1 diet with thin liquids. Meal completion 95%. RD to order nutritional supplements to aid in caloric and protein needs. Per oncology MD, pt with poor prognosis. If neurological deficits do not improve, pt will not be able to proceed with further chemotherapy. RD to continue to monitor. Labs and medications reviewed.   Diet Order:   Diet Order            DIET - DYS 1 Room service appropriate? Yes; Fluid consistency: Thin  Diet effective now              EDUCATION NEEDS:   Not appropriate for education at this  time  Skin:  Skin Assessment: Skin Integrity Issues: Skin Integrity Issues:: Stage II Stage II: L hip  Last BM:  5/10  Height:   Ht Readings from Last 1 Encounters:  12/29/18 5\' 5"  (1.651 m)    Weight:   Wt Readings from Last 1 Encounters:  12/29/18 62.6 kg    Ideal Body Weight:  56.8 kg  BMI:  Body mass index is 22.96 kg/m.  Estimated Nutritional Needs:   Kcal:  2000-2200  Protein:  100-115 grams  Fluid:  >/= 2 L/day    Corrin Parker, MS, RD, LDN Pager # [phone] After hours/ weekend pager # [phone]

## 2019-01-01 NOTE — Progress Notes (Signed)
STROKE TEAM PROGRESS NOTE   INTERVAL HISTORY Patient is sitting up in bed.  She is more alert and slightly more interactive but remains globally aphasic with right hemiplegia.  Vitals:   12/31/18 2331 01/01/19 0357 01/01/19 0754 01/01/19 1130  BP: (!) 158/58 (!) 162/59 (!) 162/65 (!) 134/58  Pulse: 78 75 75 84  Resp: 12 10 14 14   Temp: 98.7 F (37.1 C) 98.4 F (36.9 C) 98.6 F (37 C) 99.1 F (37.3 C)  TempSrc: Oral Oral Oral Oral  SpO2: 100% 100% 100% 100%  Weight:      Height:        CBC:  Recent Labs  Lab 12/30/18 0639 12/31/18 0436  WBC 2.7* 8.6  NEUTROABS 0.6* 6.9  HGB 7.1* 8.5*  HCT 22.2* 26.0*  MCV 96.9 92.5  PLT 258 607    Basic Metabolic Panel:  Recent Labs  Lab 12/30/18 0639 12/31/18 0436 01/01/19 0417  NA 129* 132*  --   K 3.4* 3.6  --   CL 98 99  --   CO2 26 23  --   GLUCOSE 87 71  --   BUN 8 5*  --   CREATININE 0.46 0.56 0.50  CALCIUM 7.9* 8.3*  --    Lipid Panel:     Component Value Date/Time   CHOL 121 12/31/2018 0436   TRIG 75 12/31/2018 0436   HDL 31 (L) 12/31/2018 0436   CHOLHDL 3.9 12/31/2018 0436   VLDL 15 12/31/2018 0436   LDLCALC 75 12/31/2018 0436   HgbA1c:  Lab Results  Component Value Date   HGBA1C 5.3 12/31/2018    PHYSICAL EXAM: Pleasant elderly lady not in distress. . Afebrile. Head is nontraumatic. Neck is supple without bruit.    Cardiac exam no murmur or gallop. Lungs are clear to auscultation. Distal pulses are well felt. Neurological Exam :  Patient is awake alert globally aphasic with severe expressive aphasia and able to speak only occasional words but not sentences.  She also has moderate comprehension deficits and is unable to name and repeat.  She can follow only simple midline and occasional one-step commands.  She blinks to threat on the left but not on the right.  Extraocular movements are full range.  There is mild right lower facial weakness.  Tongue is midline.  She has dense right hemiplegia and will  withdraw the right lower extremity partially to painful stimuli but not the right upper extremity.  She has purposeful antigravity movements in the left side.  ASSESSMENT/PLAN Katie Myers is a 73 y.o. female with history of stroke with resultant right hemiparesis, neuropathy, knee pain, HTN, cervical cancer with mets to the lung and lymph nodes, chronic anemia, hyperlipidemia and diabetes initially presenting with a long hospital with shortness of breath.  In ED found to have inability to talk and right hemapheresis.  MRI showed a new acute cortical infarct in the anterior left MCA territory.  She was transferred to [facility] for admission and further medical treatment for her pneumonia  Stroke:   Left MCA branch infarct felt to be embolic from hypercoagulable status in setting of metastatic cervical cancer vs possible AF  CT head no acute abnormality.  Mild atrophy.  Mild small vessel disease.  MRI acute cortical left MCA infarct.  Chronic small vessel disease.  Carotid Doppler left ICA normal, right ICA 40 to 59% stenosis this year.  B VA antegrade  2D Echo EF 60 to 65%.  Trivial pericardial effusion.  Severe mitral annular calcification.  Inferior vena cava dilated greater than 50% respiratory variability.  TCD absent bitemporal windows. Mildly elevated B carotid siphon, betebrals and VA flow velocities  LDL 75  HgbA1c 5.3  Lovenox 40 mg sq daily for VTE prophylaxis  clopidogrel 75 mg daily prior to admission, now on Plavix daily. Given stroke, recommend aspirin 81 mg and plavix 75 mg daily x 3 weeks, then PLAVIX alone. Orders adjusted.   We will not order further work-up for atrial fibrillation like TEE or prolonged cardiac monitoring as patient is not a good long-term anticoagulation candidate.  Therapy recommendations: CIR  Disposition: Pending  Hypertension  Stable . Permissive hypertension (OK if < 220/120) but gradually normalize in 5-7 days . Long-term BP goal  normotensive  Hyperlipidemia  Home meds: Lipitor 40, resumed in hospital  LDL 75, goal < 70  Continue statin at discharge if ongoing aggressive care continued at that time  Diabetes type II Uncontrolled  HgbA1c 5.3, at goal < 7.0  Other Stroke Risk Factors  Advanced age  Former cigarette smoker  Hx stroke/TIA  Known stroke with resultant right hemiparesis.  Details not available in epic or care everywhere  Other Active Problems  Cervical cancer.  Chemo on hold for now. With rehab, hope for improvement and continuation of chemo in the future  anemia due to recent chemo without response to blood transfusion  Neutropenia, resolved after 1 dose of G-CSF 12/30/2018  Bilateral pneumonia with neutropenic fever.  COVID test was negative.  ? related to aspiration   NOTHING FURTHER TO ADD FROM THE STROKE STANDPOINT  Patient has a 10-15% risk of having another stroke over the next year, the highest risk is within 2 weeks of the most recent stroke/TIA (risk of having a stroke following a stroke or TIA is the same).  Ongoing risk factor control by Primary Care Physician  Stroke Service will sign off. Please call should any needs arise.  Follow-up Stroke Clinic at [facility] in 4 weeks, order placed.  Hospital day # 3  Patient has a large left MCA infarct with significant aphasia and dense hemiplegia and prognosis for recovery is poor given her advanced cancer and chemotherapy.  I had a long discussion with the patient's daughter at the bedside and where her granddaughter via face time and discuss prognosis and treatment options and answered questions.  Family is hopeful and want to give her a chance to recover.  They understand chemotherapy has to be on hold till she is show significant recovery which might not occur.  I did not recommend invasive evaluation with TEE or prolonged cardiac monitoring given her poor general medical status.  Continue aspirin and  physical occupational and speech therapy and rehab consults.  Discussed with Dr. Jiles Prows.  Greater than 50% time during this 25-minute visit was spent on counseling and coordination of care and discussion with care team and patient's family and answering questions  Antony Contras, [place] Pager: [phone] 01/01/2019 2:05 PM   To contact Stroke Continuity provider, please refer to [email or web address] After hours, contact General Neurology

## 2019-01-01 NOTE — Evaluation (Addendum)
Occupational Therapy Evaluation Patient Details Name: Katie Myers MRN: [id number] DOB: July 24, 1946 Today's Date: 01/01/2019    History of Present Illness Katie Myers is a 73 y.o. female with past medical history of stroke, neuropathy, knee pain, hypertension, mets to the lung and lymph nodes, chronic anemia, hyperlipidemia and diabetes.  Admitted 12/29/18 with SOB and fever secondary to PNA. Sustained a L MCA CVA while in the ED. Per MD notes, pt's prognosis is poor.   Clinical Impression   Pt with aphasia and daughter was not available by phone to offer information about pt's PLOF or home set up. Pt presents with dense, flaccid R hemiplegia, poor sitting and zero standing balance and is dependent in all ADL. She will need extensive rehab. Recommending SNF. Will follow acutely.    Follow Up Recommendations  SNF;Supervision/Assistance - 24 hour    Equipment Recommendations  Hospital bed;Wheelchair (measurements OT);Wheelchair cushion (measurements OT);3 in 1 bedside commode(if family chooses to take pt home)    Recommendations for Other Services       Precautions / Restrictions Precautions Precautions: Fall Precaution Comments: dense R hemi, aphasia Restrictions Weight Bearing Restrictions: No      Mobility Bed Mobility Overal bed mobility: Needs Assistance Bed Mobility: Rolling;Sidelying to Sit;Sit to Supine Rolling: Max assist Sidelying to sit: Max assist   Sit to supine: Max assist   General bed mobility comments: assist for all aspects  Transfers Overall transfer level: Needs assistance   Transfers: Sit to/from Stand Sit to Stand: Max assist Stand pivot transfers: Max assist       General transfer comment: transferred toward L side and back to bed    Balance Overall balance assessment: Needs assistance   Sitting balance-Leahy Scale: Poor       Standing balance-Leahy Scale: Zero                             ADL either performed or assessed  with clinical judgement   ADL                                         General ADL Comments: pt requiring total assist     Vision   Additional Comments: appears intact     Perception     Praxis      Pertinent Vitals/Pain Pain Assessment: Faces Faces Pain Scale: No hurt     Hand Dominance Right   Extremity/Trunk Assessment Upper Extremity Assessment Upper Extremity Assessment: RUE deficits/detail RUE Deficits / Details: dense R hemiplegia RUE Coordination: decreased fine motor;decreased gross motor       Cervical / Trunk Assessment Cervical / Trunk Assessment: Other exceptions Cervical / Trunk Exceptions: weakness   Communication Communication Communication: Receptive difficulties;Expressive difficulties(nods her head, but not always reliably)   Cognition Arousal/Alertness: Awake/alert Behavior During Therapy: Flat affect Overall Cognitive Status: Difficult to assess                                     General Comments       Exercises     Shoulder Instructions      Home Living Family/patient expects to be discharged to:: Private residence Living Arrangements: Children(daughter) Available Help at Discharge: Family  Additional Comments: pt nonverbal to unable to determine, call to daughter was unanswered, no room to leave a message      Prior Functioning/Environment                   OT Problem List: Decreased strength;Decreased activity tolerance;Impaired balance (sitting and/or standing);Decreased coordination;Impaired UE functional use;Decreased knowledge of use of DME or AE      OT Treatment/Interventions: Self-care/ADL training;DME and/or AE instruction;Patient/family education;Balance training;Therapeutic activities;Neuromuscular education    OT Goals(Current goals can be found in the care plan section) Acute Rehab OT Goals Patient Stated Goal: did not state, agreeable  to work with OT OT Goal Formulation: Patient unable to participate in goal setting Time For Goal Achievement: 01/15/19 Potential to Achieve Goals: Fair ADL Goals Pt Will Perform Grooming: with mod assist;sitting Additional ADL Goal #1: Pt will roll with moderate assistance for pericare and positioning. Additional ADL Goal #2: Pt will sit at EOB with moderate assistance in preparation for ADL. Additional ADL Goal #3: Pt will perform sit>stand with moderate assistance in preparation for ADL. Additional ADL Goal #4: Pt will locate R UE with her L to protect from injury with mobility and position in supine and sitting.  OT Frequency: Min 2X/week   Barriers to D/C:            Co-evaluation              AM-PAC OT "6 Clicks" Daily Activity     Outcome Measure Help from another person eating meals?: Total Help from another person taking care of personal grooming?: Total Help from another person toileting, which includes using toliet, bedpan, or urinal?: Total Help from another person bathing (including washing, rinsing, drying)?: Total Help from another person to put on and taking off regular upper body clothing?: Total Help from another person to put on and taking off regular lower body clothing?: Total 6 Click Score: 6   End of Session Equipment Utilized During Treatment: Gait belt  Activity Tolerance: Patient tolerated treatment well Patient left: in bed;with call bell/phone within reach;with bed alarm set  OT Visit Diagnosis: Unsteadiness on feet (R26.81);Hemiplegia and hemiparesis;Muscle weakness (generalized) (M62.81) Hemiplegia - Right/Left: Right Hemiplegia - dominant/non-dominant: Non-Dominant Hemiplegia - caused by: Cerebral infarction                Time: 3810-1751 OT Time Calculation (min): 15 min Charges:  OT General Charges $OT Visit: 1 Visit OT Evaluation $OT Eval Moderate Complexity: 1 Mod  Nestor Lewandowsky, OTR/L Acute Rehabilitation Services Pager:  [phone] Office: [phone] Malka So 01/01/2019, 10:23 AM

## 2019-01-02 DIAGNOSIS — D6481 Anemia due to antineoplastic chemotherapy: Secondary | ICD-10-CM

## 2019-01-02 LAB — CBC WITH DIFFERENTIAL/PLATELET
Abs Immature Granulocytes: 0.48 10*3/uL — ABNORMAL HIGH (ref 0.00–0.07)
Basophils Absolute: 0 10*3/uL (ref 0.0–0.1)
Basophils Relative: 0 %
Eosinophils Absolute: 0 10*3/uL (ref 0.0–0.5)
Eosinophils Relative: 0 %
HCT: 26.7 % — ABNORMAL LOW (ref 36.0–46.0)
Hemoglobin: 8.4 g/dL — ABNORMAL LOW (ref 12.0–15.0)
Immature Granulocytes: 3 %
Lymphocytes Relative: 15 %
Lymphs Abs: 2.4 10*3/uL (ref 0.7–4.0)
MCH: 29.2 pg (ref 26.0–34.0)
MCHC: 31.5 g/dL (ref 30.0–36.0)
MCV: 92.7 fL (ref 80.0–100.0)
Monocytes Absolute: 1.5 10*3/uL — ABNORMAL HIGH (ref 0.1–1.0)
Monocytes Relative: 10 %
Neutro Abs: 11.3 10*3/uL — ABNORMAL HIGH (ref 1.7–7.7)
Neutrophils Relative %: 72 %
Platelets: 393 10*3/uL (ref 150–400)
RBC: 2.88 MIL/uL — ABNORMAL LOW (ref 3.87–5.11)
RDW: 18.2 % — ABNORMAL HIGH (ref 11.5–15.5)
WBC: 15.7 10*3/uL — ABNORMAL HIGH (ref 4.0–10.5)
nRBC: 0 % (ref 0.0–0.2)

## 2019-01-02 LAB — RENAL FUNCTION PANEL
Albumin: 1.9 g/dL — ABNORMAL LOW (ref 3.5–5.0)
Anion gap: 6 (ref 5–15)
BUN: 11 mg/dL (ref 8–23)
CO2: 26 mmol/L (ref 22–32)
Calcium: 8.1 mg/dL — ABNORMAL LOW (ref 8.9–10.3)
Chloride: 101 mmol/L (ref 98–111)
Creatinine, Ser: 0.45 mg/dL (ref 0.44–1.00)
GFR calc Af Amer: >60 mL/min (ref >60)
GFR calc non Af Amer: >60 mL/min (ref >60)
Glucose, Bld: 147 mg/dL — ABNORMAL HIGH (ref 70–99)
Phosphorus: 2.6 mg/dL (ref 2.5–4.6)
Potassium: 3.3 mmol/L — ABNORMAL LOW (ref 3.5–5.1)
Sodium: 133 mmol/L — ABNORMAL LOW (ref 135–145)

## 2019-01-02 LAB — GLUCOSE, CAPILLARY
Glucose-Capillary: 112 mg/dL — ABNORMAL HIGH (ref 70–99)
Glucose-Capillary: 181 mg/dL — ABNORMAL HIGH (ref 70–99)
Glucose-Capillary: 163 mg/dL — ABNORMAL HIGH (ref 70–99)
Glucose-Capillary: 141 mg/dL — ABNORMAL HIGH (ref 70–99)

## 2019-01-02 LAB — URINE CULTURE: Culture: NO GROWTH

## 2019-01-02 MED ORDER — AMLODIPINE BESYLATE 10 MG PO TABS
10.0000 mg | ORAL_TABLET | Freq: Every day | ORAL | Status: DC
  Administered 2019-01-02 – 2019-01-06 (×5): 10 mg via ORAL
  Filled 2019-01-02 (×5): qty 1

## 2019-01-02 MED ORDER — BENAZEPRIL HCL 20 MG PO TABS
20.0000 mg | ORAL_TABLET | Freq: Every day | ORAL | Status: DC
  Administered 2019-01-02: 14:00:00 20 mg via ORAL
  Filled 2019-01-02: qty 1

## 2019-01-02 NOTE — Progress Notes (Signed)
  Speech Language Pathology Treatment: Dysphagia;Cognitive-Linquistic  Patient Details Name: Katie Myers MRN: [id number] DOB: 02/26/1946 Today's Date: 01/02/2019 Time: 3664-4034 SLP Time Calculation (min) (ACUTE ONLY): 14 min  Assessment / Plan / Recommendation Clinical Impression  Pt has anterior loss with larger sips of thin liquids but SLP provided Min tactile cues for smaller straw sips to reduce loss. No oral residue is present with purees, making posterior transit mildly prolonged but functional. Pt remains significantly aphasic, vocalizing along to melodies of familiar songs but without any clear words. During counting trials, she appeared to approximate "oo" for "two." She opened her mouth to command, otherwise needing consistent visual demonstrations to follow commands. She will need significant SLP f/u upon discharge and 24/7 supervision.   HPI HPI: 73 year old female presented hospital with shortness of breath and inability to express herself.  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  She usually is able to talk and converse. There are no prior SLP notes in chart      SLP Plan  Continue with current plan of care       Recommendations  Diet recommendations: Dysphagia 1 (puree);Thin liquid Liquids provided via: Straw;Cup Medication Administration: Crushed with puree Supervision: Staff to assist with self feeding;Full supervision/cueing for compensatory strategies Compensations: Minimize environmental distractions;Monitor for anterior loss;Lingual sweep for clearance of pocketing;Follow solids with liquid Postural Changes and/or Swallow Maneuvers: Seated upright 90 degrees                Oral Care Recommendations: Oral care BID Follow up Recommendations:  Inpatient Rehab SLP Visit Diagnosis: Aphasia (R47.01);Dysphagia, oral phase (R13.11) Plan: Continue with current plan of care       Katie Myers 01/02/2019, 4:43 PM  Pollyann Glen, M.A. [place] Acute Environmental education officer [phone] Office [phone]

## 2019-01-02 NOTE — Progress Notes (Signed)
Marland Kitchen  PROGRESS NOTE    Katie Myers  VFI:[id number] DOB: [birth year] DOA: 12/29/2018 PCP: [name], MD   Brief Narrative:   Patient is a 73 year old female with history of diabetes type 2, cervical cancer with mets to lungs and lymph node, ischemic stroke, chronic anemia, hypertension who was brought to the emergency department yesterday with complaints of shortness of breath,fever. She was being admitted for pneumonia and while in the emergency department shebecame confused, developed slurred speech and right-sided weakness. MRI of the brain showed acute cortical infarction of anterior left middle cerebral artery territory. She has been started on IV antibiotics for pneumonia. Neurology has been consulted and she has been transferred to [facility] today. She is also being followed by oncology.   Assessment & Plan:   Active Problems:   Cervical cancer ([place])   HCAP (healthcare-associated pneumonia)   Pressure injury of skin   Acute ischemic stroke ([place])   1. Acute ischemic stroke:  - MRI: Acute cortical infarct of the anterior left middle cerebral artery territory. No hemorrhage or mass effect.  - Patient is dysarthric, has slurred speech. She is alert. 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 - Neurology consulted; appreciate assistance. - Spoke with neurology; rec comfort care and no further stroke w/u  2. Neutropenic fever:  - Febrile on presentation.  - Suspected secondary to pneumonia. Follow-up cultures. Afebrile this morning.  - Oncology following     - she remains afebrile; source was believed to be PNA; looks like she has a dirty UA as well; WBCs don't make sense this AM, repeat labs; will likely deescalate to levaquin in AM  3. Possible healthcare associated pneumonia:  - Immunocompromised patient.  - Chest x-ray finding consistent with infiltrates.  - Continue current antibiotics.  - Covid-19  negative.     - cefepime     - will likely de-escalate to levaquin in AM  4. Hypertension:  - Antihypertensives on hold.  - adding back BP meds slowly; amlodipine/benazepril today  5. Hyponatremia:  - Mild. Monitor BMP  6. History of metastatic cervical cancer - Cervical cancer with mets to lymph nodes and lung.  - per Dr. Alvy Bimler: I will sign off and call her niece a month from now for update.  If she has no meaningful improvement, then I think she should be enrolled in hospice  7. Type 2 diabetes mellitus - Continue sliding scale insulin.  - On metformin at home.  8. Hyperlipidemia - On Lipitor at home.  9. Hypokalemia - replete as necessary  10. Chronic normocytic anemia - Most likely secondary to malignancy, chemotherapy.  - s/p 1 unit of pRBCs as per oncology.  CBC doesn't make sense this AM. Check repeat. Will likely de-escalate to levaquin in AM. Onco note reviewed. Will speak w/ CM about our dispo options.   DVT prophylaxis: Lovenox Code Status: FULL Family Communication: None   Disposition Plan: TBD   Consultants:   Neurology  Oncology  Antimicrobials:  . Cefepime    Subjective: No acute events ON. Denies complaints this AM  Objective: Vitals:   01/02/19 0808 01/02/19 1129 01/02/19 1338 01/02/19 1601  BP: (!) 172/71 (!) 172/63 (!) 172/72 (!) 153/60  Pulse: 87 90 86 87  Resp: 18 18  16   Temp: 98.4 F (36.9 C) 98.1 F (36.7 C)  98.4 F (36.9 C)  TempSrc: Oral Oral  Oral  SpO2: 100% 100%  100%  Weight:      Height:  Intake/Output Summary (Last 24 hours) at 01/02/2019 1652 Last data filed at 01/02/2019 1620 Gross per 24 hour  Intake 3247.82 ml  Output 1300 ml  Net 1947.82 ml   Filed Weights   12/29/18 1331  Weight: 62.6 kg    Examination:  General:72 y.o.femaleresting in bed in NAD, seems a little frustrated this AM Cardiovascular: RRR, +S1, S2, no m/g/r, equal pulses  throughout Respiratory:Upper airway transmission,soft rhonchi noted b/l, normal WOB GI: BS+, NDNT, no masses noted, no organomegaly noted MSK: No e/c/c Skin: No rashes, bruises, ulcerations noted Neuro: expressive aphasic, follows some commands, right hemiplegia      Data Reviewed: I have personally reviewed following labs and imaging studies.  CBC: Recent Labs  Lab 12/29/18 1336 12/30/18 0639 12/31/18 0436 01/02/19 0620  WBC 0.6* 2.7* 8.6 15.7*  NEUTROABS 0.4* 0.6* 6.9 11.3*  HGB 8.9* 7.1* 8.5* 8.4*  HCT 27.7* 22.2* 26.0* 26.7*  MCV 95.8 96.9 92.5 92.7  PLT 281 258 298 616   Basic Metabolic Panel: Recent Labs  Lab 12/29/18 1336 12/30/18 0639 12/31/18 0436 01/01/19 0417 01/02/19 0620  NA 129* 129* 132*  --  133*  K 3.8 3.4* 3.6  --  3.3*  CL 93* 98 99  --  101  CO2 25 26 23   --  26  GLUCOSE 188* 87 71  --  147*  BUN 6* 8 5*  --  11  CREATININE 0.60 0.46 0.56 0.50 0.45  CALCIUM 8.7* 7.9* 8.3*  --  8.1*  PHOS  --   --   --   --  2.6   GFR: Estimated Creatinine Clearance: 56.4 mL/min (by C-G formula based on SCr of 0.45 mg/dL). Liver Function Tests: Recent Labs  Lab 12/29/18 1336 12/30/18 0639 01/02/19 0620  AST 30 23  --   ALT 20 16  --   ALKPHOS 85 61  --   BILITOT 1.2 1.1  --   PROT 8.2* 6.5  --   ALBUMIN 3.0* 2.4* 1.9*   No results for input(s): LIPASE, AMYLASE in the last 168 hours. No results for input(s): AMMONIA in the last 168 hours. Coagulation Profile: No results for input(s): INR, PROTIME in the last 168 hours. Cardiac Enzymes: No results for input(s): CKTOTAL, CKMB, CKMBINDEX, TROPONINI in the last 168 hours. BNP (last 3 results) No results for input(s): PROBNP in the last 8760 hours. HbA1C: Recent Labs    12/31/18 0436  HGBA1C 5.3   CBG: Recent Labs  Lab 01/01/19 1127 01/01/19 1634 01/01/19 2158 01/02/19 0605 01/02/19 1148  GLUCAP 143* 154* 149* 112* 181*   Lipid Profile: Recent Labs    12/31/18 0436  CHOL 121  HDL 31*   LDLCALC 75  TRIG 75  CHOLHDL 3.9   Thyroid Function Tests: No results for input(s): TSH, T4TOTAL, FREET4, T3FREE, THYROIDAB in the last 72 hours. Anemia Panel: No results for input(s): VITAMINB12, FOLATE, FERRITIN, TIBC, IRON, RETICCTPCT in the last 72 hours. Sepsis Labs: Recent Labs  Lab 12/29/18 1336  LATICACIDVEN 1.7    Recent Results (from the past 240 hour(s))  Blood Culture (routine x 2)     Status: None (Preliminary result)   Collection Time: 12/29/18  1:36 PM  Result Value Ref Range Status   Specimen Description   Final    BLOOD LEFT ANTECUBITAL Performed at [facility], [place] [address] [place], [place] [address]    Special Requests   Final    BOTTLES DRAWN AEROBIC AND ANAEROBIC Blood Culture adequate  volume Performed at [facility], [place] [address] [place], [place] [address]    Culture   Final    NO GROWTH 4 DAYS Performed at [facility], [place] [address], [place], [place] [address]    Report Status PENDING  Incomplete  Blood Culture (routine x 2)     Status: None (Preliminary result)   Collection Time: 12/29/18  1:52 PM  Result Value Ref Range Status   Specimen Description   Final    BLOOD RIGHT ANTECUBITAL Performed at [facility], [place] [address] [place], [place] [address]    Special Requests   Final    BOTTLES DRAWN AEROBIC AND ANAEROBIC Blood Culture results may not be optimal due to an excessive volume of blood received in culture bottles Performed at [place] [address], [place], [place] [address]    Culture   Final    NO GROWTH 4 DAYS Performed at [facility], [place] [address], [place], [place] [address]    Report Status PENDING  Incomplete  SARS Coronavirus 2 (CEPHEID- Performed in [place] hospital lab), Hosp Order     Status: None   Collection Time: 12/29/18  2:40 PM  Result Value Ref Range Status   SARS Coronavirus 2 NEGATIVE NEGATIVE Final    Comment: (NOTE) If  result is NEGATIVE SARS-CoV-2 target nucleic acids are NOT DETECTED. The SARS-CoV-2 RNA is generally detectable in upper and lower  respiratory specimens during the acute phase of infection. The lowest  concentration of SARS-CoV-2 viral copies this assay can detect is 250  copies / mL. A negative result does not preclude SARS-CoV-2 infection  and should not be used as the sole basis for treatment or other  patient management decisions.  A negative result may occur with  improper specimen collection / handling, submission of specimen other  than nasopharyngeal swab, presence of viral mutation(s) within the  areas targeted by this assay, and inadequate number of viral copies  (<250 copies / mL). A negative result must be combined with clinical  observations, patient history, and epidemiological information. If result is POSITIVE SARS-CoV-2 target nucleic acids are DETECTED. The SARS-CoV-2 RNA is generally detectable in upper and lower  respiratory specimens dur ing the acute phase of infection.  Positive  results are indicative of active infection with SARS-CoV-2.  Clinical  correlation with patient history and other diagnostic information is  necessary to determine patient infection status.  Positive results do  not rule out bacterial infection or co-infection with other viruses. If result is PRESUMPTIVE POSTIVE SARS-CoV-2 nucleic acids MAY BE PRESENT.   A presumptive positive result was obtained on the submitted specimen  and confirmed on repeat testing.  While 2019 novel coronavirus  (SARS-CoV-2) nucleic acids may be present in the submitted sample  additional confirmatory testing may be necessary for epidemiological  and / or clinical management purposes  to differentiate between  SARS-CoV-2 and other Sarbecovirus currently known to infect humans.  If clinically indicated additional testing with an alternate test  methodology [phone]) is advised. The SARS-CoV-2 RNA is generally   detectable in upper and lower respiratory sp ecimens during the acute  phase of infection. The expected result is Negative. Fact Sheet for Patients:  StrictlyIdeas.no Fact Sheet for Healthcare Providers: BankingDealers.co.za This test is not yet approved or cleared by the [place] FDA and has been authorized for detection and/or diagnosis of SARS-CoV-2 by FDA under an Emergency Use Authorization (EUA).  This EUA will remain in effect (meaning this test can be used) for the duration of the COVID-19 declaration under Section 564(b)(1) of the Act, 21 U.S.C. section 360bbb-3(b)(1), unless the authorization is terminated or revoked sooner. Performed at [facility], [place] [address], [place], [place] [address]   MRSA PCR Screening     Status: None   Collection Time: 12/29/18  5:23 PM  Result Value Ref Range Status   MRSA by PCR NEGATIVE NEGATIVE Final    Comment:        The GeneXpert MRSA Assay (FDA approved for NASAL specimens only), is one component of a comprehensive MRSA colonization surveillance program. It is not intended to diagnose MRSA infection nor to guide or monitor treatment for MRSA infections. Performed at [facility], [place] [address] [place], [place] [address]   Urine Culture     Status: None   Collection Time: 01/01/19  8:57 AM  Result Value Ref Range Status   Specimen Description URINE, CLEAN CATCH  Final   Special Requests NONE  Final   Culture   Final    NO GROWTH Performed at [facility], [address] [place], [place] [address]    Report Status 01/02/2019 FINAL  Final         Radiology Studies: No results found.      Scheduled Meds: . sodium chloride   Intravenous Once  . acetaminophen  650 mg Oral Once  . amLODipine  10 mg Oral Daily  . aspirin EC  81 mg Oral Daily  . atorvastatin  40 mg Oral Daily  . benazepril  20 mg Oral Daily  .  clopidogrel  75 mg Oral Daily  . diphenhydrAMINE  25 mg Intravenous Once  . enoxaparin (LOVENOX) injection  40 mg Subcutaneous Q24H  . feeding supplement (ENSURE ENLIVE)  237 mL Oral BID BM  . insulin aspart  0-5 Units Subcutaneous QHS  . insulin aspart  0-9 Units Subcutaneous TID WC  . sodium chloride flush  10-40 mL Intracatheter Q12H   Continuous Infusions: . sodium chloride 75 mL/hr at 01/02/19 1620  . ceFEPime (MAXIPIME) IV Stopped (01/02/19 0933)     LOS: 4 days    Time spent: 25 minutes spent in the coordination of care today.    Jonnie Finner, DO [facility] Pager [phone]  If 7PM-7AM, please contact night-coverage [email or web address] Password [facility] 01/02/2019, 4:52 PM

## 2019-01-02 NOTE — Progress Notes (Signed)
Katie Myers   DOB:1945-12-09   UU#:[id number]    ASSESSMENT & PLAN:  Acute stroke She is a bit better today, more alert and able to follow simple commands.  I have reviewed recommendation by neurologist, speech and language therapist and physical therapist. Hopefully, she can regain some function with aggressive rehabilitation. I agree with CIR if she is a candidate, if not, other form of rehabilitation would be helpful  Poorly controlled diabetes She will continue insulin treatment as needed  Anemia Due to recent chemo. She has responded to blood transfusion. She does not need further transfusion support for now  Neutropenia, resolved Her elevated WBC is likely due to recent G-CSF support  Cervical cancer [facility]) The patient was seen byGYN oncologist on 12/25/2018  Recommendation was for a repeat PET scan prior to cycle #4, however, will put everything on hold for now Due to significant neurological deficit, it is unlikely she can benefit for future chemotherapy if she has no further improvement of her neurological deficit. I am hopeful, with aggressive rehabilitation, she might regain function to the point that she can benefit from future chemotherapy.  Bilateral pneumonia, neutropenic fever She is currently on broad-spectrum IV antibiotics COVID 19 test was negative It is not clear whether the pneumonia could be triggered by aspiration. I would defer to primary service about duration of antibiotics treatment  Goals of care Overall, prognosis is poor She has extensive stage cervical cancer and treatment goal was strictly palliative in nature If she does not recover or retain reasonable function after her acute stroke, she will not be able to proceed with further chemotherapy I have updated her next of kin, [name], her niece today and have addressed all her questions. For now, I recommend aggressive supportive care and CIR if she is a candidate I will sign off and call her  niece a month from now for update.  If she has no meaningful improvement, then I think she should be enrolled in hospice   Heath Lark, MD 01/02/2019 1:55 PM  Subjective:  She appears alert.  She remain a phasic with right-sided weakness.  She appears to be able to move her left upper limbs and lower limbs without difficulties.  She has been afebrile.  Objective:  Vitals:   01/02/19 1129 01/02/19 1338  BP: (!) 172/63 (!) 172/72  Pulse: 90 86  Resp: 18   Temp: 98.1 F (36.7 C)   SpO2: 100%      Intake/Output Summary (Last 24 hours) at 01/02/2019 1355 Last data filed at 01/02/2019 0900 Gross per 24 hour  Intake 250 ml  Output 1300 ml  Net -1050 ml    GENERAL:alert, no distress and comfortable NEURO: alert with persistent right-sided deficit  Labs:  Recent Labs    12/18/18 1118  12/29/18 1336 12/30/18 0639 12/31/18 0436 01/01/19 0417 01/02/19 0620  NA 136  --  129* 129* 132*  --  133*  K 3.8  --  3.8 3.4* 3.6  --  3.3*  CL 98  --  93* 98 99  --  101  CO2 27  --  25 26 23   --  26  GLUCOSE 194*  --  188* 87 71  --  147*  BUN 9  --  6* 8 5*  --  11  CREATININE 0.70   < > 0.60 0.46 0.56 0.50 0.45  CALCIUM 8.7*  --  8.7* 7.9* 8.3*  --  8.1*  GFRNONAA >60   < > >60 >  60 >60 >60 >60  GFRAA >60   < > >60 >60 >60 >60 >60  PROT 8.2*  --  8.2* 6.5  --   --   --   ALBUMIN 2.9*  --  3.0* 2.4*  --   --  1.9*  AST 31  --  30 23  --   --   --   ALT 17  --  20 16  --   --   --   ALKPHOS 89  --  85 61  --   --   --   BILITOT 0.4  --  1.2 1.1  --   --   --    < > = values in this interval not displayed.    Studies:  Ct Head Wo Contrast  Result Date: 12/29/2018 CLINICAL DATA:  Altered level of consciousness. History of cervical cancer. EXAM: CT HEAD WITHOUT CONTRAST TECHNIQUE: Contiguous axial images were obtained from the base of the skull through the vertex without intravenous contrast. COMPARISON:  None. FINDINGS: Brain: Mild atrophy. Patchy white matter hypodensity bilaterally  appears chronic. No acute infarct, hemorrhage, or mass lesion. Vascular: Atherosclerotic calcification in the carotid and vertebral arteries. Skull: Negative Sinuses/Orbits: Negative Other: None IMPRESSION: No acute abnormality. Mild atrophy and mild chronic microvascular ischemic type changes in the white matter. Electronically Signed   By: Franchot Gallo M.D.   On: 12/29/2018 16:37   Mr Jeri Cos SA Contrast  Result Date: 12/29/2018 CLINICAL DATA:  Ataxia EXAM: MRI HEAD WITHOUT AND WITH CONTRAST TECHNIQUE: Multiplanar, multiecho pulse sequences of the brain and surrounding structures were obtained without and with intravenous contrast. CONTRAST:  6 mL Gadavist COMPARISON:  Head CT 12/29/2018 FINDINGS: Examination is severely degraded by motion. BRAIN: Abnormal diffusion restriction along the cortex of the left anterior MCA territory. The midline structures are normal. No midline shift or other mass effect. Multifocal white matter hyperintensity, most commonly due to chronic ischemic microangiopathy. Generalized atrophy without lobar predilection. Susceptibility-sensitive sequences show no chronic microhemorrhage or superficial siderosis. No abnormal contrast enhancement. VASCULAR: The major intracranial arterial and venous sinus flow voids are normal. SKULL AND UPPER CERVICAL SPINE: Calvarial bone marrow signal is normal. There is no skull base mass. Visualized upper cervical spine and soft tissues are normal. SINUSES/ORBITS: No fluid levels or advanced mucosal thickening. No mastoid or middle ear effusion. The orbits are normal. IMPRESSION: 1. 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 No hemorrhage or mass effect. 2. Chronic ischemic microvascular disease. 3. Severely motion degraded study. Electronically Signed   By: Ulyses Jarred M.D.   On: 12/29/2018 19:42   Dg Chest Portable 1 View  Result Date: 12/29/2018 CLINICAL DATA:  Hypoxia, fever. EXAM: PORTABLE CHEST 1 VIEW  COMPARISON:  None. FINDINGS: The heart size and mediastinal contours are within normal limits. Atherosclerosis of thoracic aorta is noted. Right internal jugular Port-A-Cath is unchanged in position. No pneumothorax is noted. Mild bilateral perihilar and basilar opacities are noted concerning for edema or possibly inflammation. Small pleural effusions may be present. The visualized skeletal structures are unremarkable. IMPRESSION: Mild bilateral perihilar and basilar opacities are noted concerning for edema or possibly inflammation. Small pleural effusions. Aortic Atherosclerosis (ICD10-I70.0). Electronically Signed   By: Marijo Conception M.D.   On: 12/29/2018 14:23   Dg Swallowing Func-speech Pathology  Result Date: 12/31/2018 Objective Swallowing Evaluation: Type of Study: MBS-Modified Barium Swallow Study  Patient Details Name: EVETT KASSA MRN: [id number] Date of Birth: Jun 05, 1946  Today's Date: 12/31/2018 Time: SLP Start Time (ACUTE ONLY): 1300 -SLP Stop Time (ACUTE ONLY): 1420 SLP Time Calculation (min) (ACUTE ONLY): 80 min Past Medical History: Past Medical History: Diagnosis Date . Cancer (HCC)   cervical cancer . Diabetes mellitus without complication ([place])  . Hyperlipidemia  . Hypertension  . Knee pain, bilateral  . Neuropathy  . Stroke ([place])  . Underweight  Past Surgical History: Past Surgical History: Procedure Laterality Date . IR IMAGING GUIDED PORT INSERTION  11/05/2018 HPI: 73 year old female presented hospital with shortness of breath and inability to express herself.  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  She usually is able to talk and converse. There are no prior SLP notes in chart  No data recorded Assessment / Plan / Recommendation CHL IP CLINICAL IMPRESSIONS 12/31/2018 Clinical  Impression Pt demonstrates a mild to moderate oral dysphagia due to missing dentition and right CN VII weakenss and sensory deficit. There is prolonged mastication with oral residual. There is intermittent anterior spillage with most bites and sips. Occasional premature spillage also occurs, but no penetration or aspiration was observed. Will initiate a dysphagia 1 (puree) and thin liquid diet With postential to advance solids if tolerated at bedside without too much struggle or pocketing.  SLP Visit Diagnosis Dysphagia, oral phase (R13.11) Attention and concentration deficit following -- Frontal lobe and executive function deficit following -- Impact on safety and function --   CHL IP TREATMENT RECOMMENDATION 12/31/2018 Treatment Recommendations Defer until completion of intrumental exam   No flowsheet data found. CHL IP DIET RECOMMENDATION 12/31/2018 SLP Diet Recommendations Dysphagia 1 (Puree) solids;Thin liquid Liquid Administration via Cup;Straw Medication Administration Whole meds with puree Compensations Minimize environmental distractions;Monitor for anterior loss;Lingual sweep for clearance of pocketing;Follow solids with liquid Postural Changes Remain semi-upright after after feeds/meals (Comment)   CHL IP OTHER RECOMMENDATIONS 12/31/2018 Recommended Consults -- Oral Care Recommendations Oral care BID Other Recommendations --   CHL IP FOLLOW UP RECOMMENDATIONS 12/31/2018 Follow up Recommendations 24 hour supervision/assistance   CHL IP FREQUENCY AND DURATION 12/31/2018 Speech Therapy Frequency (ACUTE ONLY) min 2x/week Treatment Duration --      CHL IP ORAL PHASE 12/31/2018 Oral Phase Impaired Oral - Pudding Teaspoon -- Oral - Pudding Cup -- Oral - Honey Teaspoon -- Oral - Honey Cup -- Oral - Nectar Teaspoon -- Oral - Nectar Cup Right anterior bolus loss;Right pocketing in lateral sulci Oral - Nectar Straw Right anterior bolus loss Oral - Thin Teaspoon -- Oral - Thin Cup Right anterior bolus loss Oral - Thin  Straw Right anterior bolus loss Oral - Puree Right pocketing in lateral sulci Oral - Mech Soft Right pocketing in lateral sulci;Right anterior bolus loss Oral - Regular -- Oral - Multi-Consistency -- Oral - Pill -- Oral Phase - Comment --  CHL IP PHARYNGEAL PHASE 12/31/2018 Pharyngeal Phase WFL Pharyngeal- Pudding Teaspoon -- Pharyngeal -- Pharyngeal- Pudding Cup -- Pharyngeal -- Pharyngeal- Honey Teaspoon -- Pharyngeal -- Pharyngeal- Honey Cup -- Pharyngeal -- Pharyngeal- Nectar Teaspoon -- Pharyngeal -- Pharyngeal- Nectar Cup -- Pharyngeal -- Pharyngeal- Nectar Straw -- Pharyngeal -- Pharyngeal- Thin Teaspoon -- Pharyngeal -- Pharyngeal- Thin Cup -- Pharyngeal -- Pharyngeal- Thin Straw -- Pharyngeal -- Pharyngeal- Puree -- Pharyngeal -- Pharyngeal- Mechanical Soft -- Pharyngeal -- Pharyngeal- Regular -- Pharyngeal -- Pharyngeal- Multi-consistency --  Pharyngeal -- Pharyngeal- Pill -- Pharyngeal -- Pharyngeal Comment --  No flowsheet data found. Herbie Baltimore, MA CCC-SLP Acute Rehabilitation Services Pager [phone] Office [phone] Lynann Beaver 12/31/2018, 2:08 PM              Vas US Carotid  Result Date: 12/30/2018 Carotid Arterial Duplex Study Indications: TIA. Performing Technologist: Oliver Hum RVT  Examination Guidelines: A complete evaluation includes B-mode imaging, spectral Doppler, color Doppler, and power Doppler as needed of all accessible portions of each vessel. Bilateral testing is considered an integral part of a complete examination. Limited examinations for reoccurring indications may be performed as noted.  Right Carotid Findings: +----------+--------+--------+--------+-----------------------+--------+           PSV cm/sEDV cm/sStenosisDescribe               Comments +----------+--------+--------+--------+-----------------------+--------+ CCA Prox  62      8               smooth and heterogenous          +----------+--------+--------+--------+-----------------------+--------+ CCA Distal68      16              smooth and heterogenous         +----------+--------+--------+--------+-----------------------+--------+ ICA Prox  122     47              smooth and heterogenoustortuous +----------+--------+--------+--------+-----------------------+--------+ ICA Distal62      21                                     tortuous +----------+--------+--------+--------+-----------------------+--------+ ECA       115     9                                               +----------+--------+--------+--------+-----------------------+--------+ +----------+--------+-------+--------+-------------------+           PSV cm/sEDV cmsDescribeArm Pressure (mmHG) +----------+--------+-------+--------+-------------------+ [id number]                                        +----------+--------+-------+--------+-------------------+ +---------+--------+--+--------+--+---------+ VertebralPSV cm/s60EDV cm/s17Antegrade +---------+--------+--+--------+--+---------+  Left Carotid Findings: +----------+--------+-------+--------+--------------------------------+--------+           PSV cm/sEDV    StenosisDescribe                        Comments                   cm/s                                                    +----------+--------+-------+--------+--------------------------------+--------+ CCA Prox  80      13             smooth and heterogenous                  +----------+--------+-------+--------+--------------------------------+--------+ CCA Distal48      10             smooth and heterogenous                  +----------+--------+-------+--------+--------------------------------+--------+  ICA Prox  74      13             smooth, heterogenous and                                                  calcific                                  +----------+--------+-------+--------+--------------------------------+--------+ ICA Distal41      16                                             tortuous +----------+--------+-------+--------+--------------------------------+--------+ ECA       179     20                                                      +----------+--------+-------+--------+--------------------------------+--------+ +----------+--------+--------+--------+-------------------+ SubclavianPSV cm/sEDV cm/sDescribeArm Pressure (mmHG) +----------+--------+--------+--------+-------------------+           158                                         +----------+--------+--------+--------+-------------------+ +---------+--------+--+--------+--+---------+ VertebralPSV cm/s60EDV cm/s22Antegrade +---------+--------+--+--------+--+---------+  Summary: Right Carotid: Velocities in the right ICA are consistent with a 40-59%                stenosis. Left Carotid: Velocities in the left ICA are consistent with a 1-39% stenosis. Vertebrals: Bilateral vertebral arteries demonstrate antegrade flow. *See table(s) above for measurements and observations.  Electronically signed by Antony Contras MD on 12/30/2018 at 12:25:45 PM.    Final    Vas [place] Transcranial Doppler  Result Date: 01/01/2019  Transcranial Doppler Indications: Stroke. Limitations for diagnostic windows: Unable to insonate right transtemporal window. Unable to insonate left transtemporal window. Performing Technologist: June Leap RDMS, RVT  Examination Guidelines: A complete evaluation includes B-mode imaging, spectral Doppler, color Doppler, and power Doppler as needed of all accessible portions of each vessel. Bilateral testing is considered an integral part of a complete examination. Limited examinations for reoccurring indications may be performed as noted.  +----------+-------------+----------+-----------+------------------------------+ RIGHT TCD Right VM  (cm)Depth (cm)Pulsatility           Comment             +----------+-------------+----------+-----------+------------------------------+ Opthalmic     12.00                 1.52                                   +----------+-------------+----------+-----------+------------------------------+ ICA siphon   148.00                 1.43      difficult to obtain due to  patient movement        +----------+-------------+----------+-----------+------------------------------+ Vertebral    -61.00                 1.61                                   +----------+-------------+----------+-----------+------------------------------+  +----------+------------+----------+-----------+-----------------------------+ LEFT TCD  Left VM (cm)Depth (cm)Pulsatility           Comment            +----------+------------+----------+-----------+-----------------------------+ Opthalmic    65.00                         Possible retrograde opthalmic +----------+------------+----------+-----------+-----------------------------+ ICA siphon   100.00                  1                                   +----------+------------+----------+-----------+-----------------------------+ Vertebral    -42.00                0.98                                  +----------+------------+----------+-----------+-----------------------------+  +------------+-------+-------+             VM cm/sComment +------------+-------+-------+ Prox Basilar-78.00         +------------+-------+-------+ Dist Basilar-60.00         +------------+-------+-------+ Summary:  Absent bitemporal windowslimits evaluation of anterior circulation.Mildly elevated bilateral carotid siphon,vertebrals and basilar artery mean flow velocities of unclear significance *See table(s) above for measurements and observations.  Diagnosing physician: Antony Contras MD Electronically signed by  Antony Contras MD on 01/01/2019 at 8:10:49 AM.    Final

## 2019-01-03 ENCOUNTER — Inpatient Hospital Stay (HOSPITAL_COMMUNITY): Payer: Medicare HMO

## 2019-01-03 DIAGNOSIS — E876 Hypokalemia: Secondary | ICD-10-CM

## 2019-01-03 LAB — TYPE AND SCREEN
ABO/RH(D): O POS
Antibody Screen: POSITIVE
DAT, IgG: POSITIVE
Unit division: 0
Unit division: 0

## 2019-01-03 LAB — CULTURE, BLOOD (ROUTINE X 2)
Culture: NO GROWTH
Culture: NO GROWTH
Special Requests: ADEQUATE

## 2019-01-03 LAB — GLUCOSE, CAPILLARY
Glucose-Capillary: 122 mg/dL — ABNORMAL HIGH (ref 70–99)
Glucose-Capillary: 161 mg/dL — ABNORMAL HIGH (ref 70–99)
Glucose-Capillary: 193 mg/dL — ABNORMAL HIGH (ref 70–99)
Glucose-Capillary: 261 mg/dL — ABNORMAL HIGH (ref 70–99)

## 2019-01-03 LAB — RENAL FUNCTION PANEL
Albumin: 1.8 g/dL — ABNORMAL LOW (ref 3.5–5.0)
Anion gap: 8 (ref 5–15)
BUN: 11 mg/dL (ref 8–23)
CO2: 25 mmol/L (ref 22–32)
Calcium: 8.3 mg/dL — ABNORMAL LOW (ref 8.9–10.3)
Chloride: 101 mmol/L (ref 98–111)
Creatinine, Ser: 0.39 mg/dL — ABNORMAL LOW (ref 0.44–1.00)
GFR calc Af Amer: >60 mL/min (ref >60)
GFR calc non Af Amer: >60 mL/min (ref >60)
Glucose, Bld: 119 mg/dL — ABNORMAL HIGH (ref 70–99)
Phosphorus: 2.7 mg/dL (ref 2.5–4.6)
Potassium: 3.1 mmol/L — ABNORMAL LOW (ref 3.5–5.1)
Sodium: 134 mmol/L — ABNORMAL LOW (ref 135–145)

## 2019-01-03 LAB — CBC WITH DIFFERENTIAL/PLATELET
Abs Immature Granulocytes: 0.25 10*3/uL — ABNORMAL HIGH (ref 0.00–0.07)
Basophils Absolute: 0 10*3/uL (ref 0.0–0.1)
Basophils Relative: 0 %
Eosinophils Absolute: 0.1 10*3/uL (ref 0.0–0.5)
Eosinophils Relative: 0 %
HCT: 25.4 % — ABNORMAL LOW (ref 36.0–46.0)
Hemoglobin: 8.1 g/dL — ABNORMAL LOW (ref 12.0–15.0)
Immature Granulocytes: 2 %
Lymphocytes Relative: 17 %
Lymphs Abs: 2.3 10*3/uL (ref 0.7–4.0)
MCH: 30 pg (ref 26.0–34.0)
MCHC: 31.9 g/dL (ref 30.0–36.0)
MCV: 94.1 fL (ref 80.0–100.0)
Monocytes Absolute: 1.6 10*3/uL — ABNORMAL HIGH (ref 0.1–1.0)
Monocytes Relative: 12 %
Neutro Abs: 9.1 10*3/uL — ABNORMAL HIGH (ref 1.7–7.7)
Neutrophils Relative %: 69 %
Platelets: 389 10*3/uL (ref 150–400)
RBC: 2.7 MIL/uL — ABNORMAL LOW (ref 3.87–5.11)
RDW: 17.9 % — ABNORMAL HIGH (ref 11.5–15.5)
WBC: 13.3 10*3/uL — ABNORMAL HIGH (ref 4.0–10.5)
nRBC: 0 % (ref 0.0–0.2)

## 2019-01-03 LAB — BPAM RBC
Blood Product Expiration Date: 202005272359
Blood Product Expiration Date: 202005292359
ISSUE DATE / TIME: 202005122301
Unit Type and Rh: 5100
Unit Type and Rh: 5100

## 2019-01-03 LAB — CREATININE, SERUM
Creatinine, Ser: 0.41 mg/dL — ABNORMAL LOW (ref 0.44–1.00)
GFR calc Af Amer: >60 mL/min (ref >60)
GFR calc non Af Amer: >60 mL/min (ref >60)

## 2019-01-03 LAB — MAGNESIUM: Magnesium: 1.2 mg/dL — ABNORMAL LOW (ref 1.7–2.4)

## 2019-01-03 MED ORDER — CARVEDILOL 12.5 MG PO TABS
12.5000 mg | ORAL_TABLET | Freq: Two times a day (BID) | ORAL | Status: DC
  Administered 2019-01-03 – 2019-01-06 (×7): 12.5 mg via ORAL
  Filled 2019-01-03 (×7): qty 1

## 2019-01-03 MED ORDER — BENAZEPRIL HCL 20 MG PO TABS
40.0000 mg | ORAL_TABLET | Freq: Every day | ORAL | Status: DC
  Administered 2019-01-03 – 2019-01-06 (×4): 40 mg via ORAL
  Filled 2019-01-03 (×4): qty 2

## 2019-01-03 MED ORDER — MAGNESIUM SULFATE 2 GM/50ML IV SOLN
2.0000 g | Freq: Once | INTRAVENOUS | Status: AC
  Administered 2019-01-03: 08:00:00 2 g via INTRAVENOUS
  Filled 2019-01-03: qty 50

## 2019-01-03 MED ORDER — BISACODYL 10 MG RE SUPP
10.0000 mg | Freq: Once | RECTAL | Status: AC
  Administered 2019-01-03: 15:00:00 10 mg via RECTAL
  Filled 2019-01-03: qty 1

## 2019-01-03 MED ORDER — POTASSIUM CHLORIDE 10 MEQ/100ML IV SOLN
10.0000 meq | INTRAVENOUS | Status: AC
  Administered 2019-01-03 (×3): 10 meq via INTRAVENOUS
  Filled 2019-01-03: qty 100

## 2019-01-03 MED ORDER — POTASSIUM CHLORIDE 10 MEQ/100ML IV SOLN
INTRAVENOUS | Status: AC
  Filled 2019-01-03: qty 200

## 2019-01-03 MED ORDER — BENAZEPRIL HCL 20 MG PO TABS: 20.0000 mg | ORAL_TABLET | Freq: Once | ORAL | Status: DC

## 2019-01-03 MED ORDER — POTASSIUM CHLORIDE 10 MEQ/100ML IV SOLN
10.0000 meq | INTRAVENOUS | Status: AC
  Administered 2019-01-03 (×2): 10 meq via INTRAVENOUS
  Filled 2019-01-03 (×2): qty 100

## 2019-01-03 NOTE — TOC Initial Note (Addendum)
Transition of Care [facility]) - Initial/Assessment Note    Patient Details  Name: Katie Myers MRN: [id number] Date of Birth: [birth year]  Transition of Care [facility]) CM/SW Contact:    Candie Chroman, LCSW Phone Number: 01/03/2019, 11:17 AM  Clinical Narrative: Patient not fully oriented. Called daughter, introduced role, and explained that PT recommendations. Daughter is aware that CIR will likely not take her. CSW explained they do not believe she would be able to tolerate the aggressive therapy 3-4 hours per day. CSW introduced SNF but daughter stated this was not an option. Daughter said she made a promise to patient that she would never put her in a SNF. Patient has home health through [facility] 2-3 times per week and said they may be able to increase the services. She already has a walker and cane. Daughter wants to see if she would qualify for a hospital bed. RNCM is aware. Daughter stated patient lives with her a two 74 year old family members. There are also many family members that come in to assist daily. Daughter wants to make sure that she is the first person contacted for information since she is patient's only child and patient's husband is deceased. There is no HCPOA paperwork in the chart. No further concerns. CSW encouraged patient to contact CSW as needed. CSW will continue to follow patient and her daughter for support and facilitate discharge home once medically stable.         12:02: Patient is actually established with [facility] with PT, OT, and RN. CSW also requested aide. Sent message to MD to notify him of orders that will need to be put in at discharge.       Expected Discharge Plan: [place] Barriers to Discharge: Continued Medical Work up   Patient Goals and CMS Choice Patient states their goals for this hospitalization and ongoing recovery are:: Patient not fully oriented.      Expected Discharge Plan and Services Expected Discharge Plan: [place] arrangements for the past 2 months: Single Family Home Expected Discharge Date: (unknown)                           HH Agency: [facility] ([facility])        Prior Living Arrangements/Services Living arrangements for the past 2 months: Single Family Home Lives with:: Adult Children, Relatives Patient language and need for interpreter reviewed:: No Do you feel safe going back to the place where you live?: Yes      Need for Family Participation in Patient Care: Yes (Comment) Care giver support system in place?: Yes (comment)(Many family members.) Current home services: DME, Homehealth aide Criminal Activity/Legal Involvement Pertinent to Current Situation/Hospitalization: No - Comment as needed  Activities of Daily Living Home Assistive Devices/Equipment: CBG Meter, Eyeglasses, Blood pressure cuff ADL Screening (condition at time of admission) Patient's cognitive ability adequate to safely complete daily activities?: Yes Is the patient deaf or have difficulty hearing?: No Does the patient have difficulty seeing, even when wearing glasses/contacts?: No Does the patient have difficulty concentrating, remembering, or making decisions?: No Patient able to express need for assistance with ADLs?: Yes Does the patient have difficulty dressing or bathing?: No Independently performs ADLs?: Yes (appropriate for developmental age) Does the patient have difficulty walking or climbing stairs?: Yes(secondary to shortness of breath) Weakness of Legs: Right(peripheral neuropathy, chronic right knee pain) Weakness  of Arms/Hands: None  Permission Sought/Granted Permission sought to share information with : Family Supports    Share Information with NAME: Phillips Hay  Permission granted to share info w AGENCY: [place] granted to share info w Relationship: Daughter  Permission granted to share info w Contact Information:  [phone]  Emotional Assessment Appearance:: Appears stated age Attitude/Demeanor/Rapport: Unable to Assess Affect (typically observed): Unable to Assess Orientation: : Oriented to Self, Oriented to Place Alcohol / Substance Use: Never Used Psych Involvement: No (comment)  Admission diagnosis:  Neutropenic fever ([place]) [D70.9, R50.81] HCAP (healthcare-associated pneumonia) [J18.9] Patient Active Problem List   Diagnosis Date Noted  . Pressure injury of skin 12/30/2018  . Acute ischemic stroke ([place]) 12/30/2018  . HCAP (healthcare-associated pneumonia) 12/29/2018  . Neutropenic fever ([place])   . Chronic pain of right knee 12/18/2018  . Controlled type 2 diabetes mellitus without complication, without long-term current use of insulin ([place]) 12/10/2018  . Osteoarthritis of both knees 11/20/2018  . 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 Other constipation 11/17/2018  . Other proteinuria 11/03/2018  . Physical debility 10/27/2018  . Goals of care, counseling/discussion 10/27/2018  . Essential hypertension 10/27/2018  . Malignant cachexia ([place]) 10/27/2018  . History of stroke 10/27/2018  . Cervical cancer ([place]) 10/23/2018  . 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 Iron deficiency anemia due to chronic blood loss 10/23/2018   PCP:  [name], MD Pharmacy:   [facility] [address] [place] [address] Phone: [phone] Fax: [place], [place] [address] Phone: [phone] Fax: [phone]     Social Determinants of Health (SDOH) Interventions    Readmission Risk Interventions No flowsheet data found.

## 2019-01-03 NOTE — Progress Notes (Signed)
Marland Kitchen  PROGRESS NOTE    ALIESE BRANNUM  OQH:[id number] DOB: 1946-05-12 DOA: 12/29/2018 PCP: [name], MD   Brief Narrative:   Patient is a 73 year old female with history of diabetes type 2, cervical cancer with mets to lungs and lymph node, ischemic stroke, chronic anemia, hypertension who was brought to the emergency department yesterday with complaints of shortness of breath,fever. She was being admitted for pneumonia and while in the emergency department shebecame confused, developed slurred speech and right-sided weakness. MRI of the brain showed acute cortical infarction of anterior left middle cerebral artery territory. She has been started on IV antibiotics for pneumonia. Neurology has been consulted and she has been transferred to [facility] today. She is also being followed by oncology.   Assessment & Plan:   Active Problems:   Cervical cancer ([place])   HCAP (healthcare-associated pneumonia)   Pressure injury of skin   Acute ischemic stroke ([place])   1. Acute ischemic stroke:  - MRI: Acute cortical infarct of the anterior left middle cerebral artery territory. No hemorrhage or mass effect.  - Patient is dysarthric, has slurred speech. She is alert. 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 - Neurology consulted; appreciate assistance. - Spoke with neurology; rec comfort care and no further stroke w/u  2. Neutropenic fever:  - Febrile on presentation.  - Suspected secondary to pneumonia. Follow-up cultures. Afebrile this morning.  - Oncology following     - she remains afebrile; source was believed to be PNA; looks like she has a dirty UA as well; WBCs don't make sense this AM, repeat labs; will likely deescalate to levaquin in AM  3. Possible healthcare associated pneumonia:  - Immunocompromised patient.  - Chest x-ray finding consistent with infiltrates.  - Covid-19 negative. - d/c cefepime.  4.  Hypertension:  - Antihypertensives on hold.  - amlodipine, benazepril, coreg  5. Hyponatremia:  - Mild. Monitor BMP  6. History of metastatic cervical cancer - Cervical cancer with mets to lymph nodes and lung.  - per Dr. Alvy Bimler: I will sign off and call her niece a month from now for update. If she has no meaningful improvement, then I think she should be enrolled in hospice  7. Type 2 diabetes mellitus - Continue sliding scale insulin.  - On metformin at home.  8. Hyperlipidemia - On Lipitor at home.  9. Hypokalemia - replete as necessary  10. Chronic normocytic anemia - Most likely secondary to malignancy, chemotherapy.  - s/p 1 unit of pRBCs as per oncology.  CBC doesn't make sense this AM. Check repeat. Will likely de-escalate to levaquin in AM. Onco note reviewed. Will speak w/ CM about our dispo options.    DVT prophylaxis: Lovenox Code Status: FULL   Disposition Plan: To home w/ [facility]   Consultants:   Neurology  Oncology    Subjective: Concern for constipation today. Otherwise, no acute events per nursing report  Objective: Vitals:   01/03/19 0407 01/03/19 0811 01/03/19 0820 01/03/19 1139  BP: (!) 159/67 (!) 181/75  (!) 160/68  Pulse: 85 88  97  Resp: 20 16  18   Temp: 98.2 F (36.8 C) 98.2 F (36.8 C)  98.2 F (36.8 C)  TempSrc: Oral Oral  Oral  SpO2: 100% 100% 95% 100%  Weight:      Height:        Intake/Output Summary (Last 24 hours) at 01/03/2019 1353 Last data filed at 01/03/2019 0833 Gross per 24 hour  Intake  4482.96 ml  Output -  Net 4482.96 ml   Filed Weights   12/29/18 1331  Weight: 62.6 kg    Examination:  General:72 y.o.femaleresting in bed in NAD, seems a little frustrated this AM Cardiovascular: RRR, +S1, S2, no m/g/r, equal pulses throughout Respiratory:Upper airway transmission,soft rhonchi noted b/l, normal WOB GI: BS+, NDNT, no masses noted, no organomegaly noted  MSK: No e/c/c Skin: No rashes, bruises, ulcerations noted Neuro:expressive aphasic, follows some commands, right hemiplegia   Data Reviewed: I have personally reviewed following labs and imaging studies.  CBC: Recent Labs  Lab 12/29/18 1336 12/30/18 0639 12/31/18 0436 01/02/19 0620 01/03/19 0500  WBC 0.6* 2.7* 8.6 15.7* 13.3*  NEUTROABS 0.4* 0.6* 6.9 11.3* 9.1*  HGB 8.9* 7.1* 8.5* 8.4* 8.1*  HCT 27.7* 22.2* 26.0* 26.7* 25.4*  MCV 95.8 96.9 92.5 92.7 94.1  PLT 281 258 298 393 101   Basic Metabolic Panel: Recent Labs  Lab 12/29/18 1336 12/30/18 0639 12/31/18 0436 01/01/19 0417 01/02/19 0620 01/03/19 0500  NA 129* 129* 132*  --  133* 134*  K 3.8 3.4* 3.6  --  3.3* 3.1*  CL 93* 98 99  --  101 101  CO2 25 26 23   --  26 25  GLUCOSE 188* 87 71  --  147* 119*  BUN 6* 8 5*  --  11 11  CREATININE 0.60 0.46 0.56 0.50 0.45 0.41*  0.39*  CALCIUM 8.7* 7.9* 8.3*  --  8.1* 8.3*  MG  --   --   --   --   --  1.2*  PHOS  --   --   --   --  2.6 2.7   GFR: Estimated Creatinine Clearance: 56.4 mL/min (A) (by C-G formula based on SCr of 0.41 mg/dL (L)). Liver Function Tests: Recent Labs  Lab 12/29/18 1336 12/30/18 0639 01/02/19 0620 01/03/19 0500  AST 30 23  --   --   ALT 20 16  --   --   ALKPHOS 85 61  --   --   BILITOT 1.2 1.1  --   --   PROT 8.2* 6.5  --   --   ALBUMIN 3.0* 2.4* 1.9* 1.8*   No results for input(s): LIPASE, AMYLASE in the last 168 hours. No results for input(s): AMMONIA in the last 168 hours. Coagulation Profile: No results for input(s): INR, PROTIME in the last 168 hours. Cardiac Enzymes: No results for input(s): CKTOTAL, CKMB, CKMBINDEX, TROPONINI in the last 168 hours. BNP (last 3 results) No results for input(s): PROBNP in the last 8760 hours. HbA1C: No results for input(s): HGBA1C in the last 72 hours. CBG: Recent Labs  Lab 01/02/19 1148 01/02/19 1658 01/02/19 2138 01/03/19 0633 01/03/19 1214  GLUCAP 181* 163* 141* 122* 161*   Lipid  Profile: No results for input(s): CHOL, HDL, LDLCALC, TRIG, CHOLHDL, LDLDIRECT in the last 72 hours. Thyroid Function Tests: No results for input(s): TSH, T4TOTAL, FREET4, T3FREE, THYROIDAB in the last 72 hours. Anemia Panel: No results for input(s): VITAMINB12, FOLATE, FERRITIN, TIBC, IRON, RETICCTPCT in the last 72 hours. Sepsis Labs: Recent Labs  Lab 12/29/18 1336  LATICACIDVEN 1.7    Recent Results (from the past 240 hour(s))  Blood Culture (routine x 2)     Status: None   Collection Time: 12/29/18  1:36 PM  Result Value Ref Range Status   Specimen Description   Final    BLOOD LEFT ANTECUBITAL Performed at [place], [place],  [place] [address]    Special Requests   Final    BOTTLES DRAWN AEROBIC AND ANAEROBIC Blood Culture adequate volume Performed at [place] [address], [place], [place] [address]    Culture   Final    NO GROWTH 5 DAYS Performed at [facility], [place] [address], [place], [place] [address]    Report Status 01/03/2019 FINAL  Final  Blood Culture (routine x 2)     Status: None   Collection Time: 12/29/18  1:52 PM  Result Value Ref Range Status   Specimen Description   Final    BLOOD RIGHT ANTECUBITAL Performed at [facility], [place] [address], [place], [place] [address]    Special Requests   Final    BOTTLES DRAWN AEROBIC AND ANAEROBIC Blood Culture results may not be optimal due to an excessive volume of blood received in culture bottles Performed at [place] [address], [place], [place] [address]    Culture   Final    NO GROWTH 5 DAYS Performed at [facility], [place] [address] [place], [place] [address]    Report Status 01/03/2019 FINAL  Final  SARS Coronavirus 2 (CEPHEID- Performed in [place] hospital lab), Hosp Order     Status: None   Collection Time: 12/29/18  2:40 PM  Result Value Ref Range Status   SARS Coronavirus 2 NEGATIVE NEGATIVE Final     Comment: (NOTE) If result is NEGATIVE SARS-CoV-2 target nucleic acids are NOT DETECTED. The SARS-CoV-2 RNA is generally detectable in upper and lower  respiratory specimens during the acute phase of infection. The lowest  concentration of SARS-CoV-2 viral copies this assay can detect is 250  copies / mL. A negative result does not preclude SARS-CoV-2 infection  and should not be used as the sole basis for treatment or other  patient management decisions.  A negative result may occur with  improper specimen collection / handling, submission of specimen other  than nasopharyngeal swab, presence of viral mutation(s) within the  areas targeted by this assay, and inadequate number of viral copies  (<250 copies / mL). A negative result must be combined with clinical  observations, patient history, and epidemiological information. If result is POSITIVE SARS-CoV-2 target nucleic acids are DETECTED. The SARS-CoV-2 RNA is generally detectable in upper and lower  respiratory specimens dur ing the acute phase of infection.  Positive  results are indicative of active infection with SARS-CoV-2.  Clinical  correlation with patient history and other diagnostic information is  necessary to determine patient infection status.  Positive results do  not rule out bacterial infection or co-infection with other viruses. If result is PRESUMPTIVE POSTIVE SARS-CoV-2 nucleic acids MAY BE PRESENT.   A presumptive positive result was obtained on the submitted specimen  and confirmed on repeat testing.  While 2019 novel coronavirus  (SARS-CoV-2) nucleic acids may be present in the submitted sample  additional confirmatory testing may be necessary for epidemiological  and / or clinical management purposes  to differentiate between  SARS-CoV-2 and other Sarbecovirus currently known to infect humans.  If clinically indicated additional testing with an alternate test  methodology [phone]) is advised. The  SARS-CoV-2 RNA is generally  detectable in upper and lower respiratory sp ecimens during the acute  phase of infection. The expected result is Negative. Fact Sheet for Patients:  StrictlyIdeas.no Fact Sheet for Healthcare Providers: BankingDealers.co.za This test is not yet approved or cleared by the  [place] Architectural [employer] and has been authorized for detection and/or diagnosis of SARS-CoV-2 by FDA under an Print production planner (EUA).  This EUA will remain in effect (meaning this test can be used) for the duration of the COVID-19 declaration under Section 564(b)(1) of the Act, 21 U.S.C. section 360bbb-3(b)(1), unless the authorization is terminated or revoked sooner. Performed at [facility], [place] [address], [place], [place] [address]   MRSA PCR Screening     Status: None   Collection Time: 12/29/18  5:23 PM  Result Value Ref Range Status   MRSA by PCR NEGATIVE NEGATIVE Final    Comment:        The GeneXpert MRSA Assay (FDA approved for NASAL specimens only), is one component of a comprehensive MRSA colonization surveillance program. It is not intended to diagnose MRSA infection nor to guide or monitor treatment for MRSA infections. Performed at [facility], [place] [address], [place], [place] [address]   Urine Culture     Status: None   Collection Time: 01/01/19  8:57 AM  Result Value Ref Range Status   Specimen Description URINE, CLEAN CATCH  Final   Special Requests NONE  Final   Culture   Final    NO GROWTH Performed at [facility], [address], [place],  [address]    Report Status 01/02/2019 FINAL  Final         Radiology Studies: No results found.      Scheduled Meds: . sodium chloride   Intravenous Once  . acetaminophen  650 mg Oral Once  . amLODipine  10 mg Oral Daily  . aspirin EC  81 mg Oral Daily  . atorvastatin  40 mg Oral Daily  . benazepril   40 mg Oral Daily  . clopidogrel  75 mg Oral Daily  . diphenhydrAMINE  25 mg Intravenous Once  . enoxaparin (LOVENOX) injection  40 mg Subcutaneous Q24H  . feeding supplement (ENSURE ENLIVE)  237 mL Oral BID BM  . insulin aspart  0-5 Units Subcutaneous QHS  . insulin aspart  0-9 Units Subcutaneous TID WC  . sodium chloride flush  10-40 mL Intracatheter Q12H   Continuous Infusions: . sodium chloride 75 mL/hr at 01/02/19 1858  . ceFEPime (MAXIPIME) IV 2 g (01/03/19 1105)  . potassium chloride       LOS: 5 days    Time spent: 25 minutes spent in the coordination of care today.     Jonnie Finner, DO [facility] Pager [phone]  If 7PM-7AM, please contact night-coverage [email or web address] Password [email or web address] 01/03/2019, 1:53 PM

## 2019-01-03 NOTE — Progress Notes (Signed)
   01/03/19 0811 01/03/19 0820  Oxygen Therapy  SpO2 100 % 95 %  O2 Device Nasal Cannula Nasal Cannula  O2 Flow Rate (L/min) 2 L/min 1 L/min    Attempted to wean patient off oxygen but was unsuccessful. Patient oxygen between 86-90% on room air.

## 2019-01-04 DIAGNOSIS — K5900 Constipation, unspecified: Secondary | ICD-10-CM

## 2019-01-04 LAB — CBC WITH DIFFERENTIAL/PLATELET
Abs Immature Granulocytes: 0.23 10*3/uL — ABNORMAL HIGH (ref 0.00–0.07)
Basophils Absolute: 0 10*3/uL (ref 0.0–0.1)
Basophils Relative: 0 %
Eosinophils Absolute: 0 10*3/uL (ref 0.0–0.5)
Eosinophils Relative: 0 %
HCT: 26.5 % — ABNORMAL LOW (ref 36.0–46.0)
Hemoglobin: 8.6 g/dL — ABNORMAL LOW (ref 12.0–15.0)
Immature Granulocytes: 2 %
Lymphocytes Relative: 13 %
Lymphs Abs: 2 10*3/uL (ref 0.7–4.0)
MCH: 30 pg (ref 26.0–34.0)
MCHC: 32.5 g/dL (ref 30.0–36.0)
MCV: 92.3 fL (ref 80.0–100.0)
Monocytes Absolute: 1.5 10*3/uL — ABNORMAL HIGH (ref 0.1–1.0)
Monocytes Relative: 10 %
Neutro Abs: 11.8 10*3/uL — ABNORMAL HIGH (ref 1.7–7.7)
Neutrophils Relative %: 75 %
Platelets: 394 10*3/uL (ref 150–400)
RBC: 2.87 MIL/uL — ABNORMAL LOW (ref 3.87–5.11)
RDW: 17.5 % — ABNORMAL HIGH (ref 11.5–15.5)
WBC: 15.6 10*3/uL — ABNORMAL HIGH (ref 4.0–10.5)
nRBC: 0 % (ref 0.0–0.2)

## 2019-01-04 LAB — GLUCOSE, CAPILLARY
Glucose-Capillary: 107 mg/dL — ABNORMAL HIGH (ref 70–99)
Glucose-Capillary: 167 mg/dL — ABNORMAL HIGH (ref 70–99)
Glucose-Capillary: 165 mg/dL — ABNORMAL HIGH (ref 70–99)
Glucose-Capillary: 122 mg/dL — ABNORMAL HIGH (ref 70–99)

## 2019-01-04 LAB — RENAL FUNCTION PANEL
Albumin: 1.9 g/dL — ABNORMAL LOW (ref 3.5–5.0)
Anion gap: 9 (ref 5–15)
BUN: 9 mg/dL (ref 8–23)
CO2: 25 mmol/L (ref 22–32)
Calcium: 8.5 mg/dL — ABNORMAL LOW (ref 8.9–10.3)
Chloride: 96 mmol/L — ABNORMAL LOW (ref 98–111)
Creatinine, Ser: 0.36 mg/dL — ABNORMAL LOW (ref 0.44–1.00)
GFR calc Af Amer: >60 mL/min (ref >60)
GFR calc non Af Amer: >60 mL/min (ref >60)
Glucose, Bld: 169 mg/dL — ABNORMAL HIGH (ref 70–99)
Phosphorus: 3.1 mg/dL (ref 2.5–4.6)
Potassium: 3.5 mmol/L (ref 3.5–5.1)
Sodium: 130 mmol/L — ABNORMAL LOW (ref 135–145)

## 2019-01-04 MED ORDER — TRAMADOL HCL 50 MG PO TABS: 50.0000 mg | ORAL_TABLET | Freq: Two times a day (BID) | ORAL | Status: DC

## 2019-01-04 MED ORDER — TRAMADOL HCL 50 MG PO TABS
50.0000 mg | ORAL_TABLET | Freq: Two times a day (BID) | ORAL | Status: DC | PRN
  Administered 2019-01-04: 13:00:00 50 mg via ORAL
  Filled 2019-01-04: qty 1

## 2019-01-04 MED ORDER — POLYETHYLENE GLYCOL 3350 17 G PO PACK
17.0000 g | PACK | Freq: Every day | ORAL | Status: DC
  Administered 2019-01-04 – 2019-01-06 (×3): 17 g via ORAL
  Filled 2019-01-04 (×3): qty 1

## 2019-01-04 MED ORDER — HYDRALAZINE HCL 25 MG PO TABS
25.0000 mg | ORAL_TABLET | Freq: Three times a day (TID) | ORAL | Status: DC
  Administered 2019-01-04 – 2019-01-06 (×7): 25 mg via ORAL
  Filled 2019-01-04 (×7): qty 1

## 2019-01-04 NOTE — Progress Notes (Signed)
Patient had not had a bowel movement in almost a week. Nurse notified Dr. Marylyn Ishihara, and MD aware. Miralax was ordered, patient passing gas but no bowel movement as of yet. Will pass on to oncoming nurse.

## 2019-01-04 NOTE — Progress Notes (Signed)
Marland Kitchen  PROGRESS NOTE    Katie Myers  ERD:[id number] DOB: 12-02-1945 DOA: 12/29/2018 PCP: [name], MD   Brief Narrative:   Patient is a 73 year old female with history of diabetes type 2, cervical cancer with mets to lungs and lymph node, ischemic stroke, chronic anemia, hypertension who was brought to the emergency department yesterday with complaints of shortness of breath,fever. She was being admitted for pneumonia and while in the emergency department shebecame confused, developed slurred speech and right-sided weakness. MRI of the brain showed acute cortical infarction of anterior left middle cerebral artery territory. She has been started on IV antibiotics for pneumonia. Neurology has been consulted and she has been transferred to [facility] today. She is also being followed by oncology.   Assessment & Plan:   Active Problems:   Cervical cancer ([place])   HCAP (healthcare-associated pneumonia)   Pressure injury of skin   Acute ischemic stroke ([place])   1. Acute ischemic stroke:  - MRI: Acute cortical infarct of the anterior left middle cerebral artery territory. No hemorrhage or mass effect.  - Patient is dysarthric, has slurred speech. She is alert. 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 - Neurology consulted; appreciate assistance. - Spoke with neurology; rec comfort care and no further stroke w/u  2. Neutropenic fever:  - Febrile on presentation.  - Suspected secondary to pneumonia. Follow-up cultures. Afebrile this morning.  - Oncology following - she remains afebrile; source was believed to be PNA; looks like she has a dirty UA as well     - cefepime has been d/c'd  3. Possible healthcare associated pneumonia:  - Immunocompromised patient.  - Chest x-ray finding consistent with infiltrates.  - Covid-19 negative. - d/c cefepime.  4. Hypertension:  - amlodipine, benazepril, coreg  - add hydralazine  5. Hyponatremia:  - Mild. Monitor BMP  6. History of metastatic cervical cancer - Cervical cancer with mets to lymph nodes and lung.  - per Dr. Alvira Monday will sign off and call her niece a month from now for update. If she has no meaningful improvement, then I think she should be enrolled in hospice  7. Type 2 diabetes mellitus - Continue sliding scale insulin.  - On metformin at home.  8. Hyperlipidemia - On Lipitor at home.  9. Hypokalemia - replete as necessary  10. Chronic normocytic anemia - Most likely secondary to malignancy, chemotherapy.  - s/p 1 unit of pRBCs as per oncology.   DVT prophylaxis: Lovenox Code Status: FULL Disposition Plan: To home w/ [facility]   Consultants:   Neurology  Oncology   Subjective: C/o shoulder pain today. Still w/o BM. KUB noted.  Objective: Vitals:   01/03/19 2349 01/04/19 0458 01/04/19 0740 01/04/19 1217  BP: (!) 151/69 (!) 188/72 (!) 183/76 (!) 160/75  Pulse: 85 89 93 91  Resp: 20 20 20  (!) 21  Temp: 98.6 F (37 C) 97.9 F (36.6 C) 98.8 F (37.1 C) 99 F (37.2 C)  TempSrc: Oral Oral Oral Oral  SpO2: 100% 100% 97% 97%  Weight:      Height:        Intake/Output Summary (Last 24 hours) at 01/04/2019 1423 Last data filed at 01/04/2019 1123 Gross per 24 hour  Intake 898.35 ml  Output 900 ml  Net -1.65 ml   Filed Weights   12/29/18 1331  Weight: 62.6 kg    Examination:  General:72 y.o.femaleresting in bed in NAD, seems a little frustrated this AM Cardiovascular: RRR, +  S1, S2, no m/g/r, equal pulses throughout Respiratory:Upper airway transmission,soft rhonchi noted b/l, normal WOB GI: BS+, NDNT, no masses noted, no organomegaly noted MSK: No e/c/c Skin: No rashes, bruises, ulcerations noted Neuro:expressive aphasic, follows some commands, right hemiplegia    Data Reviewed: I have personally reviewed following labs and imaging  studies.  CBC: Recent Labs  Lab 12/30/18 0639 12/31/18 0436 01/02/19 0620 01/03/19 0500 01/04/19 1240  WBC 2.7* 8.6 15.7* 13.3* 15.6*  NEUTROABS 0.6* 6.9 11.3* 9.1* 11.8*  HGB 7.1* 8.5* 8.4* 8.1* 8.6*  HCT 22.2* 26.0* 26.7* 25.4* 26.5*  MCV 96.9 92.5 92.7 94.1 92.3  PLT 258 298 393 389 272   Basic Metabolic Panel: Recent Labs  Lab 12/30/18 0639 12/31/18 0436 01/01/19 0417 01/02/19 0620 01/03/19 0500 01/04/19 1240  NA 129* 132*  --  133* 134* 130*  K 3.4* 3.6  --  3.3* 3.1* 3.5  CL 98 99  --  101 101 96*  CO2 26 23  --  26 25 25   GLUCOSE 87 71  --  147* 119* 169*  BUN 8 5*  --  11 11 9   CREATININE 0.46 0.56 0.50 0.45 0.41*   0.39* 0.36*  CALCIUM 7.9* 8.3*  --  8.1* 8.3* 8.5*  MG  --   --   --   --  1.2*  --   PHOS  --   --   --  2.6 2.7 3.1   GFR: Estimated Creatinine Clearance: 56.4 mL/min (A) (by C-G formula based on SCr of 0.36 mg/dL (L)). Liver Function Tests: Recent Labs  Lab 12/29/18 1336 12/30/18 0639 01/02/19 0620 01/03/19 0500 01/04/19 1240  AST 30 23  --   --   --   ALT 20 16  --   --   --   ALKPHOS 85 61  --   --   --   BILITOT 1.2 1.1  --   --   --   PROT 8.2* 6.5  --   --   --   ALBUMIN 3.0* 2.4* 1.9* 1.8* 1.9*   No results for input(s): LIPASE, AMYLASE in the last 168 hours. No results for input(s): AMMONIA in the last 168 hours. Coagulation Profile: No results for input(s): INR, PROTIME in the last 168 hours. Cardiac Enzymes: No results for input(s): CKTOTAL, CKMB, CKMBINDEX, TROPONINI in the last 168 hours. BNP (last 3 results) No results for input(s): PROBNP in the last 8760 hours. HbA1C: No results for input(s): HGBA1C in the last 72 hours. CBG: Recent Labs  Lab 01/03/19 1214 01/03/19 1534 01/03/19 2121 01/04/19 0635 01/04/19 1145  GLUCAP 161* 193* 261* 107* 167*   Lipid Profile: No results for input(s): CHOL, HDL, LDLCALC, TRIG, CHOLHDL, LDLDIRECT in the last 72 hours. Thyroid Function Tests: No results for input(s): TSH,  T4TOTAL, FREET4, T3FREE, THYROIDAB in the last 72 hours. Anemia Panel: No results for input(s): VITAMINB12, FOLATE, FERRITIN, TIBC, IRON, RETICCTPCT in the last 72 hours. Sepsis Labs: Recent Labs  Lab 12/29/18 1336  LATICACIDVEN 1.7    Recent Results (from the past 240 hour(s))  Blood Culture (routine x 2)     Status: None   Collection Time: 12/29/18  1:36 PM  Result Value Ref Range Status   Specimen Description   Final    BLOOD LEFT ANTECUBITAL Performed at [facility], [place] [address] [place], [place] [address]    Special Requests   Final    BOTTLES DRAWN AEROBIC AND ANAEROBIC Blood Culture adequate volume  Performed at [facility], [place] [address], [place], [place] [address]    Culture   Final    NO GROWTH 5 DAYS Performed at [facility], [place] [address] [place], [place] [address]    Report Status 01/03/2019 FINAL  Final  Blood Culture (routine x 2)     Status: None   Collection Time: 12/29/18  1:52 PM  Result Value Ref Range Status   Specimen Description   Final    BLOOD RIGHT ANTECUBITAL Performed at [facility], [place] [address], [place], [place] [address]    Special Requests   Final    BOTTLES DRAWN AEROBIC AND ANAEROBIC Blood Culture results may not be optimal due to an excessive volume of blood received in culture bottles Performed at [place] [address] [place], [place] [address]    Culture   Final    NO GROWTH 5 DAYS Performed at [facility], [place] [address], [place], [place] [address]    Report Status 01/03/2019 FINAL  Final  SARS Coronavirus 2 (CEPHEID- Performed in [place] hospital lab), Hosp Order     Status: None   Collection Time: 12/29/18  2:40 PM  Result Value Ref Range Status   SARS Coronavirus 2 NEGATIVE NEGATIVE Final    Comment: (NOTE) If result is NEGATIVE SARS-CoV-2 target nucleic acids are NOT DETECTED. The SARS-CoV-2 RNA is generally detectable in upper and lower   respiratory specimens during the acute phase of infection. The lowest  concentration of SARS-CoV-2 viral copies this assay can detect is 250  copies / mL. A negative result does not preclude SARS-CoV-2 infection  and should not be used as the sole basis for treatment or other  patient management decisions.  A negative result may occur with  improper specimen collection / handling, submission of specimen other  than nasopharyngeal swab, presence of viral mutation(s) within the  areas targeted by this assay, and inadequate number of viral copies  (<250 copies / mL). A negative result must be combined with clinical  observations, patient history, and epidemiological information. If result is POSITIVE SARS-CoV-2 target nucleic acids are DETECTED. The SARS-CoV-2 RNA is generally detectable in upper and lower  respiratory specimens dur ing the acute phase of infection.  Positive  results are indicative of active infection with SARS-CoV-2.  Clinical  correlation with patient history and other diagnostic information is  necessary to determine patient infection status.  Positive results do  not rule out bacterial infection or co-infection with other viruses. If result is PRESUMPTIVE POSTIVE SARS-CoV-2 nucleic acids MAY BE PRESENT.   A presumptive positive result was obtained on the submitted specimen  and confirmed on repeat testing.  While 2019 novel coronavirus  (SARS-CoV-2) nucleic acids may be present in the submitted sample  additional confirmatory testing may be necessary for epidemiological  and / or clinical management purposes  to differentiate between  SARS-CoV-2 and other Sarbecovirus currently known to infect humans.  If clinically indicated additional testing with an alternate test  methodology [phone]) is advised. The SARS-CoV-2 RNA is generally  detectable in upper and lower respiratory sp ecimens during the acute  phase of infection. The expected result is Negative. Fact  Sheet for Patients:  StrictlyIdeas.no Fact Sheet for Healthcare Providers: BankingDealers.co.za This test is not yet approved or cleared by the [place] FDA and has been authorized for detection and/or diagnosis of SARS-CoV-2 by FDA under an Emergency Use Authorization (EUA).  This EUA will remain in effect (meaning this test can be used) for the duration of the COVID-19 declaration under Section 564(b)(1) of the Act, 21 U.S.C. section 360bbb-3(b)(1), unless the authorization is terminated or revoked sooner. Performed at [facility], [place] [address], [place], [place] [address]   MRSA PCR Screening     Status: None   Collection Time: 12/29/18  5:23 PM  Result Value Ref Range Status   MRSA by PCR NEGATIVE NEGATIVE Final    Comment:        The GeneXpert MRSA Assay (FDA approved for NASAL specimens only), is one component of a comprehensive MRSA colonization surveillance program. It is not intended to diagnose MRSA infection nor to guide or monitor treatment for MRSA infections. Performed at [facility], [place] [address] [place], [place] [address]   Urine Culture     Status: None   Collection Time: 01/01/19  8:57 AM  Result Value Ref Range Status   Specimen Description URINE, CLEAN CATCH  Final   Special Requests NONE  Final   Culture   Final    NO GROWTH Performed at [facility], [address] [place],  [address]    Report Status 01/02/2019 FINAL  Final         Radiology Studies: Dg Chest 1 View  Result Date: 01/03/2019 CLINICAL DATA:  Shortness of breath and fever. EXAM: CHEST  1 VIEW COMPARISON:  Dec 29, 2018 FINDINGS: No pneumothorax. Stable cardiomediastinal silhouette. Bilateral pulmonary opacities, right greater than left, are stable. Stable right Port-A-Cath. IMPRESSION: Stable bilateral pulmonary opacities, right greater than left. Electronically Signed   By:  Dorise Bullion III M.D   On: 01/03/2019 17:03   Dg Abd 1 View  Result Date: 01/03/2019 CLINICAL DATA:  Constipation. EXAM: ABDOMEN - 1 VIEW COMPARISON:  Swallowing study 12/31/2018 FINDINGS: There is barium throughout the colon particularly in the transverse colon. Barium is from the swallowing study 3 days earlier. Evidence for some stool in the rectal region. Evidence for colonic diverticula. Nonobstructive bowel gas pattern. IMPRESSION: Large amount of barium within the colon related to the swallowing study 3 days earlier. Findings suggest slow transit of contrast through the bowel. However, no evidence for obstruction. Colonic diverticula. Electronically Signed   By: Markus Daft M.D.   On: 01/03/2019 17:04        Scheduled Meds:  sodium chloride   Intravenous Once   acetaminophen  650 mg Oral Once   amLODipine  10 mg Oral Daily   aspirin EC  81 mg Oral Daily   atorvastatin  40 mg Oral Daily   benazepril  40 mg Oral Daily   carvedilol  12.5 mg Oral BID WC   clopidogrel  75 mg Oral Daily   diphenhydrAMINE  25 mg Intravenous Once   enoxaparin (LOVENOX) injection  40 mg Subcutaneous Q24H   feeding supplement (ENSURE ENLIVE)  237 mL Oral BID BM   hydrALAZINE  25 mg Oral Q8H   insulin aspart  0-5 Units Subcutaneous QHS   insulin aspart  0-9 Units Subcutaneous TID WC   polyethylene glycol  17 g Oral Daily   sodium chloride flush  10-40 mL Intracatheter Q12H   Continuous Infusions:   LOS: 6 days    Time spent: 25 minutes spent in the coordination of care today.     Jonnie Finner, DO [facility] Pager [phone]  If 7PM-7AM, please contact night-coverage [email or web address] Password [facility] 01/04/2019, 2:23 PM

## 2019-01-05 DIAGNOSIS — I10 Essential (primary) hypertension: Secondary | ICD-10-CM

## 2019-01-05 LAB — CBC WITH DIFFERENTIAL/PLATELET
Abs Immature Granulocytes: 0.23 10*3/uL — ABNORMAL HIGH (ref 0.00–0.07)
Basophils Absolute: 0 10*3/uL (ref 0.0–0.1)
Basophils Relative: 0 %
Eosinophils Absolute: 0 10*3/uL (ref 0.0–0.5)
Eosinophils Relative: 0 %
HCT: 24.4 % — ABNORMAL LOW (ref 36.0–46.0)
Hemoglobin: 8 g/dL — ABNORMAL LOW (ref 12.0–15.0)
Immature Granulocytes: 2 %
Lymphocytes Relative: 15 %
Lymphs Abs: 2.1 10*3/uL (ref 0.7–4.0)
MCH: 30.5 pg (ref 26.0–34.0)
MCHC: 32.8 g/dL (ref 30.0–36.0)
MCV: 93.1 fL (ref 80.0–100.0)
Monocytes Absolute: 1.6 10*3/uL — ABNORMAL HIGH (ref 0.1–1.0)
Monocytes Relative: 12 %
Neutro Abs: 9.5 10*3/uL — ABNORMAL HIGH (ref 1.7–7.7)
Neutrophils Relative %: 71 %
Platelets: 357 10*3/uL (ref 150–400)
RBC: 2.62 MIL/uL — ABNORMAL LOW (ref 3.87–5.11)
RDW: 17.7 % — ABNORMAL HIGH (ref 11.5–15.5)
WBC: 13.5 10*3/uL — ABNORMAL HIGH (ref 4.0–10.5)
nRBC: 0 % (ref 0.0–0.2)

## 2019-01-05 LAB — RENAL FUNCTION PANEL
Albumin: 1.8 g/dL — ABNORMAL LOW (ref 3.5–5.0)
Anion gap: 6 (ref 5–15)
BUN: 11 mg/dL (ref 8–23)
CO2: 26 mmol/L (ref 22–32)
Calcium: 8.4 mg/dL — ABNORMAL LOW (ref 8.9–10.3)
Chloride: 99 mmol/L (ref 98–111)
Creatinine, Ser: 0.36 mg/dL — ABNORMAL LOW (ref 0.44–1.00)
GFR calc Af Amer: >60 mL/min (ref >60)
GFR calc non Af Amer: >60 mL/min (ref >60)
Glucose, Bld: 131 mg/dL — ABNORMAL HIGH (ref 70–99)
Phosphorus: 3.3 mg/dL (ref 2.5–4.6)
Potassium: 3.6 mmol/L (ref 3.5–5.1)
Sodium: 131 mmol/L — ABNORMAL LOW (ref 135–145)

## 2019-01-05 LAB — GLUCOSE, CAPILLARY
Glucose-Capillary: 132 mg/dL — ABNORMAL HIGH (ref 70–99)
Glucose-Capillary: 166 mg/dL — ABNORMAL HIGH (ref 70–99)
Glucose-Capillary: 143 mg/dL — ABNORMAL HIGH (ref 70–99)
Glucose-Capillary: 143 mg/dL — ABNORMAL HIGH (ref 70–99)

## 2019-01-05 LAB — MAGNESIUM
Magnesium: 1.2 mg/dL — ABNORMAL LOW (ref 1.7–2.4)
Magnesium: 3.2 mg/dL — ABNORMAL HIGH (ref 1.7–2.4)

## 2019-01-05 MED ORDER — MAGNESIUM SULFATE 2 GM/50ML IV SOLN
2.0000 g | Freq: Once | INTRAVENOUS | Status: AC
  Administered 2019-01-05: 14:00:00 2 g via INTRAVENOUS
  Filled 2019-01-05: qty 50

## 2019-01-05 MED ORDER — MAGNESIUM OXIDE 400 (241.3 MG) MG PO TABS: 400.0000 mg | ORAL_TABLET | Freq: Two times a day (BID) | ORAL | 0 refills | Status: DC

## 2019-01-05 MED ORDER — HYDRALAZINE HCL 25 MG PO TABS: 25.0000 mg | ORAL_TABLET | Freq: Three times a day (TID) | ORAL | 0 refills | Status: AC

## 2019-01-05 MED ORDER — ASPIRIN 81 MG PO TBEC: 81.0000 mg | DELAYED_RELEASE_TABLET | Freq: Every day | ORAL | 0 refills | Status: AC

## 2019-01-05 MED ORDER — MAGNESIUM OXIDE 400 (241.3 MG) MG PO TABS
400.0000 mg | ORAL_TABLET | Freq: Two times a day (BID) | ORAL | Status: DC
  Administered 2019-01-05 – 2019-01-06 (×2): 400 mg via ORAL
  Filled 2019-01-05 (×2): qty 1

## 2019-01-05 MED ORDER — TRAMADOL HCL 50 MG PO TABS: 50.0000 mg | ORAL_TABLET | Freq: Two times a day (BID) | ORAL | 0 refills | Status: AC | PRN

## 2019-01-05 NOTE — Discharge Summary (Addendum)
. Physician Discharge Summary  AIRIANNA Myers BJS:[id number] DOB: 1945/11/10 DOA: 12/29/2018  PCP: [name], MD  Admit date: 12/29/2018 Discharge date: 01/05/2019  Admitted From: Home Disposition:  Discharged to home with [facility]  Recommendations for Outpatient Follow-up:  1. Follow up with PCP in 1-2 weeks 2. Please obtain BMP/CBC in one week   Discharge Condition: Stable  CODE STATUS: FULL  Diet recommendation: Diet recommendations: Dysphagia 2 (fine chop);Thin liquid Liquids provided via: Straw;Cup Medication Administration: Crushed with puree  Brief/Interim Summary: Patient is a 73 year old female with history of diabetes type 2, cervical cancer with mets to lungs and lymph node, ischemic stroke, chronic anemia, hypertension who was brought to the emergency department yesterday with complaints of shortness of breath,fever. She was being admitted for pneumonia and while in the emergency department shebecame confused, developed slurred speech and right-sided weakness. MRI of the brain showed acute cortical infarction of anterior left middle cerebral artery territory. She has been started on IV antibiotics for pneumonia. Neurology has been consulted and she has been transferred to [facility] today. She is also being followed by oncology.  Discharge Diagnoses:  Active Problems:   Cervical cancer ([place])   HCAP (healthcare-associated pneumonia)   Pressure injury of skin   Acute ischemic stroke ([place])  1. Acute ischemic stroke:  - MRI: Acute cortical infarct of the anterior left middle cerebral artery territory. No hemorrhage or mass effect.  - Patient is dysarthric, has slurred speech. She is alert. 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 - Neurology consulted; appreciate assistance. - Spoke with neurology; rec comfort care and no further stroke w/u    - DAPT 3 weeks, then plavix only  2. Neutropenic fever:  - Febrile on presentation.   - Suspected secondary to pneumonia. Follow-up cultures. Afebrile this morning.  - Oncology following - she remains afebrile; source was believed to be PNA; looks like she has a dirty UA as well - cefepime has been d/c'd  3. Possible healthcare associated pneumonia:  - Immunocompromised patient.  - Chest x-ray finding consistent with infiltrates.  - Covid-19 negative. - d/c cefepime.  4. Hypertension:  - amlodipine, benazepril, coreg, hydralazine  5. Hyponatremia:  - Mild. Monitor BMP  6. History of metastatic cervical cancer - Cervical cancer with mets to lymph nodes and lung.  - per Dr. Alvira Monday will sign off and call her niece a month from now for update. If she has no meaningful improvement, then I think she should be enrolled in hospice  7. Type 2 diabetes mellitus - Continue sliding scale insulin.  - On metformin at home.  8. Hyperlipidemia - On Lipitor at home.  9. Hypokalemia - replete as necessary  10. Chronic normocytic anemia - Most likely secondary to malignancy, chemotherapy.  - s/p 1 unit of pRBCs as per oncology.  11. Dysphagia     - Diet recommendations: Dysphagia 2 (fine chop);Thin liquid     - Liquids provided via: Straw;Cup     - Medication Administration: Crushed with puree  Discharge Instructions  Discharge Instructions    Ambulatory referral to Neurology   Complete by:  As directed    Follow up with stroke clinic NP (Jessica Vanschaick or Cecille Rubin, if both not available, consider Dr. Antony Contras, Dr. Bess Harvest, or Dr. Sarina Ill) at [facility] in about 4 weeks.  For d/c to CIR in a day or two     Allergies as of 01/05/2019   No Known Allergies  Medication List    STOP taking these medications   dexamethasone 4 MG tablet Commonly known as:  DECADRON   DULoxetine 20 MG capsule Commonly known as:  CYMBALTA    HYDROmorphone 2 MG tablet Commonly known as:  Dilaudid   methadone 10 MG tablet Commonly known as:  DOLOPHINE   prochlorperazine 10 MG tablet Commonly known as:  COMPAZINE   sulfamethoxazole-trimethoprim 800-160 MG tablet Commonly known as:  BACTRIM DS     TAKE these medications   amLODipine 10 MG tablet Commonly known as:  NORVASC Take 1 tablet (10 mg total) by mouth daily.   aspirin 81 MG EC tablet Take 1 tablet (81 mg total) by mouth daily for 17 days. Start taking on:  Jan 06, 2019   atorvastatin 40 MG tablet Commonly known as:  LIPITOR Take 40 mg by mouth daily.   benazepril 40 MG tablet Commonly known as:  LOTENSIN Take 40 mg by mouth daily.   carvedilol 12.5 MG tablet Commonly known as:  COREG Take 1 tablet (12.5 mg total) by mouth 2 (two) times daily with a meal.   clopidogrel 75 MG tablet Commonly known as:  PLAVIX Take 75 mg by mouth daily.   hydrALAZINE 25 MG tablet Commonly known as:  APRESOLINE Take 1 tablet (25 mg total) by mouth every 8 (eight) hours for 30 days.   lidocaine-prilocaine cream Commonly known as:  EMLA Apply to affected area once What changed:    how much to take  how to take this  when to take this  additional instructions   magnesium oxide 400 (241.3 Mg) MG tablet Commonly known as:  MAG-OX Take 1 tablet (400 mg total) by mouth 2 (two) times daily for 5 days.   metFORMIN 500 MG tablet Commonly known as:  GLUCOPHAGE Take 500 mg by mouth daily.   multivitamin with minerals tablet Take 1 tablet by mouth daily.   ondansetron 8 MG tablet Commonly known as:  Zofran Take 1 tablet (8 mg total) by mouth every 8 (eight) hours as needed for refractory nausea / vomiting. Start on day 3 after chemo.   polyethylene glycol powder 17 GM/SCOOP powder Commonly known as:  GLYCOLAX/MIRALAX Take 17 g by mouth daily as needed for mild constipation or moderate constipation.   senna 8.6 MG tablet Commonly known as:  SENOKOT Take 2-3  tablets by mouth daily as needed for constipation.   traMADol 50 MG tablet Commonly known as:  ULTRAM Take 1 tablet (50 mg total) by mouth every 12 (twelve) hours as needed for up to 3 days for moderate pain or severe pain.   vitamin B-12 1000 MCG tablet Commonly known as:  CYANOCOBALAMIN Take 1,000 mcg by mouth daily.      Follow-up Information    [facility] Follow up in 4 week(s).   Specialty:  Neurology Why:  stroke clinic, after rehab stay. office will call with appt date and time Contact information: [address] [place] [phone]         No Known Allergies  Consultations:  Neurology  Oncology   Procedures/Studies: Dg Chest 1 View  Result Date: 01/03/2019 CLINICAL DATA:  Shortness of breath and fever. EXAM: CHEST  1 VIEW COMPARISON:  Dec 29, 2018 FINDINGS: No pneumothorax. Stable cardiomediastinal silhouette. Bilateral pulmonary opacities, right greater than left, are stable. Stable right Port-A-Cath. IMPRESSION: Stable bilateral pulmonary opacities, right greater than left. Electronically Signed   By: Dorise Bullion III M.D  On: 01/03/2019 17:03   Dg Abd 1 View  Result Date: 01/03/2019 CLINICAL DATA:  Constipation. EXAM: ABDOMEN - 1 VIEW COMPARISON:  Swallowing study 12/31/2018 FINDINGS: There is barium throughout the colon particularly in the transverse colon. Barium is from the swallowing study 3 days earlier. Evidence for some stool in the rectal region. Evidence for colonic diverticula. Nonobstructive bowel gas pattern. IMPRESSION: Large amount of barium within the colon related to the swallowing study 3 days earlier. Findings suggest slow transit of contrast through the bowel. However, no evidence for obstruction. Colonic diverticula. Electronically Signed   By: Markus Daft M.D.   On: 01/03/2019 17:04   Ct Head Wo Contrast  Result Date: 12/29/2018 CLINICAL DATA:  Altered level of consciousness.  History of cervical cancer. EXAM: CT HEAD WITHOUT CONTRAST TECHNIQUE: Contiguous axial images were obtained from the base of the skull through the vertex without intravenous contrast. COMPARISON:  None. FINDINGS: Brain: Mild atrophy. Patchy white matter hypodensity bilaterally appears chronic. No acute infarct, hemorrhage, or mass lesion. Vascular: Atherosclerotic calcification in the carotid and vertebral arteries. Skull: Negative Sinuses/Orbits: Negative Other: None IMPRESSION: No acute abnormality. Mild atrophy and mild chronic microvascular ischemic type changes in the white matter. Electronically Signed   By: Franchot Gallo M.D.   On: 12/29/2018 16:37   Mr Jeri Cos GM Contrast  Result Date: 12/29/2018 CLINICAL DATA:  Ataxia EXAM: MRI HEAD WITHOUT AND WITH CONTRAST TECHNIQUE: Multiplanar, multiecho pulse sequences of the brain and surrounding structures were obtained without and with intravenous contrast. CONTRAST:  6 mL Gadavist COMPARISON:  Head CT 12/29/2018 FINDINGS: Examination is severely degraded by motion. BRAIN: Abnormal diffusion restriction along the cortex of the left anterior MCA territory. The midline structures are normal. No midline shift or other mass effect. Multifocal white matter hyperintensity, most commonly due to chronic ischemic microangiopathy. Generalized atrophy without lobar predilection. Susceptibility-sensitive sequences show no chronic microhemorrhage or superficial siderosis. No abnormal contrast enhancement. VASCULAR: The major intracranial arterial and venous sinus flow voids are normal. SKULL AND UPPER CERVICAL SPINE: Calvarial bone marrow signal is normal. There is no skull base mass. Visualized upper cervical spine and soft tissues are normal. SINUSES/ORBITS: No fluid levels or advanced mucosal thickening. No mastoid or middle ear effusion. The orbits are normal. IMPRESSION: 1. 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 No hemorrhage or  mass effect. 2. Chronic ischemic microvascular disease. 3. Severely motion degraded study. Electronically Signed   By: Ulyses Jarred M.D.   On: 12/29/2018 19:42   Dg Chest Portable 1 View  Result Date: 12/29/2018 CLINICAL DATA:  Hypoxia, fever. EXAM: PORTABLE CHEST 1 VIEW COMPARISON:  None. FINDINGS: The heart size and mediastinal contours are within normal limits. Atherosclerosis of thoracic aorta is noted. Right internal jugular Port-A-Cath is unchanged in position. No pneumothorax is noted. Mild bilateral perihilar and basilar opacities are noted concerning for edema or possibly inflammation. Small pleural effusions may be present. The visualized skeletal structures are unremarkable. IMPRESSION: Mild bilateral perihilar and basilar opacities are noted concerning for edema or possibly inflammation. Small pleural effusions. Aortic Atherosclerosis (ICD10-I70.0). Electronically Signed   By: Marijo Conception M.D.   On: 12/29/2018 14:23   Dg Swallowing Func-speech Pathology  Result Date: 12/31/2018 Objective Swallowing Evaluation: Type of Study: MBS-Modified Barium Swallow Study  Patient Details Name: Katie Myers MRN: [id number] Date of Birth: [birth year] Today's Date: 12/31/2018 Time: SLP Start Time (ACUTE ONLY): 1300 -SLP Stop Time (ACUTE ONLY): 1420 SLP Time Calculation (min) (  ACUTE ONLY): 80 min Past Medical History: Past Medical History: Diagnosis Date . Cancer (HCC)   cervical cancer . Diabetes mellitus without complication ([place])  . Hyperlipidemia  . Hypertension  . Knee pain, bilateral  . Neuropathy  . Stroke ([place])  . Underweight  Past Surgical History: Past Surgical History: Procedure Laterality Date . IR IMAGING GUIDED PORT INSERTION  11/05/2018 HPI: 73 year old female presented hospital with shortness of breath and inability to express herself.  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  Per  daughter at baseline patient can walk with a cane but has right-sided weakness from previous stroke.  She usually is able to talk and converse. There are no prior SLP notes in chart  No data recorded Assessment / Plan / Recommendation CHL IP CLINICAL IMPRESSIONS 12/31/2018 Clinical Impression Pt demonstrates a mild to moderate oral dysphagia due to missing dentition and right CN VII weakenss and sensory deficit. There is prolonged mastication with oral residual. There is intermittent anterior spillage with most bites and sips. Occasional premature spillage also occurs, but no penetration or aspiration was observed. Will initiate a dysphagia 1 (puree) and thin liquid diet With postential to advance solids if tolerated at bedside without too much struggle or pocketing.  SLP Visit Diagnosis Dysphagia, oral phase (R13.11) Attention and concentration deficit following -- Frontal lobe and executive function deficit following -- Impact on safety and function --   CHL IP TREATMENT RECOMMENDATION 12/31/2018 Treatment Recommendations Defer until completion of intrumental exam   No flowsheet data found. CHL IP DIET RECOMMENDATION 12/31/2018 SLP Diet Recommendations Dysphagia 1 (Puree) solids;Thin liquid Liquid Administration via Cup;Straw Medication Administration Whole meds with puree Compensations Minimize environmental distractions;Monitor for anterior loss;Lingual sweep for clearance of pocketing;Follow solids with liquid Postural Changes Remain semi-upright after after feeds/meals (Comment)   CHL IP OTHER RECOMMENDATIONS 12/31/2018 Recommended Consults -- Oral Care Recommendations Oral care BID Other Recommendations --   CHL IP FOLLOW UP RECOMMENDATIONS 12/31/2018 Follow up Recommendations 24 hour supervision/assistance   CHL IP FREQUENCY AND DURATION 12/31/2018 Speech Therapy Frequency (ACUTE ONLY) min 2x/week Treatment Duration --      CHL IP ORAL PHASE 12/31/2018 Oral Phase Impaired Oral - Pudding Teaspoon -- Oral - Pudding  Cup -- Oral - Honey Teaspoon -- Oral - Honey Cup -- Oral - Nectar Teaspoon -- Oral - Nectar Cup Right anterior bolus loss;Right pocketing in lateral sulci Oral - Nectar Straw Right anterior bolus loss Oral - Thin Teaspoon -- Oral - Thin Cup Right anterior bolus loss Oral - Thin Straw Right anterior bolus loss Oral - Puree Right pocketing in lateral sulci Oral - Mech Soft Right pocketing in lateral sulci;Right anterior bolus loss Oral - Regular -- Oral - Multi-Consistency -- Oral - Pill -- Oral Phase - Comment --  CHL IP PHARYNGEAL PHASE 12/31/2018 Pharyngeal Phase WFL Pharyngeal- Pudding Teaspoon -- Pharyngeal -- Pharyngeal- Pudding Cup -- Pharyngeal -- Pharyngeal- Honey Teaspoon -- Pharyngeal -- Pharyngeal- Honey Cup -- Pharyngeal -- Pharyngeal- Nectar Teaspoon -- Pharyngeal -- Pharyngeal- Nectar Cup -- Pharyngeal -- Pharyngeal- Nectar Straw -- Pharyngeal -- Pharyngeal- Thin Teaspoon -- Pharyngeal -- Pharyngeal- Thin Cup -- Pharyngeal -- Pharyngeal- Thin Straw -- Pharyngeal -- Pharyngeal- Puree -- Pharyngeal -- Pharyngeal- Mechanical Soft -- Pharyngeal -- Pharyngeal- Regular -- Pharyngeal -- Pharyngeal- Multi-consistency -- Pharyngeal -- Pharyngeal- Pill -- Pharyngeal -- Pharyngeal Comment --  No flowsheet data found. Herbie Baltimore, MA CCC-SLP  Acute Rehabilitation Services Pager [phone] Office [phone] Lynann Beaver 12/31/2018, 2:08 PM              Vas US Carotid  Result Date: 12/30/2018 Carotid Arterial Duplex Study Indications: TIA. Performing Technologist: Oliver Hum RVT  Examination Guidelines: A complete evaluation includes B-mode imaging, spectral Doppler, color Doppler, and power Doppler as needed of all accessible portions of each vessel. Bilateral testing is considered an integral part of a complete examination. Limited examinations for reoccurring indications may be performed as noted.  Right Carotid Findings:  +----------+--------+--------+--------+-----------------------+--------+           PSV cm/sEDV cm/sStenosisDescribe               Comments +----------+--------+--------+--------+-----------------------+--------+ CCA Prox  62      8               smooth and heterogenous         +----------+--------+--------+--------+-----------------------+--------+ CCA Distal68      16              smooth and heterogenous         +----------+--------+--------+--------+-----------------------+--------+ ICA Prox  122     47              smooth and heterogenoustortuous +----------+--------+--------+--------+-----------------------+--------+ ICA Distal62      21                                     tortuous +----------+--------+--------+--------+-----------------------+--------+ ECA       115     9                                               +----------+--------+--------+--------+-----------------------+--------+ +----------+--------+-------+--------+-------------------+           PSV cm/sEDV cmsDescribeArm Pressure (mmHG) +----------+--------+-------+--------+-------------------+ [id number]                                        +----------+--------+-------+--------+-------------------+ +---------+--------+--+--------+--+---------+ VertebralPSV cm/s60EDV cm/s17Antegrade +---------+--------+--+--------+--+---------+  Left Carotid Findings: +----------+--------+-------+--------+--------------------------------+--------+           PSV cm/sEDV    StenosisDescribe                        Comments                   cm/s                                                    +----------+--------+-------+--------+--------------------------------+--------+ CCA Prox  80      13             smooth and heterogenous                  +----------+--------+-------+--------+--------------------------------+--------+ CCA Distal48      10             smooth and  heterogenous                  +----------+--------+-------+--------+--------------------------------+--------+ ICA Prox  74      13  smooth, heterogenous and                                                  calcific                                 +----------+--------+-------+--------+--------------------------------+--------+ ICA Distal41      16                                             tortuous +----------+--------+-------+--------+--------------------------------+--------+ ECA       179     20                                                      +----------+--------+-------+--------+--------------------------------+--------+ +----------+--------+--------+--------+-------------------+ SubclavianPSV cm/sEDV cm/sDescribeArm Pressure (mmHG) +----------+--------+--------+--------+-------------------+           158                                         +----------+--------+--------+--------+-------------------+ +---------+--------+--+--------+--+---------+ VertebralPSV cm/s60EDV cm/s22Antegrade +---------+--------+--+--------+--+---------+  Summary: Right Carotid: Velocities in the right ICA are consistent with a 40-59%                stenosis. Left Carotid: Velocities in the left ICA are consistent with a 1-39% stenosis. Vertebrals: Bilateral vertebral arteries demonstrate antegrade flow. *See table(s) above for measurements and observations.  Electronically signed by Antony Contras MD on 12/30/2018 at 12:25:45 PM.    Final    Vas [place] Transcranial Doppler  Result Date: 01/01/2019  Transcranial Doppler Indications: Stroke. Limitations for diagnostic windows: Unable to insonate right transtemporal window. Unable to insonate left transtemporal window. Performing Technologist: June Leap RDMS, RVT  Examination Guidelines: A complete evaluation includes B-mode imaging, spectral Doppler, color Doppler, and power Doppler as needed of all accessible portions of  each vessel. Bilateral testing is considered an integral part of a complete examination. Limited examinations for reoccurring indications may be performed as noted.  +----------+-------------+----------+-----------+------------------------------+ RIGHT TCD Right VM (cm)Depth (cm)Pulsatility           Comment             +----------+-------------+----------+-----------+------------------------------+ Opthalmic     12.00                 1.52                                   +----------+-------------+----------+-----------+------------------------------+ ICA siphon   148.00                 1.43      difficult to obtain due to  patient movement        +----------+-------------+----------+-----------+------------------------------+ Vertebral    -61.00                 1.61                                   +----------+-------------+----------+-----------+------------------------------+  +----------+------------+----------+-----------+-----------------------------+ LEFT TCD  Left VM (cm)Depth (cm)Pulsatility           Comment            +----------+------------+----------+-----------+-----------------------------+ Opthalmic    65.00                         Possible retrograde opthalmic +----------+------------+----------+-----------+-----------------------------+ ICA siphon   100.00                  1                                   +----------+------------+----------+-----------+-----------------------------+ Vertebral    -42.00                0.98                                  +----------+------------+----------+-----------+-----------------------------+  +------------+-------+-------+             VM cm/sComment +------------+-------+-------+ Prox Basilar-78.00         +------------+-------+-------+ Dist Basilar-60.00         +------------+-------+-------+ Summary:  Absent bitemporal windowslimits  evaluation of anterior circulation.Mildly elevated bilateral carotid siphon,vertebrals and basilar artery mean flow velocities of unclear significance *See table(s) above for measurements and observations.  Diagnosing physician: Antony Contras MD Electronically signed by Antony Contras MD on 01/01/2019 at 8:10:49 AM.    Final       Subjective: Per nursing, no acute events ON.  Discharge Exam: Vitals:   01/05/19 1145 01/05/19 1600  BP: 134/60 130/60  Pulse: 83 80  Resp: 20 18  Temp: 98.3 F (36.8 C) 98.4 F (36.9 C)  SpO2: 100%    Vitals:   01/05/19 0413 01/05/19 0751 01/05/19 1145 01/05/19 1600  BP: (!) 148/66 (!) 149/68 134/60 130/60  Pulse: 88 88 83 80  Resp: 20 18 20 18   Temp: 98.4 F (36.9 C) 98.9 F (37.2 C) 98.3 F (36.8 C) 98.4 F (36.9 C)  TempSrc: Oral Oral Oral   SpO2: 100% 100% 100%   Weight:      Height:       Physical Exam General:72 y.o.femaleresting in bed in NAD Cardiovascular: RRR, +S1, S2, no m/g/r, equal pulses throughout Respiratory:Upper airway transmission,soft rhonchi noted b/l, normal WOB GI: BS+, NDNT, no masses noted, no organomegaly noted MSK: No e/c/c Skin: No rashes, bruises, ulcerations noted Neuro:expressive aphasic, follows some commands, right hemiplegia    The results of significant diagnostics from this hospitalization (including imaging, microbiology, ancillary and laboratory) are listed below for reference.     Microbiology: Recent Results (from the past 240 hour(s))  Blood Culture (routine x 2)     Status: None   Collection Time: 12/29/18  1:36 PM  Result Value Ref Range Status   Specimen Description   Final    BLOOD LEFT ANTECUBITAL Performed at [facility], [place] [address] [place], [place] [address]  Special Requests   Final    BOTTLES DRAWN AEROBIC AND ANAEROBIC Blood Culture adequate volume Performed at [facility] [address], [place], [place] [address]    Culture   Final    NO  GROWTH 5 DAYS Performed at [facility], [place] [address], [place], [place] [address]    Report Status 01/03/2019 FINAL  Final  Blood Culture (routine x 2)     Status: None   Collection Time: 12/29/18  1:52 PM  Result Value Ref Range Status   Specimen Description   Final    BLOOD RIGHT ANTECUBITAL Performed at [facility], [place] [address], [place], [place] [address]    Special Requests   Final    BOTTLES DRAWN AEROBIC AND ANAEROBIC Blood Culture results may not be optimal due to an excessive volume of blood received in culture bottles Performed at [place] [address], [place], [place] [address]    Culture   Final    NO GROWTH 5 DAYS Performed at [facility], [place] [address], [place],  [address]    Report Status 01/03/2019 FINAL  Final  SARS Coronavirus 2 (CEPHEID- Performed in [place] hospital lab), Hosp Order     Status: None   Collection Time: 12/29/18  2:40 PM  Result Value Ref Range Status   SARS Coronavirus 2 NEGATIVE NEGATIVE Final    Comment: (NOTE) If result is NEGATIVE SARS-CoV-2 target nucleic acids are NOT DETECTED. The SARS-CoV-2 RNA is generally detectable in upper and lower  respiratory specimens during the acute phase of infection. The lowest  concentration of SARS-CoV-2 viral copies this assay can detect is 250  copies / mL. A negative result does not preclude SARS-CoV-2 infection  and should not be used as the sole basis for treatment or other  patient management decisions.  A negative result may occur with  improper specimen collection / handling, submission of specimen other  than nasopharyngeal swab, presence of viral mutation(s) within the  areas targeted by this assay, and inadequate number of viral copies  (<250 copies / mL). A negative result must be combined with clinical  observations, patient history, and epidemiological information. If result is POSITIVE SARS-CoV-2 target nucleic acids are  DETECTED. The SARS-CoV-2 RNA is generally detectable in upper and lower  respiratory specimens dur ing the acute phase of infection.  Positive  results are indicative of active infection with SARS-CoV-2.  Clinical  correlation with patient history and other diagnostic information is  necessary to determine patient infection status.  Positive results do  not rule out bacterial infection or co-infection with other viruses. If result is PRESUMPTIVE POSTIVE SARS-CoV-2 nucleic acids MAY BE PRESENT.   A presumptive positive result was obtained on the submitted specimen  and confirmed on repeat testing.  While 2019 novel coronavirus  (SARS-CoV-2) nucleic acids may be present in the submitted sample  additional confirmatory testing may be necessary for epidemiological  and / or clinical management purposes  to differentiate between  SARS-CoV-2 and other Sarbecovirus currently known to infect humans.  If clinically indicated additional testing with an alternate test  methodology [phone]) is advised. The SARS-CoV-2 RNA is generally  detectable in upper and lower respiratory sp ecimens during the acute  phase of infection. The expected result is Negative. Fact Sheet for Patients:  StrictlyIdeas.no Fact Sheet for Healthcare Providers: BankingDealers.co.za This test is not yet approved or cleared by the [place] FDA and has  been authorized for detection and/or diagnosis of SARS-CoV-2 by FDA under an Emergency Use Authorization (EUA).  This EUA will remain in effect (meaning this test can be used) for the duration of the COVID-19 declaration under Section 564(b)(1) of the Act, 21 U.S.C. section 360bbb-3(b)(1), unless the authorization is terminated or revoked sooner. Performed at [facility], [place] [address] [place] [address], [place], [place] [address]   MRSA PCR Screening     Status: None   Collection Time: 12/29/18  5:23 PM  Result Value  Ref Range Status   MRSA by PCR NEGATIVE NEGATIVE Final    Comment:        The GeneXpert MRSA Assay (FDA approved for NASAL specimens only), is one component of a comprehensive MRSA colonization surveillance program. It is not intended to diagnose MRSA infection nor to guide or monitor treatment for MRSA infections. Performed at [facility], [place] [address], [place], [place] [address]   Urine Culture     Status: None   Collection Time: 01/01/19  8:57 AM  Result Value Ref Range Status   Specimen Description URINE, CLEAN CATCH  Final   Special Requests NONE  Final   Culture   Final    NO GROWTH Performed at [facility], [address], [place], [place] [address]    Report Status 01/02/2019 FINAL  Final     Labs: BNP (last 3 results) No results for input(s): BNP in the last 8760 hours. Basic Metabolic Panel: Recent Labs  Lab 12/31/18 0436 01/01/19 0417 01/02/19 0620 01/03/19 0500 01/04/19 1240 01/05/19 0551  NA 132*  --  133* 134* 130* 131*  K 3.6  --  3.3* 3.1* 3.5 3.6  CL 99  --  101 101 96* 99  CO2 23  --  26 25 25 26   GLUCOSE 71  --  147* 119* 169* 131*  BUN 5*  --  11 11 9 11   CREATININE 0.56 0.50 0.45 0.41*  0.39* 0.36* 0.36*  CALCIUM 8.3*  --  8.1* 8.3* 8.5* 8.4*  MG  --   --   --  1.2*  --  1.2*  PHOS  --   --  2.6 2.7 3.1 3.3   Liver Function Tests: Recent Labs  Lab 12/30/18 0639 01/02/19 0620 01/03/19 0500 01/04/19 1240 01/05/19 0551  AST 23  --   --   --   --   ALT 16  --   --   --   --   ALKPHOS 61  --   --   --   --   BILITOT 1.1  --   --   --   --   PROT 6.5  --   --   --   --   ALBUMIN 2.4* 1.9* 1.8* 1.9* 1.8*   No results for input(s): LIPASE, AMYLASE in the last 168 hours. No results for input(s): AMMONIA in the last 168 hours. CBC: Recent Labs  Lab 12/31/18 0436 01/02/19 0620 01/03/19 0500 01/04/19 1240 01/05/19 0551  WBC 8.6 15.7* 13.3* 15.6* 13.5*  NEUTROABS 6.9 11.3* 9.1* 11.8* 9.5*  HGB 8.5* 8.4*  8.1* 8.6* 8.0*  HCT 26.0* 26.7* 25.4* 26.5* 24.4*  MCV 92.5 92.7 94.1 92.3 93.1  PLT 298 393 389 394 357   Cardiac Enzymes: No results for input(s): CKTOTAL, CKMB, CKMBINDEX, TROPONINI in the last 168 hours. BNP: Invalid input(s): POCBNP CBG: Recent Labs  Lab 01/04/19 1145 01/04/19 1615 01/04/19 2136 01/05/19 0624 01/05/19 1225  GLUCAP 167* 165* 122* 132* 166*   D-Dimer No results for input(s): DDIMER in the last 72 hours. Hgb A1c No results for input(s): HGBA1C in the last 72 hours. Lipid Profile No results for input(s): CHOL, HDL, LDLCALC, TRIG, CHOLHDL, LDLDIRECT in the last 72 hours. Thyroid function studies No results for input(s): TSH, T4TOTAL, T3FREE, THYROIDAB in the last 72 hours.  Invalid input(s): FREET3 Anemia work up No results for input(s): VITAMINB12, FOLATE, FERRITIN, TIBC, IRON, RETICCTPCT in the last 72 hours. Urinalysis    Component Value Date/Time   COLORURINE YELLOW 01/01/2019 0909   APPEARANCEUR HAZY (A) 01/01/2019 0909   LABSPEC 1.019 01/01/2019 0909   PHURINE 5.0 01/01/2019 0909   GLUCOSEU NEGATIVE 01/01/2019 0909   HGBUR MODERATE (A) 01/01/2019 0909   BILIRUBINUR NEGATIVE 01/01/2019 0909   KETONESUR 5 (A) 01/01/2019 0909   PROTEINUR 30 (A) 01/01/2019 0909   NITRITE NEGATIVE 01/01/2019 0909   LEUKOCYTESUR LARGE (A) 01/01/2019 0909   Sepsis Labs Invalid input(s): PROCALCITONIN,  WBC,  LACTICIDVEN Microbiology Recent Results (from the past 240 hour(s))  Blood Culture (routine x 2)     Status: None   Collection Time: 12/29/18  1:36 PM  Result Value Ref Range Status   Specimen Description   Final    BLOOD LEFT ANTECUBITAL Performed at [facility], [place] [address] [place], [place] [address]    Special Requests   Final    BOTTLES DRAWN AEROBIC AND ANAEROBIC Blood Culture adequate volume Performed at [place] [address] [place], [place] [address]    Culture   Final    NO GROWTH 5 DAYS Performed at  [facility], [place] [address], [place], [place] [address]    Report Status 01/03/2019 FINAL  Final  Blood Culture (routine x 2)     Status: None   Collection Time: 12/29/18  1:52 PM  Result Value Ref Range Status   Specimen Description   Final    BLOOD RIGHT ANTECUBITAL Performed at [facility], [place] [address], [place], [place] [address]    Special Requests   Final    BOTTLES DRAWN AEROBIC AND ANAEROBIC Blood Culture results may not be optimal due to an excessive volume of blood received in culture bottles Performed at [facility] [address] [place], [place] [address]    Culture   Final    NO GROWTH 5 DAYS Performed at [facility], [place] [address], [place], [place] [address]    Report Status 01/03/2019 FINAL  Final  SARS Coronavirus 2 (CEPHEID- Performed in [facility] hospital lab), Hosp Order     Status: None   Collection Time: 12/29/18  2:40 PM  Result Value Ref Range Status   SARS Coronavirus 2 NEGATIVE NEGATIVE Final    Comment: (NOTE) If result is NEGATIVE SARS-CoV-2 target nucleic acids are NOT DETECTED. The SARS-CoV-2 RNA is generally detectable in upper and lower  respiratory specimens during the acute phase of infection. The lowest  concentration of SARS-CoV-2 viral copies this assay can detect is 250  copies / mL. A negative result does not preclude SARS-CoV-2 infection  and should not be used as the sole basis for treatment or other  patient management decisions.  A negative result may occur with  improper specimen collection / handling, submission of specimen other  than nasopharyngeal swab, presence of viral mutation(s) within the  areas targeted by this assay, and inadequate number of viral copies  (<  250 copies / mL). A negative result must be combined with clinical  observations, patient history, and epidemiological information. If result is POSITIVE SARS-CoV-2 target nucleic acids are DETECTED. The SARS-CoV-2 RNA is  generally detectable in upper and lower  respiratory specimens dur ing the acute phase of infection.  Positive  results are indicative of active infection with SARS-CoV-2.  Clinical  correlation with patient history and other diagnostic information is  necessary to determine patient infection status.  Positive results do  not rule out bacterial infection or co-infection with other viruses. If result is PRESUMPTIVE POSTIVE SARS-CoV-2 nucleic acids MAY BE PRESENT.   A presumptive positive result was obtained on the submitted specimen  and confirmed on repeat testing.  While 2019 novel coronavirus  (SARS-CoV-2) nucleic acids may be present in the submitted sample  additional confirmatory testing may be necessary for epidemiological  and / or clinical management purposes  to differentiate between  SARS-CoV-2 and other Sarbecovirus currently known to infect humans.  If clinically indicated additional testing with an alternate test  methodology [phone]) is advised. The SARS-CoV-2 RNA is generally  detectable in upper and lower respiratory sp ecimens during the acute  phase of infection. The expected result is Negative. Fact Sheet for Patients:  StrictlyIdeas.no Fact Sheet for Healthcare Providers: BankingDealers.co.za This test is not yet approved or cleared by the [place] FDA and has been authorized for detection and/or diagnosis of SARS-CoV-2 by FDA under an Emergency Use Authorization (EUA).  This EUA will remain in effect (meaning this test can be used) for the duration of the COVID-19 declaration under Section 564(b)(1) of the Act, 21 U.S.C. section 360bbb-3(b)(1), unless the authorization is terminated or revoked sooner. Performed at [facility], [place] [address], [place], [place] [address]   MRSA PCR Screening     Status: None   Collection Time: 12/29/18  5:23 PM  Result Value Ref Range Status   MRSA by PCR  NEGATIVE NEGATIVE Final    Comment:        The GeneXpert MRSA Assay (FDA approved for NASAL specimens only), is one component of a comprehensive MRSA colonization surveillance program. It is not intended to diagnose MRSA infection nor to guide or monitor treatment for MRSA infections. Performed at [facility], [place] [address], [place], [place] [address]   Urine Culture     Status: None   Collection Time: 01/01/19  8:57 AM  Result Value Ref Range Status   Specimen Description URINE, CLEAN CATCH  Final   Special Requests NONE  Final   Culture   Final    NO GROWTH Performed at [facility], [address], [place], [place] [address]    Report Status 01/02/2019 FINAL  Final     Time coordinating discharge: 35 minutes spent in the coordination of care today.  SIGNED:   Jonnie Finner, DO  [facility] 01/06/2019, 9:36 AM Pager   If 7PM-7AM, please contact night-coverage [email or web address] Password [email or web address]

## 2019-01-05 NOTE — TOC Progression Note (Signed)
Transition of Care [facility]) - Progression Note    Patient Details  Name: YUDITH NORLANDER MRN: [id number] Date of Birth: 1945-09-27  Transition of Care [facility] CM/SW Contact  Graves-Bigelow, Ocie [place], RN Phone Number: 01/05/2019, 5:01 PM  Clinical Narrative:  CM received notice that patient will need a hospital bed- pt will stay overnight. Referral sent to [facility] with [facility]. Hospital bed will probably not be delivered until 01-06-19. [facility] Pod will [facility] aware that pt will transition home on 01-06-19. CM did speak with daughter and MD regarding disposition plans. Pt will need ambulance transport for home. No further needs from CM at this time.   Expected Discharge Plan: [place] Barriers to Discharge: Continued Medical Work up  Expected Discharge Plan and Services Expected Discharge Plan: [place] In-house Referral: NA Discharge Planning Services: CM Consult   Living arrangements for the past 2 months: Single Family Home Expected Discharge Date: (unknown)               DME Arranged: Hospital bed DME Agency: [facility] Date DME Agency Contacted: 01/05/19 Time DME Agency Contacted: 1700 Representative spoke with at DME Agency: [place]: RN, Disease Management, PT, OT [place] Agency: [facility] Date [facility] Agency Contacted: 01/05/19 Time [place]: 1146 Representative spoke with at [place]: [place] (SDOH) Interventions    Readmission Risk Interventions Readmission Risk Prevention Plan 01/05/2019  Transportation Screening Complete  Medication Review Press photographer) Complete  HRI or [place] Complete  SW Recovery Care/Counseling Consult Complete  Palliative Care Screening Not Applicable  Some recent data might be hidden

## 2019-01-05 NOTE — Care Management Important Message (Signed)
Important Message  Patient Details  Name: Katie Myers MRN: [id number] Date of Birth: [birth year]   Medicare Important Message Given:  Yes    [name] 01/05/2019, 3:30 PM

## 2019-01-05 NOTE — Progress Notes (Signed)
  Speech Language Pathology Treatment: Dysphagia;Cognitive-Linquistic  Patient Details Name: Katie Myers MRN: [id number] DOB: 1946-06-24 Today's Date: 01/05/2019 Time: 1135-1200 SLP Time Calculation (min) (ACUTE ONLY): 25 min  Assessment / Plan / Recommendation Clinical Impression  Patient given trials of cracker and water. She is missing many teeth, however able to chew cracker and successfully swallow and clear oral cavity with a liquid wash. Pt was agreeable to try a Dys 2 diet with chopped/ground consistencies. She continues to need total assist with meals and to follow aspiration precautions. Recommend a liquid wash after solids. Patient was able to follow all single and 2-step commands during session. Her verbal responses were still unintelligible however she was very consistent with yes/no questions and compensates well with gestures to communicate basic needs and wants. Slow progress with verbal expression.    HPI HPI: 73 year old female presented hospital with shortness of breath and inability to express herself.  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  She usually is able to talk and converse. There are no prior SLP notes in chart      SLP Plan  Continue with current plan of care       Recommendations  Diet recommendations: Dysphagia 2 (fine chop);Thin liquid Liquids provided via: Straw;Cup Medication Administration: Crushed with puree Supervision: Staff to assist with self feeding;Full supervision/cueing for compensatory strategies Compensations: Minimize environmental distractions;Monitor for anterior loss;Lingual sweep for clearance of pocketing;Follow solids with liquid Postural Changes and/or Swallow Maneuvers: Seated upright 90 degrees                Oral Care Recommendations: Oral care BID Follow up Recommendations: Inpatient Rehab SLP Visit Diagnosis: Aphasia (R47.01);Dysphagia, oral phase (R13.11) Plan: Continue with current plan of care       [place], MA, CCC-SLP 01/05/2019 12:11 PM

## 2019-01-05 NOTE — Care Management (Addendum)
    Durable Medical Equipment  (From admission, onward)         Start     Ordered   01/05/19 1700  For home use only DME Hospital bed  Once    Comments:  Will need for life.  Question Answer Comment  Patient has (list medical condition): CVA; requires total care   The above medical condition requires: Patient requires the ability to reposition frequently   Head must be elevated greater than: 30 degrees   Bed type Semi-electric      01/05/19 1659         Which cannot be obtained in a normal bed.

## 2019-01-05 NOTE — Progress Notes (Signed)
Katie Myers  PROGRESS NOTE    [name]  TWS:[id number] DOB: 11-27-1945 DOA: 12/29/2018 PCP: [name], MD   Brief Narrative:   Patient is a 73 year old female with history of diabetes type 2, cervical cancer with mets to lungs and lymph node, ischemic stroke, chronic anemia, hypertension who was brought to the emergency department yesterday with complaints of shortness of breath,fever. She was being admitted for pneumonia and while in the emergency department shebecame confused, developed slurred speech and right-sided weakness. MRI of the brain showed acute cortical infarction of anterior left middle cerebral artery territory. She has been started on IV antibiotics for pneumonia. Neurology has been consulted and she has been transferred to [facility] today. She is also being followed by oncology.   Assessment & Plan:   Active Problems:   Cervical cancer ([place])   HCAP (healthcare-associated pneumonia)   Pressure injury of skin   Acute ischemic stroke ([place])   1. Acute ischemic stroke:  - MRI: Acute cortical infarct of the anterior left middle cerebral artery territory. No hemorrhage or mass effect.  - Patient is dysarthric, has slurred speech. She is alert. 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 - Neurology consulted; appreciate assistance. - Spoke with neurology; rec comfort care and no further stroke w/u    - DAPT 3 weeks, then plavix only  2. Neutropenic fever:  - Febrile on presentation.  - Suspected secondary to pneumonia. Follow-up cultures. Afebrile this morning.  - Oncology following - she remains afebrile; source was believed to be PNA; looks like she has a dirty UA as well     - cefepime has been d/c'd  3. Possible healthcare associated pneumonia:  - Immunocompromised patient.  - Chest x-ray finding consistent with infiltrates.  - Covid-19 negative. - d/c cefepime.  4. Hypertension:   - amlodipine, benazepril, coreg, hydralazine  5. Hyponatremia:  - Mild. Monitor BMP  6. History of metastatic cervical cancer - Cervical cancer with mets to lymph nodes and lung.  - per Dr. Alvira Monday will sign off and call her niece a month from now for update. If she has no meaningful improvement, then I think she should be enrolled in hospice  7. Type 2 diabetes mellitus - Continue sliding scale insulin.  - On metformin at home.  8. Hyperlipidemia - On Lipitor at home.  9. Hypokalemia - replete as necessary  10. Chronic normocytic anemia - Most likely secondary to malignancy, chemotherapy.  - s/p 1 unit of pRBCs as per oncology.   DVT prophylaxis: lovenox Code Status: FULL   Disposition Plan: To home with [facility]   Consultants:   Neurology  Oncology   Subjective: Mg2+ low this AM. No acute events ON. Should pain improved  Objective: Vitals:   01/05/19 0413 01/05/19 0751 01/05/19 1145 01/05/19 1600  BP: (!) 148/66 (!) 149/68 134/60 130/60  Pulse: 88 88 83 80  Resp: 20 18 20 18   Temp: 98.4 F (36.9 C) 98.9 F (37.2 C) 98.3 F (36.8 C) 98.4 F (36.9 C)  TempSrc: Oral Oral Oral   SpO2: 100% 100% 100%   Weight:      Height:        Intake/Output Summary (Last 24 hours) at 01/05/2019 1631 Last data filed at 01/05/2019 1023 Gross per 24 hour  Intake 140 ml  Output 800 ml  Net -660 ml   Filed Weights   12/29/18 1331  Weight: 62.6 kg    Examination:  General:72 y.o.femaleresting in bed in  NAD, halfway smiling this morning Cardiovascular: RRR, +S1, S2, no m/g/r, equal pulses throughout Respiratory:Upper airway transmission,soft rhonchi noted b/l, normal WOB GI: BS+, NDNT, no masses noted, no organomegaly noted MSK: No e/c/c Skin: No rashes, bruises, ulcerations noted Neuro:expressive aphasic, follows some commands, right hemiplegia    Data Reviewed: I have personally reviewed following labs and  imaging studies.  CBC: Recent Labs  Lab 12/31/18 0436 01/02/19 0620 01/03/19 0500 01/04/19 1240 01/05/19 0551  WBC 8.6 15.7* 13.3* 15.6* 13.5*  NEUTROABS 6.9 11.3* 9.1* 11.8* 9.5*  HGB 8.5* 8.4* 8.1* 8.6* 8.0*  HCT 26.0* 26.7* 25.4* 26.5* 24.4*  MCV 92.5 92.7 94.1 92.3 93.1  PLT 298 393 389 394 681   Basic Metabolic Panel: Recent Labs  Lab 12/31/18 0436 01/01/19 0417 01/02/19 0620 01/03/19 0500 01/04/19 1240 01/05/19 0551  NA 132*  --  133* 134* 130* 131*  K 3.6  --  3.3* 3.1* 3.5 3.6  CL 99  --  101 101 96* 99  CO2 23  --  26 25 25 26   GLUCOSE 71  --  147* 119* 169* 131*  BUN 5*  --  11 11 9 11   CREATININE 0.56 0.50 0.45 0.41*  0.39* 0.36* 0.36*  CALCIUM 8.3*  --  8.1* 8.3* 8.5* 8.4*  MG  --   --   --  1.2*  --  1.2*  PHOS  --   --  2.6 2.7 3.1 3.3   GFR: Estimated Creatinine Clearance: 56.4 mL/min (A) (by C-G formula based on SCr of 0.36 mg/dL (L)). Liver Function Tests: Recent Labs  Lab 12/30/18 0639 01/02/19 0620 01/03/19 0500 01/04/19 1240 01/05/19 0551  AST 23  --   --   --   --   ALT 16  --   --   --   --   ALKPHOS 61  --   --   --   --   BILITOT 1.1  --   --   --   --   PROT 6.5  --   --   --   --   ALBUMIN 2.4* 1.9* 1.8* 1.9* 1.8*   No results for input(s): LIPASE, AMYLASE in the last 168 hours. No results for input(s): AMMONIA in the last 168 hours. Coagulation Profile: No results for input(s): INR, PROTIME in the last 168 hours. Cardiac Enzymes: No results for input(s): CKTOTAL, CKMB, CKMBINDEX, TROPONINI in the last 168 hours. BNP (last 3 results) No results for input(s): PROBNP in the last 8760 hours. HbA1C: No results for input(s): HGBA1C in the last 72 hours. CBG: Recent Labs  Lab 01/04/19 1145 01/04/19 1615 01/04/19 2136 01/05/19 0624 01/05/19 1225  GLUCAP 167* 165* 122* 132* 166*   Lipid Profile: No results for input(s): CHOL, HDL, LDLCALC, TRIG, CHOLHDL, LDLDIRECT in the last 72 hours. Thyroid Function Tests: No results  for input(s): TSH, T4TOTAL, FREET4, T3FREE, THYROIDAB in the last 72 hours. Anemia Panel: No results for input(s): VITAMINB12, FOLATE, FERRITIN, TIBC, IRON, RETICCTPCT in the last 72 hours. Sepsis Labs: No results for input(s): PROCALCITON, LATICACIDVEN in the last 168 hours.  Recent Results (from the past 240 hour(s))  Blood Culture (routine x 2)     Status: None   Collection Time: 12/29/18  1:36 PM  Result Value Ref Range Status   Specimen Description   Final    BLOOD LEFT ANTECUBITAL Performed at [facility], [place] [address], [place], [place] [address]    Special Requests   Final  BOTTLES DRAWN AEROBIC AND ANAEROBIC Blood Culture adequate volume Performed at [place] [address], [place], [place] [address]    Culture   Final    NO GROWTH 5 DAYS Performed at [facility], [place] [address], [place], [place] [address]    Report Status 01/03/2019 FINAL  Final  Blood Culture (routine x 2)     Status: None   Collection Time: 12/29/18  1:52 PM  Result Value Ref Range Status   Specimen Description   Final    BLOOD RIGHT ANTECUBITAL Performed at [facility], [place] [address] [place], [place] [address]    Special Requests   Final    BOTTLES DRAWN AEROBIC AND ANAEROBIC Blood Culture results may not be optimal due to an excessive volume of blood received in culture bottles Performed at [place] [address], [place], [place] [address]    Culture   Final    NO GROWTH 5 DAYS Performed at [facility], [place] [address], [place], [place] [address]    Report Status 01/03/2019 FINAL  Final  SARS Coronavirus 2 (CEPHEID- Performed in [place] hospital lab), Hosp Order     Status: None   Collection Time: 12/29/18  2:40 PM  Result Value Ref Range Status   SARS Coronavirus 2 NEGATIVE NEGATIVE Final    Comment: (NOTE) If result is NEGATIVE SARS-CoV-2 target nucleic acids are NOT DETECTED. The SARS-CoV-2 RNA is  generally detectable in upper and lower  respiratory specimens during the acute phase of infection. The lowest  concentration of SARS-CoV-2 viral copies this assay can detect is 250  copies / mL. A negative result does not preclude SARS-CoV-2 infection  and should not be used as the sole basis for treatment or other  patient management decisions.  A negative result may occur with  improper specimen collection / handling, submission of specimen other  than nasopharyngeal swab, presence of viral mutation(s) within the  areas targeted by this assay, and inadequate number of viral copies  (<250 copies / mL). A negative result must be combined with clinical  observations, patient history, and epidemiological information. If result is POSITIVE SARS-CoV-2 target nucleic acids are DETECTED. The SARS-CoV-2 RNA is generally detectable in upper and lower  respiratory specimens dur ing the acute phase of infection.  Positive  results are indicative of active infection with SARS-CoV-2.  Clinical  correlation with patient history and other diagnostic information is  necessary to determine patient infection status.  Positive results do  not rule out bacterial infection or co-infection with other viruses. If result is PRESUMPTIVE POSTIVE SARS-CoV-2 nucleic acids MAY BE PRESENT.   A presumptive positive result was obtained on the submitted specimen  and confirmed on repeat testing.  While 2019 novel coronavirus  (SARS-CoV-2) nucleic acids may be present in the submitted sample  additional confirmatory testing may be necessary for epidemiological  and / or clinical management purposes  to differentiate between  SARS-CoV-2 and other Sarbecovirus currently known to infect humans.  If clinically indicated additional testing with an alternate test  methodology [phone]) is advised. The SARS-CoV-2 RNA is generally  detectable in upper and lower respiratory sp ecimens during the acute  phase of infection.  The expected result is Negative. Fact Sheet for Patients:  StrictlyIdeas.no Fact Sheet for Healthcare Providers: BankingDealers.co.za This test is not yet approved or cleared by the [place] FDA and has been authorized for detection and/or diagnosis of SARS-CoV-2  by FDA under an Emergency Use Authorization (EUA).  This EUA will remain in effect (meaning this test can be used) for the duration of the COVID-19 declaration under Section 564(b)(1) of the Act, 21 U.S.C. section 360bbb-3(b)(1), unless the authorization is terminated or revoked sooner. Performed at [facility], [place] [address], [place], [place] [address]   MRSA PCR Screening     Status: None   Collection Time: 12/29/18  5:23 PM  Result Value Ref Range Status   MRSA by PCR NEGATIVE NEGATIVE Final    Comment:        The GeneXpert MRSA Assay (FDA approved for NASAL specimens only), is one component of a comprehensive MRSA colonization surveillance program. It is not intended to diagnose MRSA infection nor to guide or monitor treatment for MRSA infections. Performed at [facility], [place] [address] [place], [place] [address]   Urine Culture     Status: None   Collection Time: 01/01/19  8:57 AM  Result Value Ref Range Status   Specimen Description URINE, CLEAN CATCH  Final   Special Requests NONE  Final   Culture   Final    NO GROWTH Performed at [facility], [address], [place],  [address]    Report Status 01/02/2019 FINAL  Final      Radiology Studies: No results found.      Scheduled Meds: . sodium chloride   Intravenous Once  . acetaminophen  650 mg Oral Once  . amLODipine  10 mg Oral Daily  . aspirin EC  81 mg Oral Daily  . atorvastatin  40 mg Oral Daily  . benazepril  40 mg Oral Daily  . carvedilol  12.5 mg Oral BID WC  . clopidogrel  75 mg Oral Daily  . diphenhydrAMINE  25 mg Intravenous  Once  . enoxaparin (LOVENOX) injection  40 mg Subcutaneous Q24H  . feeding supplement (ENSURE ENLIVE)  237 mL Oral BID BM  . hydrALAZINE  25 mg Oral Q8H  . insulin aspart  0-5 Units Subcutaneous QHS  . insulin aspart  0-9 Units Subcutaneous TID WC  . magnesium oxide  400 mg Oral BID  . polyethylene glycol  17 g Oral Daily  . sodium chloride flush  10-40 mL Intracatheter Q12H   Continuous Infusions:   LOS: 7 days    Time spent: 35 minutes spent in the coordination of care today.    Jonnie Finner, DO [facility] Pager [phone]  If 7PM-7AM, please contact night-coverage [email or web address] Password [facility] 01/05/2019, 4:31 PM

## 2019-01-06 LAB — GLUCOSE, CAPILLARY
Glucose-Capillary: 131 mg/dL — ABNORMAL HIGH (ref 70–99)
Glucose-Capillary: 203 mg/dL — ABNORMAL HIGH (ref 70–99)
Glucose-Capillary: 198 mg/dL — ABNORMAL HIGH (ref 70–99)

## 2019-01-06 MED ORDER — HEPARIN SOD (PORK) LOCK FLUSH 100 UNIT/ML IV SOLN: 500.0000 [IU] | INTRAVENOUS | Status: DC | PRN

## 2019-01-06 NOTE — Progress Notes (Signed)
Pt was earlier D/c, waiting on PTAR, came up to pick pt at 1900, pt cleaned up and was taken home at 2000, family (daughter) called and notified that pt was coming home, was however reassured. Obasogie-Asidi, Zivah Mayr Efe

## 2019-01-06 NOTE — Progress Notes (Signed)
NURSING PROGRESS NOTE  SAIGE CANTON [id number] Discharge Data: 01/06/2019 3:21 PM Attending Provider: Jonnie Finner, DO YTR:ZNBVA, Ebony Hail, MD     [name] to be D/C'd Home per MD order.  Discussed with the patient the After Visit Summary and all questions fully answered. All IV's discontinued with no bleeding noted. All belongings returned to patient for patient to take home.   Last Vital Signs:  Blood pressure 136/63, pulse 87, temperature 99.8 F (37.7 C), temperature source Oral, resp. rate 17, height 5\' 5"  (1.651 m), weight 70 kg, SpO2 98 %.  Discharge Medication List

## 2019-01-06 NOTE — Progress Notes (Signed)
Hydrologist [facility])  Hospital Liaison: RN note    Notified by Rexene Alberts, CSW of patient/family request for [facility] Palliative services at home after discharge.    Writer spoke with daughter [name] to explain services. [facility] palliative team will follow up with patient and family after discharge. Per discussion, plan is for discharge to home by by PTAR today.     Please call with any palliative related questions.     Thank you for this referral.     Farrel Gordon, RN, CCM  [place] (listed on AMION under Hospice and [place] [facility] [place])  [phone]

## 2019-01-06 NOTE — Progress Notes (Signed)
  Speech Language Pathology Treatment: Cognitive-Linquistic(Aphasia)  Patient Details Name: Katie Myers MRN: [id number] DOB: Aug 29, 1945 Today's Date: 01/06/2019 Time: 5277-8242 SLP Time Calculation (min) (ACUTE ONLY): 15 min  Assessment / Plan / Recommendation Clinical Impression  Pt was seen for aphasia treatment and was cooperative throughout the session. She was able to vocalize the melody of the Happy Birthday song in unison with the SLP and she approximate production of some of the lyrics but continues to demonstrate difficulty with other automatic sequences despite max cues. She demonstrated difficulty following commands during structured tasks, and achieved 0% accuracy with completion of 2-step commands despite prompts. She demonstrated 40% accuracy with complex yes/no questions but demonstrated increased accuracy with simple questions. Perseveration was noted with "yes" but she also provided some "no" responses. She achieved 20% accuracy with picture identification from a field of two and consistently identified the item on the left. SLP will continue to follow.    HPI HPI: 73 year old female presented hospital with shortness of breath and inability to express herself.  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  She usually is able to talk and converse. There are no prior SLP notes in chart      SLP Plan  Continue with current plan of care       Recommendations                   Follow up Recommendations: Inpatient Rehab SLP Visit Diagnosis: Aphasia (R47.01) Plan: Continue with current plan of care       Randa Riss I. Hardin Negus, [place], [place] Office number [phone] Pager [phone]               Horton Marshall 01/06/2019, 2:31 PM

## 2019-01-06 NOTE — TOC Transition Note (Signed)
Transition of Care [facility]) - CM/SW Discharge Note   Patient Details  Name: Katie Myers MRN: [id number] Date of Birth: 1945-10-05  [facility]) CM/SW Contact:  Pollie Friar, RN Phone Number: 01/06/2019, 2:46 PM   Clinical Narrative:    Pt discharging home today. [place] set up with [facility] and hospital bed delivered to the home. Family requesting PTAR home. CSW to call and have this arranged.    Final next level of care: [place] Barriers to Discharge: Barriers Resolved   Patient Goals and CMS Choice Patient states their goals for this hospitalization and ongoing recovery are:: Patient not fully oriented.      Discharge Placement                       Discharge Plan and Services In-house Referral: NA Discharge Planning Services: CM Consult Post Acute Care Choice: Durable Medical Equipment, Home Health          DME Arranged: Hospital bed DME Agency: [employer] Date DME Agency Contacted: 01/05/19 Time DME Agency Contacted: 1700 Representative spoke with at DME Agency: [place]: RN, Disease Management, PT, OT [place] Agency: [facility] Date [place]: 01/05/19 Time [place]: 1146 Representative spoke with at [place]: [place] (SDOH) Interventions     Readmission Risk Interventions Readmission Risk Prevention Plan 01/05/2019  Transportation Screening Complete  Medication Review Press photographer) Complete  HRI or [place] Complete  SW Recovery Care/Counseling Consult Complete  Palliative Care Screening Not Applicable  Some recent data might be hidden

## 2019-01-07 ENCOUNTER — Telehealth: Payer: Self-pay | Admitting: Family Medicine

## 2019-01-07 ENCOUNTER — Telehealth: Payer: Self-pay

## 2019-01-07 ENCOUNTER — Other Ambulatory Visit: Payer: Medicare HMO

## 2019-01-07 ENCOUNTER — Ambulatory Visit: Payer: Medicare HMO | Admitting: Hematology and Oncology

## 2019-01-07 ENCOUNTER — Other Ambulatory Visit: Payer: Self-pay | Admitting: Family Medicine

## 2019-01-07 MED ORDER — METFORMIN HCL 500 MG PO TABS: 500.0000 mg | ORAL_TABLET | Freq: Every day | ORAL | 3 refills | Status: AC

## 2019-01-07 NOTE — Telephone Encounter (Signed)
Refilled. Also please make sure she has hospital f/u scheduled within one week.  [name], MD [place]

## 2019-01-07 NOTE — Telephone Encounter (Signed)
Spoke with Mortimer Fries @ [facility] (Palliative Care)   Palliative Care to go out to patient's home tomorrow 5/21 @ 11 am for initial evaluation.  Patient's niece, [name] aware.

## 2019-01-07 NOTE — Telephone Encounter (Signed)
LM for patient to return call for TCM. 

## 2019-01-07 NOTE — Telephone Encounter (Signed)
Copied from [place] [id number]. Topic: Quick Communication - See Telephone Encounter >> Jan 07, 2019 10:18 AM Blase Mess A wrote: CRM for notification. See Telephone encounter for: 01/07/19. Jilda Panda is calling to let Dr. Rogers Blocker know that the patient will be under their care of [facility]. [phone]

## 2019-01-07 NOTE — Telephone Encounter (Signed)
jen can you find out what this is? Home health?

## 2019-01-07 NOTE — Telephone Encounter (Signed)
Copied from [place] [id number]. Topic: Quick Communication - Rx Refill/Question >> Jan 07, 2019  9:07 AM Celene Kras A wrote: Medication: metFORMIN (GLUCOPHAGE) 500 MG tablet  Has the patient contacted their pharmacy? No. Pts granddaughter called regarding medication. She is requesting a call back when this has been taken care of. Please advise. [phone] (Agent: If no, request that the patient contact the pharmacy for the refill.) (Agent: If yes, when and what did the pharmacy advise?)  Preferred Pharmacy (with phone number or street name): [facility] [address] [place] [address] Phone: [phone] Fax: [phone] Not a 24 hour pharmacy; exact hours not known.    Agent: Please be advised that RX refills may take up to 3 business days. We ask that you follow-up with your pharmacy.

## 2019-01-07 NOTE — Telephone Encounter (Signed)
pls see message  FYI

## 2019-01-07 NOTE — Telephone Encounter (Signed)
See note

## 2019-01-07 NOTE — Telephone Encounter (Signed)
Due to the current COVID-19 infection/crises, the patient and family prefer, and have given their verbal consent for, a provider visit via telemedicine. HIPPA policies of confidentially were discussed and patient/family expressed understanding.Daughter, [name], asked for granddaughter, [name]., to also join in the conversation. Visit scheduled for 01/08/2019

## 2019-01-08 ENCOUNTER — Other Ambulatory Visit: Payer: Medicare HMO | Admitting: Adult Health Nurse Practitioner

## 2019-01-08 ENCOUNTER — Ambulatory Visit: Payer: Self-pay

## 2019-01-08 ENCOUNTER — Telehealth: Payer: Self-pay

## 2019-01-08 ENCOUNTER — Telehealth: Payer: Self-pay | Admitting: Family Medicine

## 2019-01-08 ENCOUNTER — Other Ambulatory Visit: Payer: Self-pay

## 2019-01-08 DIAGNOSIS — Z515 Encounter for palliative care: Secondary | ICD-10-CM

## 2019-01-08 NOTE — Telephone Encounter (Signed)
See note

## 2019-01-08 NOTE — Telephone Encounter (Signed)
Okay to give verbal orders for home health. I will do DME order as well.

## 2019-01-08 NOTE — Telephone Encounter (Signed)
Spoke to [employer] and gave verbal orders for OT/PT/Skilled nursing/ST  See earlier msg from 5/21.

## 2019-01-08 NOTE — Progress Notes (Signed)
[facility] Consult Note Telephone: [phone]  Fax: [phone]  PATIENT NAME: Katie Myers DOB: 02-26-1946 MRN: [id number]  PRIMARY CARE PROVIDER:   [name], MD  REFERRING PROVIDER:  [name], MD [place], [place] [address]  RESPONSIBLE PARTY:   Phillips Hay, daughter [phone]  Due to the COVID-19 crisis, this visit was done via telemedicine and it was initiated and consent by this patient and or family. Video-audio (telehealth) contact was unable to be done due to technical barriers from the patient's side.       RECOMMENDATIONS and PLAN:  1.  CVA.  Patient had recent hospitalization (5/11-5/18/2020) for pneumonia and while still in the ER had a CVA.  She has right sided hemiplegia and expressive aphasia as a result.  Patient prior to CVA was independent and balancing her own checkbook. She was able to walk with assistance due to pain in her knees.  Now she is total care and bedridden.  Granddaughter states that if she had a [place] lift that she would be able to sit her up in the recliner but right now is not able to transfer her.  She is able to swallow pills whole in applesauce and her appetite has been normal.  States that she is able to swallow without coughing or choking as long as she sits up. Does state that she is able to clear her throat.  Patient has PT/OT services that will be coming to the home.  Have instructed granddaughter to ask about equipment such as a lift to help in the home.  2.  Pain.  Does state that she has pain when providing care for her grandmother.  Was given some tramadol upon discharge from hospital.  States that her grandmother appears to get pain relief when she gives her half a tramadol.  Was only given 10 pills upon discharge, would recommend continuing this as the patient does have pain and gets relief with the tramadol.    3.  Decubitus ulcers.  Patient has pressure wounds  to sacrum and back of one of her legs.  Granddaughter states that she came home with a suction device in place to be changed every 3 days.  Since they just got home day before yesterday, this has not been changed yet.  Does state that she has a wound nurse that will be coming to take care of the wound.    4.  Goals of care.  Patient is full code.  Granddaughter states that family is in agreement to have all interventions done including CPR and ventilator in the case she needs to go to the hospital.  Does state that family is on board with hospice when patient gets to that point.  Patient has cervical cancer with mets to lungs and lymph nodes that she is currently not being treated for.  Notes in Epic suggest that patient was to be reevaluated in one month for disease progression with possible hospice referral at that time. Granddaughter stated needing some help with an aide that could help with things like bathing.  Will reach out to SW to call family with resources.  Granddaughter also interested in PACE program.  States that she would like her grandmother to be involved with activities other than sitting at home and watching TV.   I spent 30 minutes providing this consultation,  from 11:00 to 12:00. More than 50% of the time in this consultation was spent coordinating communication.  HISTORY OF PRESENT ILLNESS:  Katie Myers is a 73 y.o. 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 Palliative Care was asked to help address goals of care.   CODE STATUS: Full code  PPS: 30% HOSPICE ELIGIBILITY/DIAGNOSIS: TBD  PAST MEDICAL HISTORY:  Past Medical History:  Diagnosis Date  . Cancer (HCC)    cervical cancer  . Diabetes mellitus without complication ([place])   . Hyperlipidemia   . Hypertension   . Knee pain, bilateral   . Neuropathy   . Stroke ([place])   .  Underweight     SOCIAL HX:  Social History   Tobacco Use  . Smoking status: Former Smoker    Packs/day: 0.50    Types: Cigarettes  . Smokeless tobacco: Never Used  . Tobacco comment: last smoke week 3/10  Substance Use Topics  . Alcohol use: Never    Frequency: Never    ALLERGIES: No Known Allergies   PERTINENT MEDICATIONS:  Outpatient Encounter Medications as of 01/08/2019  Medication Sig  . amLODipine (NORVASC) 10 MG tablet Take 1 tablet (10 mg total) by mouth daily.  Marland Kitchen aspirin EC 81 MG EC tablet Take 1 tablet (81 mg total) by mouth daily for 17 days.  Marland Kitchen atorvastatin (LIPITOR) 40 MG tablet Take 40 mg by mouth daily.  . benazepril (LOTENSIN) 40 MG tablet Take 40 mg by mouth daily.  . carvedilol (COREG) 12.5 MG tablet Take 1 tablet (12.5 mg total) by mouth 2 (two) times daily with a meal.  . clopidogrel (PLAVIX) 75 MG tablet Take 75 mg by mouth daily.   . hydrALAZINE (APRESOLINE) 25 MG tablet Take 1 tablet (25 mg total) by mouth every 8 (eight) hours for 30 days.  Marland Kitchen lidocaine-prilocaine (EMLA) cream Apply to affected area once (Patient taking differently: Apply 1 application topically See admin instructions. Apply to affected area the night prior to chemo treatments)  . metFORMIN (GLUCOPHAGE) 500 MG tablet Take 1 tablet (500 mg total) by mouth daily.  . Multiple Vitamins-Minerals (MULTIVITAMIN WITH MINERALS) tablet Take 1 tablet by mouth daily.  . ondansetron (ZOFRAN) 8 MG tablet Take 1 tablet (8 mg total) by mouth every 8 (eight) hours as needed for refractory nausea / vomiting. Start on day 3 after chemo.  . polyethylene glycol powder (GLYCOLAX/MIRALAX) 17 GM/SCOOP powder Take 17 g by mouth daily as needed for mild constipation or moderate constipation.  . senna (SENOKOT) 8.6 MG tablet Take 2-3 tablets by mouth daily as needed for constipation.   . traMADol (ULTRAM) 50 MG tablet Take 1 tablet (50 mg total) by mouth every 12 (twelve) hours as needed for up to 3 days for moderate pain  or severe pain.  . vitamin B-12 (CYANOCOBALAMIN) 1000 MCG tablet Take 1,000 mcg by mouth daily.   No facility-administered encounter medications on file as of 01/08/2019.       Clearence Vitug Jenetta Downer, NP

## 2019-01-08 NOTE — Telephone Encounter (Signed)
Pt granddaughter called to ask for an order for O2 for home use.  She states that her grandmother has recently had a stroke and was sent home without O2 but required it in transport to her home.  She states her Grandmother had pneumonia.  She states she is fine at rest but when she positions and move her around she becomes SOB She checks he O2 saturations at home and states that they are 98-100%. She has had a high Blood glucose of 301 yesterday but it is down today.  She states she missed her Metformin yesterday. Per protocol message will be sent to office for  Appointment. Granddaughter states she will be with her grandmother all day tomorrow. Care advice read to granddaughter.  She verbalized understanding.  Reason for Disposition . [1] MILD difficulty breathing (e.g., minimal/no SOB at rest, SOB with walking, pulse <100) AND [2] NEW-onset or WORSE than normal  Answer Assessment - Initial Assessment Questions 1. RESPIRATORY STATUS: "Describe your breathing?" (e.g., wheezing, shortness of breath, unable to speak, severe coughing)      Sob  98-100 O2 saturation 2. ONSET: "When did this breathing problem begin?"     yesterday 3. PATTERN "Does the difficult breathing come and go, or has it been constant since it started?"      Only when patient moves around 4. SEVERITY: "How bad is your breathing?" (e.g., mild, moderate, severe)    - MILD: No SOB at rest, mild SOB with walking, speaks normally in sentences, can lay down, no retractions, pulse < 100.    - MODERATE: SOB at rest, SOB with minimal exertion and prefers to sit, cannot lie down flat, speaks in phrases, mild retractions, audible wheezing, pulse 100-120.    - SEVERE: Very SOB at rest, speaks in single words, struggling to breathe, sitting hunched forward, retractions, pulse > 120      Mild at rest but when moving she is SOB 5. RECURRENT SYMPTOM: "Have you had difficulty breathing before?" If so, ask: "When was the last time?" and "What  happened that time?"      No 6. CARDIAC HISTORY: "Do you have any history of heart disease?" (e.g., heart attack, angina, bypass surgery, angioplasty)     on 7. LUNG HISTORY: "Do you have any history of lung disease?"  (e.g., pulmonary embolus, asthma, emphysema)     no 8. CAUSE: "What do you think is causing the breathing problem?"      Stroke symptom 9. OTHER SYMPTOMS: "Do you have any other symptoms? (e.g., dizziness, runny nose, cough, chest pain, fever)     Pneumonia in her lung's with the stroke 10. PREGNANCY: "Is there any chance you are pregnant?" "When was your last menstrual period?"      N/A 11. TRAVEL: "Have you traveled out of the country in the last month?" (e.g., travel history, exposures)       No  Protocols used: BREATHING DIFFICULTY-A-AH

## 2019-01-08 NOTE — Telephone Encounter (Signed)
Copied from [place] [id number]. Topic: Quick Communication - Home Health Verbal Orders >> Jan 08, 2019  1:28 PM Erick Blinks wrote: Caller/Agency: Arsenio Katz  Callback Number: [phone] Requesting OT/PT/Skilled Nursing/Social Work/Speech Therapy: BP 160/68 Needs Nursing Frequency:  1 week 1  2 week 4  1 week 1  Also Needs Aid: 2 week 2  Evals for PT, OT, ST, MSW  Needs script for DME:  Transport Wheelchair, Group 2 mattress Height: 65 inches   Weight: 137 lbs Fax: [phone]

## 2019-01-08 NOTE — Telephone Encounter (Signed)
LM to return call for TCM. 

## 2019-01-09 ENCOUNTER — Other Ambulatory Visit: Payer: Self-pay

## 2019-01-09 ENCOUNTER — Telehealth: Payer: Self-pay | Admitting: Family Medicine

## 2019-01-09 ENCOUNTER — Other Ambulatory Visit: Payer: Medicare HMO | Admitting: Licensed Clinical Social Worker

## 2019-01-09 ENCOUNTER — Ambulatory Visit: Payer: Medicare HMO

## 2019-01-09 ENCOUNTER — Other Ambulatory Visit: Payer: Self-pay | Admitting: Family Medicine

## 2019-01-09 DIAGNOSIS — Z515 Encounter for palliative care: Secondary | ICD-10-CM

## 2019-01-09 NOTE — Telephone Encounter (Signed)
Spoke to patient's niece, [name].  Patient's O2 sats are consistently between 98-100% and the only time that they drop slightly is when the family repositions patient and then her breathing is more labored for a few minutes and she is "panting" I advised that this is common due the fact that she has pneumonia and is very deconditioned.  Advised per Dr. Rogers Blocker if O2 sats are consistently dropping below 92%, to please call right away to let us know.  We can address at that time if needed.  Patient's niece verbalizes understanding.

## 2019-01-09 NOTE — Telephone Encounter (Signed)
Per Dr. Rogers Blocker, gave verbal authorization to [facility] from [facility] (PT) for PT 3x/week x 3 wks and then 2x/week/2 wks.

## 2019-01-09 NOTE — Telephone Encounter (Signed)
Copied from [place] [phone] [phone]. Topic: Quick Communication - Home Health Verbal Orders >> Jan 09, 2019  1:15 PM Berneta Levins wrote: Caller/Agency: Tillie Rung from [facility] Number: [phone], OK to leave a message Requesting OT/PT/Skilled Nursing/Social Work/Speech Therapy: PT - left sided weakness, unable to transfer Frequency: 3x a week for 2 weeks, 2x a week for 2 weeks

## 2019-01-09 NOTE — Telephone Encounter (Signed)
Let her know if oxygen is 92-100%, this is considered normal. I can't just order oxygen as it requires testing to make sure patient actually needs it. There is actually harm in using oxygen if you don't need it. If oxygen is going below 92% then we need to get her tested or if she very short of breath with walking/exertion. We would have to do a walking test and monitor oxygen to see if she qualifies. This would also be something palliative  care could do I believe.

## 2019-01-09 NOTE — Telephone Encounter (Signed)
Copied from [place] [id number]. Topic: General - Other >> Jan 09, 2019 10:55 AM Oneta Rack wrote: Caller name: Menda Relation to pt: Speech Therapist [facility]  Call back number: [phone]    Reason for call:  Requesting verbal orders for speech dysphagia,  motor speech, speech dysarthria 2x 4

## 2019-01-09 NOTE — Telephone Encounter (Signed)
Okay to give verbal order for this.  [name], MD [place]

## 2019-01-09 NOTE — Telephone Encounter (Signed)
Please see message and advise 

## 2019-01-09 NOTE — Telephone Encounter (Signed)
See note

## 2019-01-09 NOTE — Progress Notes (Signed)
Error. Accidentally opened up palliative care encounter. No changes and closed note.  DME orders done on written px pad.  [name], MD [place]

## 2019-01-11 ENCOUNTER — Encounter (HOSPITAL_COMMUNITY): Payer: Self-pay

## 2019-01-11 ENCOUNTER — Emergency Department (HOSPITAL_COMMUNITY): Payer: Medicare HMO

## 2019-01-11 ENCOUNTER — Other Ambulatory Visit: Payer: Self-pay

## 2019-01-11 ENCOUNTER — Observation Stay (HOSPITAL_COMMUNITY)
Admission: EM | Admit: 2019-01-11 | Discharge: 2019-01-13 | Disposition: A | Discharge status: Home / Self Care | Payer: Medicare HMO | Attending: Family Medicine | Admitting: Family Medicine

## 2019-01-11 DIAGNOSIS — Z87891 Personal history of nicotine dependence: Secondary | ICD-10-CM | POA: Diagnosis not present

## 2019-01-11 DIAGNOSIS — Z1159 Encounter for screening for other viral diseases: Secondary | ICD-10-CM | POA: Insufficient documentation

## 2019-01-11 DIAGNOSIS — Z7902 Long term (current) use of antithrombotics/antiplatelets: Secondary | ICD-10-CM | POA: Insufficient documentation

## 2019-01-11 DIAGNOSIS — E871 Hypo-osmolality and hyponatremia: Secondary | ICD-10-CM | POA: Diagnosis not present

## 2019-01-11 DIAGNOSIS — C7800 Secondary malignant neoplasm of unspecified lung: Secondary | ICD-10-CM | POA: Diagnosis not present

## 2019-01-11 DIAGNOSIS — C539 Malignant neoplasm of cervix uteri, unspecified: Secondary | ICD-10-CM | POA: Diagnosis present

## 2019-01-11 DIAGNOSIS — D649 Anemia, unspecified: Secondary | ICD-10-CM | POA: Insufficient documentation

## 2019-01-11 DIAGNOSIS — I10 Essential (primary) hypertension: Secondary | ICD-10-CM | POA: Diagnosis present

## 2019-01-11 DIAGNOSIS — K8020 Calculus of gallbladder without cholecystitis without obstruction: Secondary | ICD-10-CM | POA: Diagnosis not present

## 2019-01-11 DIAGNOSIS — C770 Secondary and unspecified malignant neoplasm of lymph nodes of head, face and neck: Secondary | ICD-10-CM

## 2019-01-11 DIAGNOSIS — J90 Pleural effusion, not elsewhere classified: Secondary | ICD-10-CM | POA: Diagnosis present

## 2019-01-11 DIAGNOSIS — Z7984 Long term (current) use of oral hypoglycemic drugs: Secondary | ICD-10-CM | POA: Insufficient documentation

## 2019-01-11 DIAGNOSIS — K5903 Drug induced constipation: Secondary | ICD-10-CM | POA: Insufficient documentation

## 2019-01-11 DIAGNOSIS — Z515 Encounter for palliative care: Secondary | ICD-10-CM

## 2019-01-11 DIAGNOSIS — I700 Atherosclerosis of aorta: Secondary | ICD-10-CM | POA: Diagnosis not present

## 2019-01-11 DIAGNOSIS — R636 Underweight: Secondary | ICD-10-CM | POA: Insufficient documentation

## 2019-01-11 DIAGNOSIS — Z79899 Other long term (current) drug therapy: Secondary | ICD-10-CM | POA: Insufficient documentation

## 2019-01-11 DIAGNOSIS — E1140 Type 2 diabetes mellitus with diabetic neuropathy, unspecified: Secondary | ICD-10-CM | POA: Insufficient documentation

## 2019-01-11 DIAGNOSIS — C779 Secondary and unspecified malignant neoplasm of lymph node, unspecified: Secondary | ICD-10-CM | POA: Insufficient documentation

## 2019-01-11 DIAGNOSIS — K429 Umbilical hernia without obstruction or gangrene: Secondary | ICD-10-CM | POA: Insufficient documentation

## 2019-01-11 DIAGNOSIS — Z803 Family history of malignant neoplasm of breast: Secondary | ICD-10-CM | POA: Insufficient documentation

## 2019-01-11 DIAGNOSIS — Z7189 Other specified counseling: Secondary | ICD-10-CM

## 2019-01-11 DIAGNOSIS — Z7982 Long term (current) use of aspirin: Secondary | ICD-10-CM | POA: Insufficient documentation

## 2019-01-11 DIAGNOSIS — E785 Hyperlipidemia, unspecified: Secondary | ICD-10-CM | POA: Diagnosis not present

## 2019-01-11 DIAGNOSIS — Z8673 Personal history of transient ischemic attack (TIA), and cerebral infarction without residual deficits: Secondary | ICD-10-CM | POA: Diagnosis not present

## 2019-01-11 DIAGNOSIS — G8191 Hemiplegia, unspecified affecting right dominant side: Secondary | ICD-10-CM

## 2019-01-11 DIAGNOSIS — L8990 Pressure ulcer of unspecified site, unspecified stage: Secondary | ICD-10-CM | POA: Diagnosis present

## 2019-01-11 DIAGNOSIS — K5900 Constipation, unspecified: Secondary | ICD-10-CM | POA: Diagnosis present

## 2019-01-11 DIAGNOSIS — D630 Anemia in neoplastic disease: Secondary | ICD-10-CM | POA: Diagnosis present

## 2019-01-11 DIAGNOSIS — E878 Other disorders of electrolyte and fluid balance, not elsewhere classified: Secondary | ICD-10-CM | POA: Diagnosis not present

## 2019-01-11 DIAGNOSIS — R5381 Other malaise: Secondary | ICD-10-CM | POA: Diagnosis present

## 2019-01-11 LAB — CBC WITH DIFFERENTIAL/PLATELET
Abs Immature Granulocytes: 0.06 10*3/uL (ref 0.00–0.07)
Basophils Absolute: 0.1 10*3/uL (ref 0.0–0.1)
Basophils Relative: 0 %
Eosinophils Absolute: 0 10*3/uL (ref 0.0–0.5)
Eosinophils Relative: 0 %
HCT: 23.3 % — ABNORMAL LOW (ref 36.0–46.0)
Hemoglobin: 7.8 g/dL — ABNORMAL LOW (ref 12.0–15.0)
Immature Granulocytes: 0 %
Lymphocytes Relative: 12 %
Lymphs Abs: 1.7 10*3/uL (ref 0.7–4.0)
MCH: 31.3 pg (ref 26.0–34.0)
MCHC: 33.5 g/dL (ref 30.0–36.0)
MCV: 93.6 fL (ref 80.0–100.0)
Monocytes Absolute: 1 10*3/uL (ref 0.1–1.0)
Monocytes Relative: 7 %
Neutro Abs: 10.8 10*3/uL — ABNORMAL HIGH (ref 1.7–7.7)
Neutrophils Relative %: 81 %
Platelets: 349 10*3/uL (ref 150–400)
RBC: 2.49 MIL/uL — ABNORMAL LOW (ref 3.87–5.11)
RDW: 16.9 % — ABNORMAL HIGH (ref 11.5–15.5)
WBC: 13.6 10*3/uL — ABNORMAL HIGH (ref 4.0–10.5)
nRBC: 0 % (ref 0.0–0.2)

## 2019-01-11 LAB — COMPREHENSIVE METABOLIC PANEL
ALT: 13 U/L (ref 0–44)
AST: 23 U/L (ref 15–41)
Albumin: 2.3 g/dL — ABNORMAL LOW (ref 3.5–5.0)
Alkaline Phosphatase: 72 U/L (ref 38–126)
Anion gap: 10 (ref 5–15)
BUN: 10 mg/dL (ref 8–23)
CO2: 24 mmol/L (ref 22–32)
Calcium: 8.4 mg/dL — ABNORMAL LOW (ref 8.9–10.3)
Chloride: 87 mmol/L — ABNORMAL LOW (ref 98–111)
Creatinine, Ser: 0.34 mg/dL — ABNORMAL LOW (ref 0.44–1.00)
GFR calc Af Amer: >60 mL/min (ref >60)
GFR calc non Af Amer: >60 mL/min (ref >60)
Glucose, Bld: 139 mg/dL — ABNORMAL HIGH (ref 70–99)
Potassium: 3.8 mmol/L (ref 3.5–5.1)
Sodium: 121 mmol/L — ABNORMAL LOW (ref 135–145)
Total Bilirubin: 1 mg/dL (ref 0.3–1.2)
Total Protein: 7 g/dL (ref 6.5–8.1)

## 2019-01-11 LAB — LACTIC ACID, PLASMA: Lactic Acid, Venous: 0.8 mmol/L (ref 0.5–1.9)

## 2019-01-11 MED ORDER — SODIUM CHLORIDE (PF) 0.9 % IJ SOLN
INTRAMUSCULAR | Status: AC
  Filled 2019-01-11: qty 50

## 2019-01-11 MED ORDER — SODIUM CHLORIDE 0.9 % IV BOLUS
500.0000 mL | Freq: Once | INTRAVENOUS | Status: AC
  Administered 2019-01-11: 500 mL via INTRAVENOUS

## 2019-01-11 MED ORDER — IOHEXOL 300 MG/ML  SOLN
100.0000 mL | Freq: Once | INTRAMUSCULAR | Status: AC | PRN
  Administered 2019-01-11: 100 mL via INTRAVENOUS

## 2019-01-11 NOTE — ED Provider Notes (Addendum)
[facility] DEPT Provider Note   CSN: [id number] Arrival date & time: 01/11/19  1827    History   Chief Complaint No chief complaint on file.   HPI Katie Myers is a 73 y.o. female.     HPI   Pt is a 73 y/o female with a h/o cervical CA with mets to lungs and lymph node, DM, HLD, HTN, CVA, who presents to the ED today for eval of constipation which has been present for the last 4 days.   History is severely limited and patient is unable to talk following her recent CVA.  She is able to nod and shake her head in order to answer some questions.  Patient's granddaughter, Katie Myers assisted with history.  She is currently caring for the patient.  She states that for the last 4 days patient has not had a bowel movement.  States it seems like patient is straining to have a BM and when she urinates.  She has had no vomiting.  She has had no cough or fevers.  She has had no other concerns.  Patient has been eating and drinking normally at home.  Reviewed records, Pt seen 12/28/18 for drug induced constipation with fecal impaction. Pt seen again and admitted 12/29/18 with neutropenic fever suspected secondary to pneumonia. She also had acute CVA on presentation.   Past Medical History:  Diagnosis Date   Cancer ([place])    cervical cancer   Diabetes mellitus without complication ([place])    Hyperlipidemia    Hypertension    Knee pain, bilateral    Neuropathy    Stroke ([place])    Underweight     Patient Active Problem List   Diagnosis Date Noted   Pressure injury of skin 12/30/2018   Acute ischemic stroke ([place]) 12/30/2018   HCAP (healthcare-associated pneumonia) 12/29/2018   Neutropenic fever ([facility])    Chronic pain of right knee 12/18/2018   Controlled type 2 diabetes mellitus without complication, without long-term current use of insulin ([place]) 12/10/2018   Osteoarthritis of both knees 11/20/2018   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   Other  constipation 11/17/2018   Other proteinuria 11/03/2018   Physical debility 10/27/2018   Goals of care, counseling/discussion 10/27/2018   Essential hypertension 10/27/2018   Malignant cachexia ([place]) 10/27/2018   History of stroke 10/27/2018   Cervical cancer ([place]) 10/23/2018   Metastasis to lung ([place]) 10/23/2018   Metastasis to lymph nodes ([place]) 10/23/2018   Iron deficiency anemia due to chronic blood loss 10/23/2018    Past Surgical History:  Procedure Laterality Date   IR IMAGING GUIDED PORT INSERTION  11/05/2018     OB History   No obstetric history on file.      Home Medications    Prior to Admission medications   Medication Sig Start Date End Date Taking? Authorizing Provider  amLODipine (NORVASC) 10 MG tablet Take 1 tablet (10 mg total) by mouth daily. 12/17/18   [name], MD  aspirin EC 81 MG EC tablet Take 1 tablet (81 mg total) by mouth daily for 17 days. 01/06/19 01/23/19  Cherylann Ratel A, DO  atorvastatin (LIPITOR) 40 MG tablet Take 40 mg by mouth daily.    [provider]  benazepril (LOTENSIN) 40 MG tablet Take 40 mg by mouth daily.    [provider]  carvedilol (COREG) 12.5 MG tablet Take 1 tablet (12.5 mg total) by mouth 2 (two) times daily with a meal. 12/22/18   [name],  MD  clopidogrel (PLAVIX) 75 MG tablet Take 75 mg by mouth daily.  11/30/18   [provider]  hydrALAZINE (APRESOLINE) 25 MG tablet Take 1 tablet (25 mg total) by mouth every 8 (eight) hours for 30 days. 01/05/19 02/04/19  Cherylann Ratel A, DO  lidocaine-prilocaine (EMLA) cream Apply to affected area once Patient taking differently: Apply 1 application topically See admin instructions. Apply to affected area the night prior to chemo treatments 11/03/18   Heath Lark, MD  metFORMIN (GLUCOPHAGE) 500 MG tablet Take 1 tablet (500 mg total) by mouth daily. 01/07/19   [name], MD  Multiple Vitamins-Minerals (MULTIVITAMIN WITH MINERALS) tablet Take 1 tablet  by mouth daily.    [provider]  ondansetron (ZOFRAN) 8 MG tablet Take 1 tablet (8 mg total) by mouth every 8 (eight) hours as needed for refractory nausea / vomiting. Start on day 3 after chemo. 11/03/18   Heath Lark, MD  polyethylene glycol powder (GLYCOLAX/MIRALAX) 17 GM/SCOOP powder Take 17 g by mouth daily as needed for mild constipation or moderate constipation.    [provider]  senna (SENOKOT) 8.6 MG tablet Take 2-3 tablets by mouth daily as needed for constipation.     [provider]  vitamin B-12 (CYANOCOBALAMIN) 1000 MCG tablet Take 1,000 mcg by mouth daily.    [provider]    Family History Family History  Problem Relation Age of Onset   Cancer Mother        unknown cancer   Cancer Maternal Aunt        breast ca   Cancer Maternal Uncle        lung ca   Cancer Maternal Grandfather        unknown cancer    Social History Social History   Tobacco Use   Smoking status: Former Smoker    Packs/day: 0.50    Types: Cigarettes   Smokeless tobacco: Never Used   Tobacco comment: last smoke week 3/10  Substance Use Topics   Alcohol use: Never    Frequency: Never   Drug use: Never     Allergies   Patient has no known allergies.   Review of Systems Review of Systems  Unable to perform ROS: Patient nonverbal  Constitutional: Negative for fever.  Respiratory: Negative for cough.   Gastrointestinal: Positive for constipation.     Physical Exam Updated Vital Signs BP (!) 172/74 (BP Location: Left Arm)    Pulse 89    Temp 99.1 F (37.3 C) (Rectal)    Resp (!) 21    Wt 69.9 kg    LMP  (LMP Unknown)    SpO2 97%    BMI 25.63 kg/m   Physical Exam Vitals signs and nursing note reviewed.  Constitutional:      General: She is not in acute distress.    Appearance: She is well-developed.  HENT:     Head: Normocephalic and atraumatic.  Eyes:     Conjunctiva/sclera: Conjunctivae normal.  Neck:     Musculoskeletal:  Neck supple.  Cardiovascular:     Rate and Rhythm: Normal rate and regular rhythm.     Heart sounds: Murmur present.  Pulmonary:     Effort: Pulmonary effort is normal. No respiratory distress.     Breath sounds: Normal breath sounds.  Abdominal:     General: Bowel sounds are normal. There is no distension.     Palpations: Abdomen is soft.     Tenderness: There is no abdominal tenderness.  There is no guarding or rebound.  Skin:    General: Skin is warm and dry.  Neurological:     Mental Status: She is alert.     Comments: Right facial droop      ED Treatments / Results  Labs (all labs ordered are listed, but only abnormal results are displayed) Labs Reviewed  COMPREHENSIVE METABOLIC PANEL - Abnormal; Notable for the following components:      Result Value   Sodium 121 (*)    Chloride 87 (*)    Glucose, Bld 139 (*)    Creatinine, Ser 0.34 (*)    Calcium 8.4 (*)    Albumin 2.3 (*)    All other components within normal limits  CBC WITH DIFFERENTIAL/PLATELET - Abnormal; Notable for the following components:   WBC 13.6 (*)    RBC 2.49 (*)    Hemoglobin 7.8 (*)    HCT 23.3 (*)    RDW 16.9 (*)    Neutro Abs 10.8 (*)    All other components within normal limits  CULTURE, BLOOD (ROUTINE X 2)  CULTURE, BLOOD (ROUTINE X 2)  SARS CORONAVIRUS 2 (HOSPITAL ORDER, [facility])  LACTIC ACID, PLASMA  URINALYSIS, ROUTINE W REFLEX MICROSCOPIC    EKG None  Radiology Ct Abdomen Pelvis W Contrast  Result Date: 01/11/2019 CLINICAL DATA:  Constipation EXAM: CT ABDOMEN AND PELVIS WITH CONTRAST TECHNIQUE: Multidetector CT imaging of the abdomen and pelvis was performed using the standard protocol following bolus administration of intravenous contrast. CONTRAST:  161mL OMNIPAQUE IOHEXOL 300 MG/ML  SOLN COMPARISON:  CT dated 07/23/2018. FINDINGS: Lower chest: There is an at least moderate sized partially visualized right-sided pleural effusion. There is a trace  left-sided pleural effusion. Mitral valve calcifications are noted. There is interlobular septal thickening bilaterally. There are likely a few pulmonary nodules. Hepatobiliary: There is cholelithiasis without CT evidence of acute cholecystitis. The liver is unremarkable. Pancreas: Unremarkable. No pancreatic ductal dilatation or surrounding inflammatory changes. Spleen: Normal in size without focal abnormality. Adrenals/Urinary Tract: Adrenal glands are unremarkable. Kidneys are normal, without renal calculi, focal lesion, or hydronephrosis. Bladder is unremarkable. Stomach/Bowel: There is a large amount of stool throughout the colon. The appendix is unremarkable. There is no evidence of a small-bowel obstruction. Oral contrast is seen throughout the level of the colon. Vascular/Lymphatic: Diffuse atherosclerotic changes are noted of the abdominal aorta. The left common iliac artery may be occluded proximally. There are enlarged retroperitoneal lymph nodes. There is enlarged partially visualized left axillary lymph node. Reproductive: There is a large complex calcified mass with pockets of gas. This likely represents the patient's known cervical cancer. The mass currently measures approximately 9.1 x 8.8 cm. Pulmonary mass previously measured approximately 11 x 8.6 cm. Other: CT abdomen there is diffuse body wall edema. There is a small bowel and fat containing umbilical hernia without evidence of an obstruction. Diffuse mesenteric edema is noted. Musculoskeletal: No acute or significant osseous findings. IMPRESSION: 1. Large amount of stool throughout the colon. Normal appendix in the right lower quadrant. No pneumatosis or free air. No bowel obstruction. 2. Large complex pelvic mass likely consistent with the patient's known cervical cancer. Pathologically enlarged inguinal and retroperitoneal lymph nodes are again noted. 3. Interval development of a moderate size right-sided pleural effusion. There is  interlobular septal thickening consistent with volume overload. 4. Body wall and mesenteric edema. 5. Cholelithiasis without CT evidence of acute cholecystitis. Aortic Atherosclerosis (ICD10-I70.0). Electronically Signed   By: Constance Holster  M.D.   On: 01/11/2019 22:17    Procedures Procedures (including critical care time)  Medications Ordered in ED Medications  sodium chloride (PF) 0.9 % injection (has no administration in time range)  sodium chloride 0.9 % bolus 500 mL (500 mLs Intravenous New Bag/Given 01/11/19 2231)  iohexol (OMNIPAQUE) 300 MG/ML solution 100 mL (100 mLs Intravenous Contrast Given 01/11/19 2155)     Initial Impression / Assessment and Plan / ED Course  I have reviewed the triage vital signs and the nursing notes.  Pertinent labs & imaging results that were available during my care of the patient were reviewed by me and considered in my medical decision making (see chart for details).     Final Clinical Impressions(s) / ED Diagnoses   Final diagnoses:  Pleural effusion  Hyponatremia  Anemia, unspecified type   Pt is a 73 y/o female with a h/o cervical CA with mets to lungs and lymph node, DM, HLD, HTN, CVA, who presents to the ED today for eval of constipation which has been present for the last 4 days.   History is severely limited and patient is unable to talk following her recent CVA.  Patient's granddaughter, Katie Myers assisted with history.  She is currently caring for the patient.  She states that for the last 4 days patient has not had a bowel movement.  States it seems like patient is straining to have a BM and when she urinates.  She has had no vomiting.  She has had no cough or fevers.  She has had no other concerns.  Patient has been eating and drinking normally at home.  Reviewed records, Pt seen 12/28/18 for drug induced constipation with fecal impaction. Pt seen again and admitted 12/29/18 with neutropenic fever suspected secondary to pneumonia. She also  had acute CVA on presentation.  Since then she has not been able to talk or walk.  Labs with mild leukocytosis at 13.6.  Hemoglobin is low at 7.8, this does appear to be stable for the patient.  Patient not neutropenic. CMP with hyponatremia, and hypochloremia.  Glucose slightly elevated at 139.  Creatinine normal.  BUN normal. Lactic acid negative  Ct abd/pelvis showed a Large amount of stool throughout the colon. Normal appendix in the right lower quadrant. No pneumatosis or free air. No bowel obstruction. Large complex pelvic mass likely consistent with the patient's known cervical cancer. Pathologically enlarged inguinal and retroperitoneal lymph nodes are again noted. Interval development of a moderate size right-sided pleural effusion. There is interlobular septal thickening consistent with volume overload. Body wall and mesenteric edema. Cholelithiasis without CT evidence of acute cholecystitis. Aortic Atherosclerosis.  UA pending  CXR pending  Pt care transitioned to Quincy Carnes, PA-C at shift change pending UA, CXR. Pt will likely need admission for further observation given her new hyponatremia which is significantly worse from prior, new, likely malignant pleural effusion with positional SOB. Pt will also need to be considered for blood transfusion given hgb 7.8 (previous recommended to be >8 by oncology).   ED Discharge Orders    None       Rodney Booze, PA-C 01/11/19 [address] [place] S, PA-C 01/11/19 2302    Duffy Bruce, MD 01/12/19 1040

## 2019-01-11 NOTE — ED Provider Notes (Signed)
Assumed care from [place] at shift change.  See prior notes for full H&P.  Briefly, 73 y.o. F with hx of metastatic cervical cancer with lung involvement, presenting to the ED with constipation.  Apparently she has not had a bowel movement in 4 days but there has been some noted SOB at home, particularly when transferring her from bed to chair or moving her around.  Recent admission for pneumonia but not currently on abx.  Labs with new hyponatremia at 121 and hemoglobin is 7.8.  Notes on chart review from oncology recommend keeping hemoglobin above 8.  CT scan was performed revealing constipation with large complex pelvic mass that is likely contributing to her constipation.  There is also development of moderate right-sided pleural effusion.  Plan: Patient will likely need admission to address her multiple issues.  Awaiting chest x-ray.  Likely will need oncology consultation in the morning to decide if transfusion indicated.  Results for orders placed or performed during the hospital encounter of 01/11/19  Lactic acid, plasma  Result Value Ref Range   Lactic Acid, Venous 0.8 0.5 - 1.9 mmol/L  Comprehensive metabolic panel  Result Value Ref Range   Sodium 121 (L) 135 - 145 mmol/L   Potassium 3.8 3.5 - 5.1 mmol/L   Chloride 87 (L) 98 - 111 mmol/L   CO2 24 22 - 32 mmol/L   Glucose, Bld 139 (H) 70 - 99 mg/dL   BUN 10 8 - 23 mg/dL   Creatinine, Ser 0.34 (L) 0.44 - 1.00 mg/dL   Calcium 8.4 (L) 8.9 - 10.3 mg/dL   Total Protein 7.0 6.5 - 8.1 g/dL   Albumin 2.3 (L) 3.5 - 5.0 g/dL   AST 23 15 - 41 U/L   ALT 13 0 - 44 U/L   Alkaline Phosphatase 72 38 - 126 U/L   Total Bilirubin 1.0 0.3 - 1.2 mg/dL   GFR calc non Af Amer >60 >60 mL/min   GFR calc Af Amer >60 >60 mL/min   Anion gap 10 5 - 15  CBC WITH DIFFERENTIAL  Result Value Ref Range   WBC 13.6 (H) 4.0 - 10.5 K/uL   RBC 2.49 (L) 3.87 - 5.11 MIL/uL   Hemoglobin 7.8 (L) 12.0 - 15.0 g/dL   HCT 23.3 (L) 36.0 - 46.0 %   MCV 93.6 80.0 - 100.0  fL   MCH 31.3 26.0 - 34.0 pg   MCHC 33.5 30.0 - 36.0 g/dL   RDW 16.9 (H) 11.5 - 15.5 %   Platelets 349 150 - 400 K/uL   nRBC 0.0 0.0 - 0.2 %   Neutrophils Relative % 81 %   Neutro Abs 10.8 (H) 1.7 - 7.7 K/uL   Lymphocytes Relative 12 %   Lymphs Abs 1.7 0.7 - 4.0 K/uL   Monocytes Relative 7 %   Monocytes Absolute 1.0 0.1 - 1.0 K/uL   Eosinophils Relative 0 %   Eosinophils Absolute 0.0 0.0 - 0.5 K/uL   Basophils Relative 0 %   Basophils Absolute 0.1 0.0 - 0.1 K/uL   Immature Granulocytes 0 %   Abs Immature Granulocytes 0.06 0.00 - 0.07 K/uL    Dg Chest 2 View  Result Date: 01/11/2019 CLINICAL DATA:  Pleural effusion EXAM: CHEST - 2 VIEW COMPARISON:  01/03/2019 FINDINGS: Cardiac shadow is stable. Aortic calcifications are seen. Right chest wall port is noted in satisfactory position. Lungs are well aerated bilaterally with patchy infiltrates bilaterally relatively stable from the prior exam. No new focal abnormality  is seen. Large right pleural effusion is noted. IMPRESSION: Stable bilateral infiltrates. Large right pleural effusion is noted. Electronically Signed   By: Inez Catalina M.D.   On: 01/11/2019 23:10    Ct Abdomen Pelvis W Contrast  Result Date: 01/11/2019 CLINICAL DATA:  Constipation EXAM: CT ABDOMEN AND PELVIS WITH CONTRAST TECHNIQUE: Multidetector CT imaging of the abdomen and pelvis was performed using the standard protocol following bolus administration of intravenous contrast. CONTRAST:  124mL OMNIPAQUE IOHEXOL 300 MG/ML  SOLN COMPARISON:  CT dated 07/23/2018. FINDINGS: Lower chest: There is an at least moderate sized partially visualized right-sided pleural effusion. There is a trace left-sided pleural effusion. Mitral valve calcifications are noted. There is interlobular septal thickening bilaterally. There are likely a few pulmonary nodules. Hepatobiliary: There is cholelithiasis without CT evidence of acute cholecystitis. The liver is unremarkable. Pancreas: Unremarkable.  No pancreatic ductal dilatation or surrounding inflammatory changes. Spleen: Normal in size without focal abnormality. Adrenals/Urinary Tract: Adrenal glands are unremarkable. Kidneys are normal, without renal calculi, focal lesion, or hydronephrosis. Bladder is unremarkable. Stomach/Bowel: There is a large amount of stool throughout the colon. The appendix is unremarkable. There is no evidence of a small-bowel obstruction. Oral contrast is seen throughout the level of the colon. Vascular/Lymphatic: Diffuse atherosclerotic changes are noted of the abdominal aorta. The left common iliac artery may be occluded proximally. There are enlarged retroperitoneal lymph nodes. There is enlarged partially visualized left axillary lymph node. Reproductive: There is a large complex calcified mass with pockets of gas. This likely represents the patient's known cervical cancer. The mass currently measures approximately 9.1 x 8.8 cm. Pulmonary mass previously measured approximately 11 x 8.6 cm. Other: CT abdomen there is diffuse body wall edema. There is a small bowel and fat containing umbilical hernia without evidence of an obstruction. Diffuse mesenteric edema is noted. Musculoskeletal: No acute or significant osseous findings. IMPRESSION: 1. Large amount of stool throughout the colon. Normal appendix in the right lower quadrant. No pneumatosis or free air. No bowel obstruction. 2. Large complex pelvic mass likely consistent with the patient's known cervical cancer. Pathologically enlarged inguinal and retroperitoneal lymph nodes are again noted. 3. Interval development of a moderate size right-sided pleural effusion. There is interlobular septal thickening consistent with volume overload. 4. Body wall and mesenteric edema. 5. Cholelithiasis without CT evidence of acute cholecystitis. Aortic Atherosclerosis (ICD10-I70.0). Electronically Signed   By: Constance Holster M.D.   On: 01/11/2019 22:17     CXR does reveal large  right sided pleural effusion.  This is likely causing her SOB.  Will consult hospitalist for admission.  COVID screen pending.  She did have a negative COVID test on 12/29/18 prior to last admission.  Discussed with hospitalist, Dr. Shanon Brow-- will admit for ongoing care.  COVID test has come back negative.   Larene Pickett, PA-C 01/12/19 0111    Duffy Bruce, MD 01/12/19 [phone]

## 2019-01-11 NOTE — ED Triage Notes (Signed)
Patient presented to ed with c/o constipation for 4 day. Recent hx of stroke.

## 2019-01-12 ENCOUNTER — Encounter (HOSPITAL_COMMUNITY): Payer: Self-pay | Admitting: *Deleted

## 2019-01-12 DIAGNOSIS — C7800 Secondary malignant neoplasm of unspecified lung: Secondary | ICD-10-CM | POA: Diagnosis not present

## 2019-01-12 DIAGNOSIS — I10 Essential (primary) hypertension: Secondary | ICD-10-CM

## 2019-01-12 DIAGNOSIS — C770 Secondary and unspecified malignant neoplasm of lymph nodes of head, face and neck: Secondary | ICD-10-CM

## 2019-01-12 DIAGNOSIS — K5900 Constipation, unspecified: Secondary | ICD-10-CM

## 2019-01-12 DIAGNOSIS — G8191 Hemiplegia, unspecified affecting right dominant side: Secondary | ICD-10-CM | POA: Diagnosis not present

## 2019-01-12 DIAGNOSIS — C539 Malignant neoplasm of cervix uteri, unspecified: Secondary | ICD-10-CM | POA: Diagnosis not present

## 2019-01-12 DIAGNOSIS — Z7189 Other specified counseling: Secondary | ICD-10-CM

## 2019-01-12 DIAGNOSIS — K5909 Other constipation: Secondary | ICD-10-CM | POA: Diagnosis not present

## 2019-01-12 DIAGNOSIS — Z515 Encounter for palliative care: Secondary | ICD-10-CM

## 2019-01-12 LAB — URINALYSIS, ROUTINE W REFLEX MICROSCOPIC
Bilirubin Urine: NEGATIVE
Glucose, UA: NEGATIVE mg/dL
Hgb urine dipstick: NEGATIVE
Ketones, ur: 5 mg/dL — AB
Nitrite: NEGATIVE
Protein, ur: NEGATIVE mg/dL
Specific Gravity, Urine: 1.031 — ABNORMAL HIGH (ref 1.005–1.030)
WBC, UA: >50 WBC/hpf — ABNORMAL HIGH (ref 0–5)
pH: 7 (ref 5.0–8.0)

## 2019-01-12 LAB — FIBRINOGEN: Fibrinogen: 611 mg/dL — ABNORMAL HIGH (ref 210–475)

## 2019-01-12 LAB — BASIC METABOLIC PANEL
Anion gap: 13 (ref 5–15)
BUN: 8 mg/dL (ref 8–23)
CO2: 20 mmol/L — ABNORMAL LOW (ref 22–32)
Calcium: 8.2 mg/dL — ABNORMAL LOW (ref 8.9–10.3)
Chloride: 89 mmol/L — ABNORMAL LOW (ref 98–111)
Creatinine, Ser: <0.3 mg/dL — ABNORMAL LOW (ref 0.44–1.00)
Glucose, Bld: 122 mg/dL — ABNORMAL HIGH (ref 70–99)
Potassium: 4.4 mmol/L (ref 3.5–5.1)
Sodium: 122 mmol/L — ABNORMAL LOW (ref 135–145)

## 2019-01-12 LAB — CBC
HCT: 26.6 % — ABNORMAL LOW (ref 36.0–46.0)
Hemoglobin: 8.9 g/dL — ABNORMAL LOW (ref 12.0–15.0)
MCH: 30.7 pg (ref 26.0–34.0)
MCHC: 33.5 g/dL (ref 30.0–36.0)
MCV: 91.7 fL (ref 80.0–100.0)
Platelets: 185 10*3/uL (ref 150–400)
RBC: 2.9 MIL/uL — ABNORMAL LOW (ref 3.87–5.11)
RDW: 17 % — ABNORMAL HIGH (ref 11.5–15.5)
WBC: 13 10*3/uL — ABNORMAL HIGH (ref 4.0–10.5)
nRBC: 0 % (ref 0.0–0.2)

## 2019-01-12 LAB — D-DIMER, QUANTITATIVE: D-Dimer, Quant: 2.92 ug/mL-FEU — ABNORMAL HIGH (ref 0.00–0.50)

## 2019-01-12 LAB — FERRITIN: Ferritin: 366 ng/mL — ABNORMAL HIGH (ref 11–307)

## 2019-01-12 LAB — LACTATE DEHYDROGENASE: LDH: 247 U/L — ABNORMAL HIGH (ref 98–192)

## 2019-01-12 LAB — C-REACTIVE PROTEIN: CRP: 3.6 mg/dL — ABNORMAL HIGH (ref <1.0)

## 2019-01-12 LAB — SARS CORONAVIRUS 2 BY RT PCR (HOSPITAL ORDER, PERFORMED IN ~~LOC~~ HOSPITAL LAB): SARS Coronavirus 2: NEGATIVE

## 2019-01-12 LAB — PROCALCITONIN: Procalcitonin: <0.1 ng/mL

## 2019-01-12 LAB — TRIGLYCERIDES: Triglycerides: 53 mg/dL (ref <150)

## 2019-01-12 MED ORDER — ATORVASTATIN CALCIUM 40 MG PO TABS
40.0000 mg | ORAL_TABLET | Freq: Every day | ORAL | Status: DC
  Administered 2019-01-12 – 2019-01-13 (×2): 40 mg via ORAL
  Filled 2019-01-12 (×2): qty 1

## 2019-01-12 MED ORDER — FLEET ENEMA 7-19 GM/118ML RE ENEM
1.0000 | ENEMA | Freq: Every day | RECTAL | Status: DC | PRN
  Administered 2019-01-12: 1 via RECTAL
  Filled 2019-01-12: qty 1

## 2019-01-12 MED ORDER — ORAL CARE MOUTH RINSE
15.0000 mL | Freq: Two times a day (BID) | OROMUCOSAL | Status: DC
  Administered 2019-01-12 – 2019-01-13 (×2): 15 mL via OROMUCOSAL

## 2019-01-12 MED ORDER — SODIUM CHLORIDE 0.9 % IV SOLN: 250.0000 mL | INTRAVENOUS | Status: DC | PRN

## 2019-01-12 MED ORDER — CHLORHEXIDINE GLUCONATE 0.12 % MT SOLN
15.0000 mL | Freq: Two times a day (BID) | OROMUCOSAL | Status: DC
  Administered 2019-01-12 – 2019-01-13 (×3): 15 mL via OROMUCOSAL
  Filled 2019-01-12 (×3): qty 15

## 2019-01-12 MED ORDER — CLOPIDOGREL BISULFATE 75 MG PO TABS
75.0000 mg | ORAL_TABLET | Freq: Every day | ORAL | Status: DC
  Administered 2019-01-12 – 2019-01-13 (×2): 75 mg via ORAL
  Filled 2019-01-12 (×2): qty 1

## 2019-01-12 MED ORDER — TRAMADOL HCL 50 MG PO TABS: 25.0000 mg | ORAL_TABLET | Freq: Four times a day (QID) | ORAL | Status: DC | PRN

## 2019-01-12 MED ORDER — CARVEDILOL 12.5 MG PO TABS
12.5000 mg | ORAL_TABLET | Freq: Two times a day (BID) | ORAL | Status: DC
  Administered 2019-01-12 – 2019-01-13 (×4): 12.5 mg via ORAL
  Filled 2019-01-12 (×4): qty 1

## 2019-01-12 MED ORDER — BENAZEPRIL HCL 20 MG PO TABS
40.0000 mg | ORAL_TABLET | Freq: Every day | ORAL | Status: DC
  Administered 2019-01-12 – 2019-01-13 (×2): 40 mg via ORAL
  Filled 2019-01-12 (×2): qty 2

## 2019-01-12 MED ORDER — JUVEN PO PACK
1.0000 | PACK | Freq: Two times a day (BID) | ORAL | Status: DC
  Administered 2019-01-12 – 2019-01-13 (×3): 1 via ORAL
  Filled 2019-01-12 (×3): qty 1

## 2019-01-12 MED ORDER — POLYETHYLENE GLYCOL 3350 17 G PO PACK
17.0000 g | PACK | Freq: Every morning | ORAL | Status: DC
  Administered 2019-01-12: 17 g via ORAL
  Filled 2019-01-12: qty 1

## 2019-01-12 MED ORDER — HYDRALAZINE HCL 25 MG PO TABS
25.0000 mg | ORAL_TABLET | Freq: Three times a day (TID) | ORAL | Status: DC
  Administered 2019-01-12 – 2019-01-13 (×4): 25 mg via ORAL
  Filled 2019-01-12 (×7): qty 1

## 2019-01-12 MED ORDER — SENNA 8.6 MG PO TABS
1.0000 | ORAL_TABLET | Freq: Two times a day (BID) | ORAL | Status: DC
  Administered 2019-01-12 – 2019-01-13 (×2): 8.6 mg via ORAL
  Filled 2019-01-12 (×2): qty 1

## 2019-01-12 MED ORDER — ONDANSETRON HCL 4 MG PO TABS: 8.0000 mg | ORAL_TABLET | Freq: Three times a day (TID) | ORAL | Status: DC | PRN

## 2019-01-12 MED ORDER — ENSURE ENLIVE PO LIQD
237.0000 mL | Freq: Two times a day (BID) | ORAL | Status: DC
  Administered 2019-01-12 – 2019-01-13 (×3): 237 mL via ORAL

## 2019-01-12 MED ORDER — SODIUM CHLORIDE 0.9% FLUSH: 3.0000 mL | INTRAVENOUS | Status: DC | PRN

## 2019-01-12 MED ORDER — LIDOCAINE-PRILOCAINE 2.5-2.5 % EX CREA: 1.0000 "application " | TOPICAL_CREAM | CUTANEOUS | Status: DC

## 2019-01-12 MED ORDER — VITAMIN B-12 1000 MCG PO TABS
1000.0000 ug | ORAL_TABLET | Freq: Every day | ORAL | Status: DC
  Administered 2019-01-12 – 2019-01-13 (×2): 1000 ug via ORAL
  Filled 2019-01-12 (×2): qty 1

## 2019-01-12 MED ORDER — HYDRALAZINE HCL 20 MG/ML IJ SOLN
10.0000 mg | Freq: Four times a day (QID) | INTRAMUSCULAR | Status: DC | PRN
  Administered 2019-01-12 (×2): 10 mg via INTRAVENOUS
  Filled 2019-01-12 (×2): qty 1

## 2019-01-12 MED ORDER — AMLODIPINE BESYLATE 5 MG PO TABS
10.0000 mg | ORAL_TABLET | Freq: Every day | ORAL | Status: DC
  Administered 2019-01-12 – 2019-01-13 (×2): 10 mg via ORAL
  Filled 2019-01-12 (×2): qty 2

## 2019-01-12 MED ORDER — ASPIRIN EC 81 MG PO TBEC
81.0000 mg | DELAYED_RELEASE_TABLET | Freq: Every day | ORAL | Status: DC
  Administered 2019-01-12 – 2019-01-13 (×2): 81 mg via ORAL
  Filled 2019-01-12 (×2): qty 1

## 2019-01-12 MED ORDER — SODIUM CHLORIDE 0.9% FLUSH
3.0000 mL | Freq: Two times a day (BID) | INTRAVENOUS | Status: DC
  Administered 2019-01-12 – 2019-01-13 (×4): 3 mL via INTRAVENOUS

## 2019-01-12 MED ORDER — ADULT MULTIVITAMIN W/MINERALS CH
1.0000 | ORAL_TABLET | Freq: Every day | ORAL | Status: DC
  Administered 2019-01-12 – 2019-01-13 (×2): 1 via ORAL
  Filled 2019-01-12 (×2): qty 1

## 2019-01-12 MED ORDER — SENNA 8.6 MG PO TABS: 2.0000 | ORAL_TABLET | Freq: Every day | ORAL | Status: DC | PRN

## 2019-01-12 MED ORDER — POLYETHYLENE GLYCOL 3350 17 G PO PACK
17.0000 g | PACK | Freq: Two times a day (BID) | ORAL | Status: DC
  Administered 2019-01-12 – 2019-01-13 (×2): 17 g via ORAL
  Filled 2019-01-12 (×2): qty 1

## 2019-01-12 NOTE — Progress Notes (Signed)
Code status changed to DNR. Purple bracelet placed on left wrist. Family aware.

## 2019-01-12 NOTE — Plan of Care (Signed)
Spoke with granddaughter Kenney Houseman regarding patient's care

## 2019-01-12 NOTE — H&P (Signed)
History and Physical    CANNON QUINTON UXN:[id number] DOB: 1946-04-27 DOA: 01/11/2019  PCP: [name], MD  Patient coming from: Home  Chief Complaint: Constipation  HPI: Katie Myers is a 73 y.o. female with medical history significant of recent stroke last week, dysphasia, hypertension, diabetes, advanced cervical cancer neurology recommended hospice comfort care measures during her hospitalization.  Palliative care has seen the patient at home she is still currently a full code.  There is not been any bleeding issue.  Patient has expressive aphasia and all history is obtained from records and emergency room staff.  She was sent in because of 4 days of constipation.  No nausea vomiting or diarrhea.  No fevers at home.  During her last hospitalization she was treated for pneumonia with cefepime she has chronic pleural effusions.  No lower extremity edema or swelling.  Patient being referred for admission for constipation and for anemia which is chronic and hyponatremia.  Review of Systems: Unobtainable secondary to aphasia  Past Medical History:  Diagnosis Date  . Cancer (HCC)    cervical cancer  . Diabetes mellitus without complication ([place])   . Hyperlipidemia   . Hypertension   . Knee pain, bilateral   . Neuropathy   . Stroke ([place])   . Underweight     Past Surgical History:  Procedure Laterality Date  . IR IMAGING GUIDED PORT INSERTION  11/05/2018     reports that she has quit smoking. Her smoking use included cigarettes. She smoked 0.50 packs per day. She has never used smokeless tobacco. She reports that she does not drink alcohol or use drugs.  No Known Allergies  Family History  Problem Relation Age of Onset  . Cancer Mother        unknown cancer  . Cancer Maternal Aunt        breast ca  . Cancer Maternal Uncle        lung ca  . Cancer Maternal Grandfather        unknown cancer    Prior to Admission medications   Medication Sig Start Date End Date Taking?  Authorizing Provider  amLODipine (NORVASC) 10 MG tablet Take 1 tablet (10 mg total) by mouth daily. 12/17/18   [name], MD  aspirin EC 81 MG EC tablet Take 1 tablet (81 mg total) by mouth daily for 17 days. 01/06/19 01/23/19  Cherylann Ratel A, DO  atorvastatin (LIPITOR) 40 MG tablet Take 40 mg by mouth daily.    [provider]  benazepril (LOTENSIN) 40 MG tablet Take 40 mg by mouth daily.    [provider]  carvedilol (COREG) 12.5 MG tablet Take 1 tablet (12.5 mg total) by mouth 2 (two) times daily with a meal. 12/22/18   [name], MD  clopidogrel (PLAVIX) 75 MG tablet Take 75 mg by mouth daily.  11/30/18   [provider]  hydrALAZINE (APRESOLINE) 25 MG tablet Take 1 tablet (25 mg total) by mouth every 8 (eight) hours for 30 days. 01/05/19 02/04/19  Cherylann Ratel A, DO  lidocaine-prilocaine (EMLA) cream Apply to affected area once Patient taking differently: Apply 1 application topically See admin instructions. Apply to affected area the night prior to chemo treatments 11/03/18   Heath Lark, MD  metFORMIN (GLUCOPHAGE) 500 MG tablet Take 1 tablet (500 mg total) by mouth daily. 01/07/19   [name], MD  Multiple Vitamins-Minerals (MULTIVITAMIN WITH MINERALS) tablet Take 1 tablet by mouth daily.    [provider]  ondansetron (ZOFRAN) 8 MG tablet Take 1 tablet (8 mg total) by mouth every 8 (eight) hours as needed for refractory nausea / vomiting. Start on day 3 after chemo. 11/03/18   Heath Lark, MD  polyethylene glycol powder (GLYCOLAX/MIRALAX) 17 GM/SCOOP powder Take 17 g by mouth daily as needed for mild constipation or moderate constipation.    [provider]  senna (SENOKOT) 8.6 MG tablet Take 2-3 tablets by mouth daily as needed for constipation.     [provider]  vitamin B-12 (CYANOCOBALAMIN) 1000 MCG tablet Take 1,000 mcg by mouth daily.    [provider]    Physical Exam: Vitals:   01/11/19 2111 01/11/19 2330  01/12/19 0000 01/12/19 0030  BP: (!) 172/74 (!) 144/64 (!) 146/69 (!) 146/58  Pulse: 89 86 83 80  Resp: (!) 21 16 16 16   Temp: 99.1 F (37.3 C)     TempSrc: Rectal     SpO2: 97% 95% 96% 95%  Weight:          Constitutional: NAD, calm, comfortable nonverbal Vitals:   01/11/19 2111 01/11/19 2330 01/12/19 0000 01/12/19 0030  BP: (!) 172/74 (!) 144/64 (!) 146/69 (!) 146/58  Pulse: 89 86 83 80  Resp: (!) 21 16 16 16   Temp: 99.1 F (37.3 C)     TempSrc: Rectal     SpO2: 97% 95% 96% 95%  Weight:       Eyes: PERRL, lids and conjunctivae normal ENMT: Mucous membranes are moist. Posterior pharynx clear of any exudate or lesions.Normal dentition.  Neck: normal, supple, no masses, no thyromegaly Respiratory: clear to auscultation bilaterally, no wheezing, no crackles. Normal respiratory effort. No accessory muscle use.  Cardiovascular: Regular rate and rhythm, no murmurs / rubs / gallops. No extremity edema. 2+ pedal pulses. No carotid bruits.  Abdomen: no tenderness, no masses palpated. No hepatosplenomegaly. Bowel sounds positive.  Musculoskeletal: no clubbing / cyanosis. No joint deformity upper and lower extremities. Good ROM, no contractures. Normal muscle tone.  Skin: Pressure ulcer noted please see RN notes Neurologic: CN 2-12 grossly intact.  Psychiatric: Not agitated  Labs on Admission: I have personally reviewed following labs and imaging studies  CBC: Recent Labs  Lab 01/05/19 0551 01/11/19 2029  WBC 13.5* 13.6*  NEUTROABS 9.5* 10.8*  HGB 8.0* 7.8*  HCT 24.4* 23.3*  MCV 93.1 93.6  PLT 357 161   Basic Metabolic Panel: Recent Labs  Lab 01/05/19 0551 01/05/19 1700 01/11/19 2029  NA 131*  --  121*  K 3.6  --  3.8  CL 99  --  87*  CO2 26  --  24  GLUCOSE 131*  --  139*  BUN 11  --  10  CREATININE 0.36*  --  0.34*  CALCIUM 8.4*  --  8.4*  MG 1.2* 3.2*  --   PHOS 3.3  --   --    GFR: Estimated Creatinine Clearance: 61.5 mL/min (A) (by C-G formula based on  SCr of 0.34 mg/dL (L)). Liver Function Tests: Recent Labs  Lab 01/05/19 0551 01/11/19 2029  AST  --  23  ALT  --  13  ALKPHOS  --  72  BILITOT  --  1.0  PROT  --  7.0  ALBUMIN 1.8* 2.3*   No results for input(s): LIPASE, AMYLASE in the last 168 hours. No results for input(s): AMMONIA in the last 168 hours. Coagulation Profile: No results for input(s): INR, PROTIME in the last 168 hours. Cardiac Enzymes: No results  for input(s): CKTOTAL, CKMB, CKMBINDEX, TROPONINI in the last 168 hours. BNP (last 3 results) No results for input(s): PROBNP in the last 8760 hours. HbA1C: No results for input(s): HGBA1C in the last 72 hours. CBG: Recent Labs  Lab 01/05/19 1815 01/05/19 2139 01/06/19 0600 01/06/19 1118 01/06/19 1701  GLUCAP 143* 143* 131* 203* 198*   Lipid Profile: Recent Labs    01/11/19 2359  TRIG 53   Thyroid Function Tests: No results for input(s): TSH, T4TOTAL, FREET4, T3FREE, THYROIDAB in the last 72 hours. Anemia Panel: No results for input(s): VITAMINB12, FOLATE, FERRITIN, TIBC, IRON, RETICCTPCT in the last 72 hours. Urine analysis:    Component Value Date/Time   COLORURINE YELLOW 01/01/2019 0909   APPEARANCEUR HAZY (A) 01/01/2019 0909   LABSPEC 1.019 01/01/2019 0909   PHURINE 5.0 01/01/2019 0909   GLUCOSEU NEGATIVE 01/01/2019 0909   HGBUR MODERATE (A) 01/01/2019 0909   BILIRUBINUR NEGATIVE 01/01/2019 0909   KETONESUR 5 (A) 01/01/2019 0909   PROTEINUR 30 (A) 01/01/2019 0909   NITRITE NEGATIVE 01/01/2019 0909   LEUKOCYTESUR LARGE (A) 01/01/2019 0909   Sepsis Labs: !!!!!!!!!!!!!!!!!!!!!!!!!!!!!!!!!!!!!!!!!!!! @LABRCNTIP (procalcitonin:4,lacticidven:4) ) Recent Results (from the past 240 hour(s))  SARS Coronavirus 2 (CEPHEID - Performed in [place] hospital lab), Hosp Order     Status: None   Collection Time: 01/11/19 11:03 PM  Result Value Ref Range Status   SARS Coronavirus 2 NEGATIVE NEGATIVE Final    Comment: (NOTE) If result is NEGATIVE  SARS-CoV-2 target nucleic acids are NOT DETECTED. The SARS-CoV-2 RNA is generally detectable in upper and lower  respiratory specimens during the acute phase of infection. The lowest  concentration of SARS-CoV-2 viral copies this assay can detect is 250  copies / mL. A negative result does not preclude SARS-CoV-2 infection  and should not be used as the sole basis for treatment or other  patient management decisions.  A negative result may occur with  improper specimen collection / handling, submission of specimen other  than nasopharyngeal swab, presence of viral mutation(s) within the  areas targeted by this assay, and inadequate number of viral copies  (<250 copies / mL). A negative result must be combined with clinical  observations, patient history, and epidemiological information. If result is POSITIVE SARS-CoV-2 target nucleic acids are DETECTED. The SARS-CoV-2 RNA is generally detectable in upper and lower  respiratory specimens dur ing the acute phase of infection.  Positive  results are indicative of active infection with SARS-CoV-2.  Clinical  correlation with patient history and other diagnostic information is  necessary to determine patient infection status.  Positive results do  not rule out bacterial infection or co-infection with other viruses. If result is PRESUMPTIVE POSTIVE SARS-CoV-2 nucleic acids MAY BE PRESENT.   A presumptive positive result was obtained on the submitted specimen  and confirmed on repeat testing.  While 2019 novel coronavirus  (SARS-CoV-2) nucleic acids may be present in the submitted sample  additional confirmatory testing may be necessary for epidemiological  and / or clinical management purposes  to differentiate between  SARS-CoV-2 and other Sarbecovirus currently known to infect humans.  If clinically indicated additional testing with an alternate test  methodology [phone]) is advised. The SARS-CoV-2 RNA is generally  detectable in upper  and lower respiratory sp ecimens during the acute  phase of infection. The expected result is Negative. Fact Sheet for Patients:  StrictlyIdeas.no Fact Sheet for Healthcare Providers: BankingDealers.co.za This test is not yet approved or cleared by the [place] FDA and has  been authorized for detection and/or diagnosis of SARS-CoV-2 by FDA under an Emergency Use Authorization (EUA).  This EUA will remain in effect (meaning this test can be used) for the duration of the COVID-19 declaration under Section 564(b)(1) of the Act, 21 U.S.C. section 360bbb-3(b)(1), unless the authorization is terminated or revoked sooner. Performed at [facility], [place] [address] [place], [place] [address]      Radiological Exams on Admission: Dg Chest 2 View  Result Date: 01/11/2019 CLINICAL DATA:  Pleural effusion EXAM: CHEST - 2 VIEW COMPARISON:  01/03/2019 FINDINGS: Cardiac shadow is stable. Aortic calcifications are seen. Right chest wall port is noted in satisfactory position. Lungs are well aerated bilaterally with patchy infiltrates bilaterally relatively stable from the prior exam. No new focal abnormality is seen. Large right pleural effusion is noted. IMPRESSION: Stable bilateral infiltrates. Large right pleural effusion is noted. Electronically Signed   By: Inez Catalina M.D.   On: 01/11/2019 23:10   Ct Abdomen Pelvis W Contrast  Result Date: 01/11/2019 CLINICAL DATA:  Constipation EXAM: CT ABDOMEN AND PELVIS WITH CONTRAST TECHNIQUE: Multidetector CT imaging of the abdomen and pelvis was performed using the standard protocol following bolus administration of intravenous contrast. CONTRAST:  143mL OMNIPAQUE IOHEXOL 300 MG/ML  SOLN COMPARISON:  CT dated 07/23/2018. FINDINGS: Lower chest: There is an at least moderate sized partially visualized right-sided pleural effusion. There is a trace left-sided pleural effusion. Mitral valve  calcifications are noted. There is interlobular septal thickening bilaterally. There are likely a few pulmonary nodules. Hepatobiliary: There is cholelithiasis without CT evidence of acute cholecystitis. The liver is unremarkable. Pancreas: Unremarkable. No pancreatic ductal dilatation or surrounding inflammatory changes. Spleen: Normal in size without focal abnormality. Adrenals/Urinary Tract: Adrenal glands are unremarkable. Kidneys are normal, without renal calculi, focal lesion, or hydronephrosis. Bladder is unremarkable. Stomach/Bowel: There is a large amount of stool throughout the colon. The appendix is unremarkable. There is no evidence of a small-bowel obstruction. Oral contrast is seen throughout the level of the colon. Vascular/Lymphatic: Diffuse atherosclerotic changes are noted of the abdominal aorta. The left common iliac artery may be occluded proximally. There are enlarged retroperitoneal lymph nodes. There is enlarged partially visualized left axillary lymph node. Reproductive: There is a large complex calcified mass with pockets of gas. This likely represents the patient's known cervical cancer. The mass currently measures approximately 9.1 x 8.8 cm. Pulmonary mass previously measured approximately 11 x 8.6 cm. Other: CT abdomen there is diffuse body wall edema. There is a small bowel and fat containing umbilical hernia without evidence of an obstruction. Diffuse mesenteric edema is noted. Musculoskeletal: No acute or significant osseous findings. IMPRESSION: 1. Large amount of stool throughout the colon. Normal appendix in the right lower quadrant. No pneumatosis or free air. No bowel obstruction. 2. Large complex pelvic mass likely consistent with the patient's known cervical cancer. Pathologically enlarged inguinal and retroperitoneal lymph nodes are again noted. 3. Interval development of a moderate size right-sided pleural effusion. There is interlobular septal thickening consistent with  volume overload. 4. Body wall and mesenteric edema. 5. Cholelithiasis without CT evidence of acute cholecystitis. Aortic Atherosclerosis (ICD10-I70.0). Electronically Signed   By: Constance Holster M.D.   On: 01/11/2019 22:17   Old chart reviewed Case discussed with EDP  Assessment/Plan 73 year old female with recent stroke advanced cervical cancer sent in by family for constipation at baseline bedbound and aphasic Principal Problem:   Constipation-MiraLAX daily.  Fleet Enema.  No vomiting.  Abdominal exam soft and  benign.  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  Monitor.  Vital signs stable.    Pressure injury of skin-present on arrival    Obtain palliative care consultation again while she is here.  COVID negative.   DVT prophylaxis: SCDs Code Status: Full Family Communication: None Disposition Plan: Tomorrow after has bowel movement Consults called: Palliative care Admission status: Observation   Kalecia Hartney A MD [facility]  If 7PM-7AM, please contact night-coverage [email or web address] Password [facility]  01/12/2019, 12:54 AM

## 2019-01-12 NOTE — Progress Notes (Signed)
Initial Nutrition Assessment  RD working remotely.   DOCUMENTATION CODES:   Not applicable  INTERVENTION:  - will order Ensure Enlive BID, each supplement provides 350 kcal and 20 grams of protein. - will order Juven BID, each packet provides 90 calories, 2.5 grams of protein, 8 grams of carbohydrate, and 14 grams of amino acids; supplement contains CaHMB, glutamine, and arginine, to promote wound healing - continue to encourage PO intakes.    NUTRITION DIAGNOSIS:   Increased nutrient needs related to wound healing as evidenced by estimated needs.  GOAL:   Patient will meet greater than or equal to 90% of their needs  MONITOR:   PO intake, Supplement acceptance, Labs, Weight trends, Skin  REASON FOR ASSESSMENT:   Malnutrition Screening Tool  ASSESSMENT:   73 y.o. female past medical history of recent stroke (acute cortical infarct to the L anterior and middle cerebral territory) with associated dysphagia, HTN, Type 2 DM, and advanced cervical cancer. Neurology has recommended comfort care following stroke. Palliative Care was following at home, but patient remains full code at this time. Patient presented to the ED d/t constipation.  Per RN flow sheet, patient is disoriented x4. Per flow sheet, she consumed 100% of breakfast and 50% of lunch today. Patient seen remotely by [name] on 5/12 at which time patient had been unable to communicate or respond to orientation questions and unable to talk on the phone.   Per chart review, current weight is 148 lb with weight on 5/4 being 145 lb and weight on 5/18 being 154 lb. Suspect weight increase was due to fluids during previous hospitalization.   Per notes: constipation, metastatic cervical cancer with bone and lung mets, physical debility 2/2 previous massive stroke, anemia of metastatic disease. Per MD note the hope is for d/c home tomorrow (5/26) after she has a BM.      Medications reviewed; 1 packet miralax BID, 1 tablet  senokot BID, 1000 mcg oral cyanocobalamin/day.  Labs reviewed; Na: 122 mmol/l, Cl: 89 mmol/l, creatinine: <0.3 mg/dl, Ca: 8.2 mg/dl.     NUTRITION - FOCUSED PHYSICAL EXAM:  unable to perform at this time.   Diet Order:   Diet Order            DIET DYS 3 Room service appropriate? Yes; Fluid consistency: Thin  Diet effective now              EDUCATION NEEDS:   Not appropriate for education at this time  Skin:  Skin Assessment: Skin Integrity Issues: Skin Integrity Issues:: Stage II, Stage III Stage II: L hip Stage III: sacrum  Last BM:  5/25  Height:   Ht Readings from Last 1 Encounters:  01/12/19 5\' 5"  (1.651 m)    Weight:   Wt Readings from Last 1 Encounters:  01/12/19 67.2 kg    Ideal Body Weight:  56.8 kg  BMI:  Body mass index is 24.65 kg/m.  Estimated Nutritional Needs:   Kcal:  2015-2220 kcal  Protein:  100-115 grams  Fluid:  >/= 2 L/day     Katie Matin, MS, RD, LDN, [facility] Inpatient Clinical Dietitian Pager # [phone] After hours/weekend pager # [phone]

## 2019-01-12 NOTE — Consult Note (Signed)
Consultation Note Date: 01/12/2019   Patient Name: Katie Myers  DOB: February 04, 1946  MRN: [id number]  Age / Sex: 73 y.o., female  PCP: [name], MD Referring Physician: Aileen Fass, Tammi Klippel, MD  Reason for Consultation: Establishing goals of care  HPI/Patient Profile: 73 y.o. 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 She has been seen by [facility] at home for Palliative consult. Inpatient Palliative medicine consulted again for [place].    Clinical Assessment and Goals of Care: Evaluated patient at bedside. She is awake and alert. She is able to nod yes and no to questions. Denies pain. She points to tv, requesting to watch tv. Harding conversation not able to be had due to patient aphasic.   Called patient's granddaughter- [name]- she and patient's niece- [name] are patient's caregivers and decision makers. [name] has been caring for patient in the home. Patient requires total assistance. She has been eating well- [name] notes increase in appetite since stopping chemotherapy. Main problem has been constipation. She has not been taking as much pain medication since her stroke- pain was mainly related to chronic knee pain- pain has been less due to her debility. She is requiring only half of a tramadol prn for pain at home.  Constipation is likely mechanical in nature due to pelvic masses and retroperitoneal lymph nodes.  Patient with increased sleeping during the day. She sleeps well at night, and takes naps during the day. She is chair/bedridden. Reviewed illness trajectory with  [name]. Discussed that patient with Stage IV cancer who is unable to receive chemotherapy s/p stroke with significant deficits is likely to progress to terminal state within months. Discussed EOL GOC. [name] states main GOC would be to maintain patient in the home with her family.  Discussed option of continued aggressive therapy, return hospitalizations, vs transition to comfort measures, home with Hospice.  Per discussion with [name] and [name]- decision made to transition patient home with Hospice and provide comfort measures only for her cancer as it does not appear she will be able to receive further aggressive therapies. We discussed Code Status- discussed that CPR and ventilation would not provide comfort to patient and would prolong her dying process. Faythe Dingwall agreed to DNR status.  Primary Decision Maker NEXT OF KIN- [name] and [name]- patient's daughter is in contact with Granddaughter- [name], however, is not involved in decision making due to mental health/grief issues    SUMMARY OF RECOMMENDATIONS -DNR -Recommend addition of Senna 2 po BID to bowel regimen and continue at discharge along with miralax 17gm daily -Tramadol 25mg  PO prn pain -Care manager referral to arrange Hospice services at home prior to discharge- patient is currently followed by [facility] as a  Palliative patient- they can transition her to a Hospice patient     Code Status/Advance Care Planning:  DNR  Prognosis:    < 6 months due to Stage IV cervical cancer 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 Anemia in neoplastic disease . Cervical cancer ([place]) . Essential hypertension . Metastasis to lung ([place]) . Physical debility . Pressure injury  of skin . Constipation   I have reviewed the medical record, interviewed the patient and family, and examined the patient. The following aspects are pertinent.  Past Medical History:  Diagnosis Date  . Cancer (HCC)    cervical cancer  . Diabetes mellitus without complication ([place])   . Hyperlipidemia   . Hypertension   . Knee pain, bilateral   . Neuropathy   . Stroke ([place])   . Underweight    Social History   Socioeconomic History  . Marital status: Widowed    Spouse name: Not on file  . Number of children: 1  . Years of education: Not on file  . Highest education level: Not on file  Occupational History  . Occupation: retired  Scientific laboratory technician  . Financial resource strain: Not on file  . Food insecurity:    Worry: Not on file    Inability: Not on file  . Transportation needs:    Medical: Not on file    Non-medical: Not on file  Tobacco Use  . Smoking status: Former Smoker    Packs/day: 0.50    Types: Cigarettes  . Smokeless tobacco: Never Used  . Tobacco comment: last smoke week 3/10  Substance and Sexual Activity  . Alcohol use: Never    Frequency: Never  . Drug use: Never  . Sexual activity: Not on file  Lifestyle  . Physical activity:    Days per week: Not on file    Minutes per session: Not on file  . Stress: Not on file  Relationships  . Social connections:    Talks on phone: Not on file    Gets together: Not on file    Attends religious service: Not on file    Active member of club or organization: Not on file    Attends meetings of clubs or organizations: Not on file    Relationship status: Not on file  Other Topics Concern  . Not on file  Social History Narrative  . Not on file   Family History  Problem Relation Age of Onset  . Cancer Mother        unknown cancer  . Cancer Maternal Aunt        breast ca  . Cancer Maternal Uncle        lung ca  . Cancer Maternal Grandfather        unknown cancer   Scheduled Meds: . amLODipine  10 mg Oral  Daily  . aspirin EC  81 mg Oral Daily  . atorvastatin  40 mg Oral Daily  . benazepril  40 mg Oral Daily  . carvedilol  12.5 mg Oral BID WC  . chlorhexidine  15 mL Mouth Rinse BID  . clopidogrel  75 mg Oral Daily  . hydrALAZINE  25 mg Oral Q8H  . mouth rinse  15 mL Mouth Rinse q12n4p  . multivitamin with minerals  1 tablet Oral  Daily  . polyethylene glycol  17 g Oral BID  . sodium chloride flush  3 mL Intravenous Q12H  . vitamin B-12  1,000 mcg Oral Daily   Continuous Infusions: . sodium chloride     PRN Meds:.sodium chloride, hydrALAZINE, ondansetron, senna, sodium chloride flush, sodium phosphate Medications Prior to Admission:  Prior to Admission medications   Medication Sig Start Date End Date Taking? Authorizing Provider  amLODipine (NORVASC) 10 MG tablet Take 1 tablet (10 mg total) by mouth daily. Patient taking differently: Take 10 mg by mouth daily after breakfast.  12/17/18  Yes [name], MD  aspirin EC 81 MG EC tablet Take 1 tablet (81 mg total) by mouth daily for 17 days. Patient taking differently: Take 81 mg by mouth daily after breakfast.  01/06/19 01/23/19 Yes Kyle, Tyrone A, DO  atorvastatin (LIPITOR) 40 MG tablet Take 40 mg by mouth daily after breakfast.    Yes [provider]  benazepril (LOTENSIN) 40 MG tablet Take 40 mg by mouth daily after breakfast.    Yes [provider]  carvedilol (COREG) 12.5 MG tablet Take 1 tablet (12.5 mg total) by mouth 2 (two) times daily with a meal. 12/22/18  Yes [name], MD  clopidogrel (PLAVIX) 75 MG tablet Take 75 mg by mouth daily after breakfast.  11/30/18  Yes [provider]  hydrALAZINE (APRESOLINE) 25 MG tablet Take 1 tablet (25 mg total) by mouth every 8 (eight) hours for 30 days. 01/05/19 02/04/19 Yes Kyle, Tyrone A, DO  lidocaine-prilocaine (EMLA) cream Apply to affected area once Patient taking differently: Apply 1 application topically See admin instructions. Apply to affected area the night  prior to chemo treatments 11/03/18  Yes Gorsuch, Ni, MD  MAGNESIUM PO Take 1 tablet by mouth daily after breakfast.    Yes [provider]  metFORMIN (GLUCOPHAGE) 500 MG tablet Take 1 tablet (500 mg total) by mouth daily. Patient taking differently: Take 500 mg by mouth daily after breakfast.  01/07/19  Yes [name], MD  Multiple Vitamins-Minerals (MULTIVITAMIN WITH MINERALS) tablet Take 1 tablet by mouth daily.   Yes [provider]  ondansetron (ZOFRAN) 8 MG tablet Take 1 tablet (8 mg total) by mouth every 8 (eight) hours as needed for refractory nausea / vomiting. Start on day 3 after chemo. 11/03/18  Yes Gorsuch, Ni, MD  polyethylene glycol powder (GLYCOLAX/MIRALAX) 17 GM/SCOOP powder Take 17 g by mouth daily as needed for mild constipation or moderate constipation.   Yes [provider]  senna (SENOKOT) 8.6 MG tablet Take 2-3 tablets by mouth daily as needed for constipation.    Yes [provider]  traMADol (ULTRAM) 50 MG tablet Take 50 mg by mouth every 12 (twelve) hours as needed for moderate pain or severe pain.   Yes [provider]  vitamin B-12 (CYANOCOBALAMIN) 1000 MCG tablet Take 1,000 mcg by mouth daily after breakfast.    Yes [provider]   No Known Allergies Review of Systems  Physical Exam Vitals signs and nursing note reviewed.  Cardiovascular:     Rate and Rhythm: Normal rate and regular rhythm.     Pulses: Normal pulses.  Pulmonary:     Effort: Pulmonary effort is normal.  Abdominal:     Palpations: Abdomen is soft. There is mass.  Skin:    General: Skin is warm and dry.  Neurological:     Mental Status: She is alert.     Comments: Aphasic, R hemiplegia  Psychiatric:  Mood and Affect: Mood normal.     Vital Signs: BP (!) 130/50 (BP Location: Right Arm)   Pulse 100   Temp 98.4 F (36.9 C) (Oral)   Resp 16   Ht 5\' 5"  (1.651 m)   Wt 67.2 kg   LMP  (LMP Unknown)   SpO2 98%   BMI 24.65 kg/m   Pain Scale: PAINAD   Pain Score: 0-No pain   SpO2: SpO2: 98 % O2 Device:SpO2: 98 % O2 Flow Rate: .   IO: Intake/output summary:   Intake/Output Summary (Last 24 hours) at 01/12/2019 1226 Last data filed at 01/12/2019 0839 Gross per 24 hour  Intake 550 ml  Output -  Net 550 ml    LBM: Last BM Date: 01/12/19 Baseline Weight: Weight: 69.9 kg Most recent weight: Weight: 67.2 kg     Palliative Assessment/Data: PPS: 30%     Thank you for this consult. Palliative medicine will continue to follow and assist as needed.   Time In: 1200 Time Out: 1330 Time Total: 90 minutes Prolonged services charged: yes Greater than 50%  of this time was spent counseling and coordinating care related to the above assessment and plan.  Signed by: Mariana Kaufman, AGNP-C Palliative Medicine    Please contact Palliative Medicine Team phone at [phone] for questions and concerns.  For individual provider: See Shea Evans

## 2019-01-12 NOTE — ED Notes (Signed)
ED TO INPATIENT HANDOFF REPORT  Name/Age/Gender Katie Myers 73 y.o. female  Code Status    Code Status Orders  (From admission, onward)         Start     Ordered   01/12/19 0053  Full code  Continuous     01/12/19 0054        Code Status History    Date Active Date Inactive Code Status Order ID Comments User Context   12/29/2018 1707 01/06/2019 2326 Full Code [id number]  Georgette Shell, MD ED      Home/SNF/Other Home  Chief Complaint Constipation   Level of Care/Admitting Diagnosis ED Disposition    ED Disposition Condition [place]: [facility] [[id number]]  Level of Care: Med-Surg [16]  Covid Evaluation: N/A  Diagnosis: Constipation [710626]  Admitting Physician: Phillips Grout [[id number]]  Attending Physician: Derrill Kay A [[id number]]  PT Class (Do Not Modify): Observation [104]  PT Acc Code (Do Not Modify): Observation [10022]       Medical History Past Medical History:  Diagnosis Date  . Cancer (HCC)    cervical cancer  . Diabetes mellitus without complication ([place])   . Hyperlipidemia   . Hypertension   . Knee pain, bilateral   . Neuropathy   . Stroke ([place])   . Underweight     Allergies No Known Allergies  IV Location/Drains/Wounds Patient Lines/Drains/Airways Status   Active Line/Drains/Airways    Name:   Placement date:   Placement time:   Site:   Days:   Implanted Port 11/05/18 Right Chest   11/05/18    0915    Chest   68   Peripheral IV 01/11/19 Left Forearm   01/11/19    2055    Forearm   1   Incision (Closed) 11/05/18 Neck Right   11/05/18    0930     68   Incision (Closed) 11/05/18 Chest Right   11/05/18    0930     68   Pressure Injury 12/29/18 Stage II -  Partial thickness loss of dermis presenting as a shallow open ulcer with a red, pink wound bed without slough. 2X2   12/29/18    2045     14   Pressure Injury 12/29/18 Stage II -  Partial thickness loss of dermis presenting as a shallow open  ulcer with a red, pink wound bed without slough.   12/29/18    2045     14          Labs/Imaging Results for orders placed or performed during the hospital encounter of 01/11/19 (from the past 48 hour(s))  Lactic acid, plasma     Status: None   Collection Time: 01/11/19  8:29 PM  Result Value Ref Range   Lactic Acid, Venous 0.8 0.5 - 1.9 mmol/L    Comment: Performed at [facility], [place] [address], [place], [place] [address]  Comprehensive metabolic panel     Status: Abnormal   Collection Time: 01/11/19  8:29 PM  Result Value Ref Range   Sodium 121 (L) 135 - 145 mmol/L   Potassium 3.8 3.5 - 5.1 mmol/L   Chloride 87 (L) 98 - 111 mmol/L   CO2 24 22 - 32 mmol/L   Glucose, Bld 139 (H) 70 - 99 mg/dL   BUN 10 8 - 23 mg/dL   Creatinine, Ser 0.34 (L) 0.44 - 1.00 mg/dL   Calcium 8.4 (L) 8.9 -  10.3 mg/dL   Total Protein 7.0 6.5 - 8.1 g/dL   Albumin 2.3 (L) 3.5 - 5.0 g/dL   AST 23 15 - 41 U/L   ALT 13 0 - 44 U/L   Alkaline Phosphatase 72 38 - 126 U/L   Total Bilirubin 1.0 0.3 - 1.2 mg/dL   GFR calc non Af Amer >60 >60 mL/min   GFR calc Af Amer >60 >60 mL/min   Anion gap 10 5 - 15    Comment: Performed at [facility], [place] [address], [place], [place] [address]  CBC WITH DIFFERENTIAL     Status: Abnormal   Collection Time: 01/11/19  8:29 PM  Result Value Ref Range   WBC 13.6 (H) 4.0 - 10.5 K/uL   RBC 2.49 (L) 3.87 - 5.11 MIL/uL   Hemoglobin 7.8 (L) 12.0 - 15.0 g/dL   HCT 23.3 (L) 36.0 - 46.0 %   MCV 93.6 80.0 - 100.0 fL   MCH 31.3 26.0 - 34.0 pg   MCHC 33.5 30.0 - 36.0 g/dL   RDW 16.9 (H) 11.5 - 15.5 %   Platelets 349 150 - 400 K/uL   nRBC 0.0 0.0 - 0.2 %   Neutrophils Relative % 81 %   Neutro Abs 10.8 (H) 1.7 - 7.7 K/uL   Lymphocytes Relative 12 %   Lymphs Abs 1.7 0.7 - 4.0 K/uL   Monocytes Relative 7 %   Monocytes Absolute 1.0 0.1 - 1.0 K/uL   Eosinophils Relative 0 %   Eosinophils Absolute 0.0 0.0 - 0.5 K/uL   Basophils Relative 0 %    Basophils Absolute 0.1 0.0 - 0.1 K/uL   Immature Granulocytes 0 %   Abs Immature Granulocytes 0.06 0.00 - 0.07 K/uL    Comment: Performed at [facility], [place] [address], [place], [place] [address]  SARS Coronavirus 2 (CEPHEID - Performed in [place] hospital lab), Hosp Order     Status: None   Collection Time: 01/11/19 11:03 PM  Result Value Ref Range   SARS Coronavirus 2 NEGATIVE NEGATIVE    Comment: (NOTE) If result is NEGATIVE SARS-CoV-2 target nucleic acids are NOT DETECTED. The SARS-CoV-2 RNA is generally detectable in upper and lower  respiratory specimens during the acute phase of infection. The lowest  concentration of SARS-CoV-2 viral copies this assay can detect is 250  copies / mL. A negative result does not preclude SARS-CoV-2 infection  and should not be used as the sole basis for treatment or other  patient management decisions.  A negative result may occur with  improper specimen collection / handling, submission of specimen other  than nasopharyngeal swab, presence of viral mutation(s) within the  areas targeted by this assay, and inadequate number of viral copies  (<250 copies / mL). A negative result must be combined with clinical  observations, patient history, and epidemiological information. If result is POSITIVE SARS-CoV-2 target nucleic acids are DETECTED. The SARS-CoV-2 RNA is generally detectable in upper and lower  respiratory specimens dur ing the acute phase of infection.  Positive  results are indicative of active infection with SARS-CoV-2.  Clinical  correlation with patient history and other diagnostic information is  necessary to determine patient infection status.  Positive results do  not rule out bacterial infection or co-infection with other viruses. If result is PRESUMPTIVE POSTIVE SARS-CoV-2 nucleic acids MAY BE PRESENT.   A presumptive positive result was obtained on the submitted specimen  and confirmed on repeat  testing.  While 2019 novel coronavirus  (SARS-CoV-2) nucleic acids may be present in the submitted sample  additional confirmatory testing may be necessary for epidemiological  and / or clinical management purposes  to differentiate between  SARS-CoV-2 and other Sarbecovirus currently known to infect humans.  If clinically indicated additional testing with an alternate test  methodology [phone]) is advised. The SARS-CoV-2 RNA is generally  detectable in upper and lower respiratory sp ecimens during the acute  phase of infection. The expected result is Negative. Fact Sheet for Patients:  StrictlyIdeas.no Fact Sheet for Healthcare Providers: BankingDealers.co.za This test is not yet approved or cleared by the [place] FDA and has been authorized for detection and/or diagnosis of SARS-CoV-2 by FDA under an Emergency Use Authorization (EUA).  This EUA will remain in effect (meaning this test can be used) for the duration of the COVID-19 declaration under Section 564(b)(1) of the Act, 21 U.S.C. section 360bbb-3(b)(1), unless the authorization is terminated or revoked sooner. Performed at [facility], [place] [address], [place], [place] [address]   D-dimer, quantitative     Status: Abnormal   Collection Time: 01/11/19 11:59 PM  Result Value Ref Range   D-Dimer, Quant 2.92 (H) 0.00 - 0.50 ug/mL-FEU    Comment: (NOTE) At the manufacturer cut-off of 0.50 ug/mL FEU, this assay has been documented to exclude PE with a sensitivity and negative predictive value of 97 to 99%.  At this time, this assay has not been approved by the FDA to exclude DVT/VTE. Results should be correlated with clinical presentation. Performed at [facility], [place] [address] [place], [place] [address]   Procalcitonin     Status: None   Collection Time: 01/11/19 11:59 PM  Result Value Ref Range   Procalcitonin <0.10 ng/mL     Comment:        Interpretation: PCT (Procalcitonin) <= 0.5 ng/mL: Systemic infection (sepsis) is not likely. Local bacterial infection is possible. (NOTE)       Sepsis PCT Algorithm           Lower Respiratory Tract                                      Infection PCT Algorithm    ----------------------------     ----------------------------         PCT < 0.25 ng/mL                PCT < 0.10 ng/mL         Strongly encourage             Strongly discourage   discontinuation of antibiotics    initiation of antibiotics    ----------------------------     -----------------------------       PCT 0.25 - 0.50 ng/mL            PCT 0.10 - 0.25 ng/mL               OR       >80% decrease in PCT            Discourage initiation of                                            antibiotics      Encourage discontinuation  of antibiotics    ----------------------------     -----------------------------         PCT >= 0.50 ng/mL              PCT 0.26 - 0.50 ng/mL               AND        <80% decrease in PCT             Encourage initiation of                                             antibiotics       Encourage continuation           of antibiotics    ----------------------------     -----------------------------        PCT >= 0.50 ng/mL                  PCT > 0.50 ng/mL               AND         increase in PCT                  Strongly encourage                                      initiation of antibiotics    Strongly encourage escalation           of antibiotics                                     -----------------------------                                           PCT <= 0.25 ng/mL                                                 OR                                        > 80% decrease in PCT                                     Discontinue / Do not initiate                                             antibiotics Performed at [place] [address], [place], [place] [address]   Lactate dehydrogenase     Status: Abnormal   Collection Time: 01/11/19 11:59 PM  Result Value Ref Range   LDH 247 (H) 98 - 192 U/L  Comment: Performed at [facility], [place] [address], [place], [place] [address]  Ferritin     Status: Abnormal   Collection Time: 01/11/19 11:59 PM  Result Value Ref Range   Ferritin 366 (H) 11 - 307 ng/mL    Comment: Performed at [facility], [place] [address], [place], [place] [address]  Triglycerides     Status: None   Collection Time: 01/11/19 11:59 PM  Result Value Ref Range   Triglycerides 53 <150 mg/dL    Comment: Performed at [facility], [place] [address] [place], [place] [address]  Fibrinogen     Status: Abnormal   Collection Time: 01/11/19 11:59 PM  Result Value Ref Range   Fibrinogen 611 (H) 210 - 475 mg/dL    Comment: Performed at [facility], [place] [address], [place], [place] [address]  C-reactive protein     Status: Abnormal   Collection Time: 01/11/19 11:59 PM  Result Value Ref Range   CRP 3.6 (H) <1.0 mg/dL    Comment: Performed at [facility], [place] [address], [place], [place] [address]   Dg Chest 2 View  Result Date: 01/11/2019 CLINICAL DATA:  Pleural effusion EXAM: CHEST - 2 VIEW COMPARISON:  01/03/2019 FINDINGS: Cardiac shadow is stable. Aortic calcifications are seen. Right chest wall port is noted in satisfactory position. Lungs are well aerated bilaterally with patchy infiltrates bilaterally relatively stable from the prior exam. No new focal abnormality is seen. Large right pleural effusion is noted. IMPRESSION: Stable bilateral infiltrates. Large right pleural effusion is noted. Electronically Signed   By: Inez Catalina M.D.   On: 01/11/2019 23:10   Ct Abdomen Pelvis W Contrast  Result Date: 01/11/2019 CLINICAL DATA:  Constipation EXAM: CT ABDOMEN AND PELVIS WITH CONTRAST TECHNIQUE: Multidetector CT imaging  of the abdomen and pelvis was performed using the standard protocol following bolus administration of intravenous contrast. CONTRAST:  181mL OMNIPAQUE IOHEXOL 300 MG/ML  SOLN COMPARISON:  CT dated 07/23/2018. FINDINGS: Lower chest: There is an at least moderate sized partially visualized right-sided pleural effusion. There is a trace left-sided pleural effusion. Mitral valve calcifications are noted. There is interlobular septal thickening bilaterally. There are likely a few pulmonary nodules. Hepatobiliary: There is cholelithiasis without CT evidence of acute cholecystitis. The liver is unremarkable. Pancreas: Unremarkable. No pancreatic ductal dilatation or surrounding inflammatory changes. Spleen: Normal in size without focal abnormality. Adrenals/Urinary Tract: Adrenal glands are unremarkable. Kidneys are normal, without renal calculi, focal lesion, or hydronephrosis. Bladder is unremarkable. Stomach/Bowel: There is a large amount of stool throughout the colon. The appendix is unremarkable. There is no evidence of a small-bowel obstruction. Oral contrast is seen throughout the level of the colon. Vascular/Lymphatic: Diffuse atherosclerotic changes are noted of the abdominal aorta. The left common iliac artery may be occluded proximally. There are enlarged retroperitoneal lymph nodes. There is enlarged partially visualized left axillary lymph node. Reproductive: There is a large complex calcified mass with pockets of gas. This likely represents the patient's known cervical cancer. The mass currently measures approximately 9.1 x 8.8 cm. Pulmonary mass previously measured approximately 11 x 8.6 cm. Other: CT abdomen there is diffuse body wall edema. There is a small bowel and fat containing umbilical hernia without evidence of an obstruction. Diffuse mesenteric edema is noted. Musculoskeletal: No acute or significant osseous findings. IMPRESSION: 1. Large amount of stool throughout the colon. Normal appendix in the  right lower quadrant. No pneumatosis or free air. No bowel obstruction.  2. Large complex pelvic mass likely consistent with the patient's known cervical cancer. Pathologically enlarged inguinal and retroperitoneal lymph nodes are again noted. 3. Interval development of a moderate size right-sided pleural effusion. There is interlobular septal thickening consistent with volume overload. 4. Body wall and mesenteric edema. 5. Cholelithiasis without CT evidence of acute cholecystitis. Aortic Atherosclerosis (ICD10-I70.0). Electronically Signed   By: Constance Holster M.D.   On: 01/11/2019 22:17    Pending Labs Unresulted Labs (From admission, onward)    Start     Ordered   01/12/19 0500  CBC  Tomorrow morning,   R     01/12/19 0054   01/12/19 9169  Basic metabolic panel  Tomorrow morning,   R     01/12/19 0054   01/11/19 2029  Blood Culture (routine x 2)  BLOOD CULTURE X 2,   STAT     01/11/19 2030   01/11/19 2029  Urinalysis, Routine w reflex microscopic  ONCE - STAT,   STAT     01/11/19 2030          Vitals/Pain Today's Vitals   01/11/19 2330 01/12/19 0000 01/12/19 0030 01/12/19 0130  BP: (!) 144/64 (!) 146/69 (!) 146/58 (!) 151/66  Pulse: 86 83 80 87  Resp: 16 16 16 17   Temp:      TempSrc:      SpO2: 95% 96% 95% 96%  Weight:      PainSc:        Isolation Precautions No active isolations  Medications Medications  sodium chloride (PF) 0.9 % injection (has no administration in time range)  sodium phosphate (FLEET) 7-19 GM/118ML enema 1 enema (1 enema Rectal Given 01/12/19 0051)  atorvastatin (LIPITOR) tablet 40 mg (has no administration in time range)  benazepril (LOTENSIN) tablet 40 mg (has no administration in time range)  vitamin B-12 (CYANOCOBALAMIN) tablet 1,000 mcg (has no administration in time range)  multivitamin with minerals tablet 1 tablet (has no administration in time range)  lidocaine-prilocaine (EMLA) cream 1 application (has no administration in time range)   ondansetron (ZOFRAN) tablet 8 mg (has no administration in time range)  amLODipine (NORVASC) tablet 10 mg (has no administration in time range)  clopidogrel (PLAVIX) tablet 75 mg (has no administration in time range)  carvedilol (COREG) tablet 12.5 mg (has no administration in time range)  polyethylene glycol powder (GLYCOLAX/MIRALAX) container 17 g (has no administration in time range)  senna (SENOKOT) tablet 17.2-25.8 mg (has no administration in time range)  aspirin EC tablet 81 mg (has no administration in time range)  hydrALAZINE (APRESOLINE) tablet 25 mg (has no administration in time range)  sodium chloride flush (NS) 0.9 % injection 3 mL (has no administration in time range)  sodium chloride flush (NS) 0.9 % injection 3 mL (has no administration in time range)  0.9 %  sodium chloride infusion (has no administration in time range)  sodium chloride 0.9 % bolus 500 mL (0 mLs Intravenous Stopped 01/11/19 2325)  iohexol (OMNIPAQUE) 300 MG/ML solution 100 mL (100 mLs Intravenous Contrast Given 01/11/19 2155)    Mobility non-ambulatory

## 2019-01-12 NOTE — Progress Notes (Signed)
[facility] PROGRESS NOTE    Progress Note  Katie Myers  JSH:[id number] DOB: 1945/10/28 DOA: 01/11/2019 PCP: [name], MD     Brief Narrative:   Katie Myers is an 73 y.o. female past medical history of recent stroke dysphagia hypertension diabetes mellitus type 2, advanced cervical cancer, who had a recent acute cortical infarct to the left anterior and middle cerebral territory, which neurologist was consulted who recommended comfort care, palliative are has been following the patient at home and she remains a full code presents to the hospital for constipation.  During her last hospitalization she was treated for healthcare associated pneumonia.  Assessment/Plan:   Constipation: She was started on Fleet enemas and MiraLAX. She was admitted after midnight. Continue these for the next 24 hours.  Metastatic cervical cancer to bone and lung: Follow-up with oncology as an outpatient.  Physical debility secondary to massive previous stroke: See below for further details.  Essential hypertension Continue current home regimen.  History of stroke Continue aspirin and Plavix.  Anemia in neoplastic disease Hemoglobin at baseline.  Depression on admission pressure injury of skin   DVT prophylaxis: lovenox Family Communication:daughter Disposition Plan/Barrier to D/C: hopefully tmrw after she has BM Code Status:     Code Status Orders  (From admission, onward)         Start     Ordered   01/12/19 0053  Full code  Continuous     01/12/19 0054        Code Status History    Date Active Date Inactive Code Status Order ID Comments User Context   12/29/2018 1707 01/06/2019 2326 Full Code [id number]  Georgette Shell, MD ED    Advance Directive Documentation     Most Recent Value  Type of Advance Directive  Healthcare Power of [place], Living will  Pre-existing out of facility DNR order (yellow form or pink MOST form)  --  "MOST" Form in Place?  --         IV Access:    Peripheral IV   Procedures and diagnostic studies:   Dg Chest 2 View  Result Date: 01/11/2019 CLINICAL DATA:  Pleural effusion EXAM: CHEST - 2 VIEW COMPARISON:  01/03/2019 FINDINGS: Cardiac shadow is stable. Aortic calcifications are seen. Right chest wall port is noted in satisfactory position. Lungs are well aerated bilaterally with patchy infiltrates bilaterally relatively stable from the prior exam. No new focal abnormality is seen. Large right pleural effusion is noted. IMPRESSION: Stable bilateral infiltrates. Large right pleural effusion is noted. Electronically Signed   By: Inez Catalina M.D.   On: 01/11/2019 23:10   Ct Abdomen Pelvis W Contrast  Result Date: 01/11/2019 CLINICAL DATA:  Constipation EXAM: CT ABDOMEN AND PELVIS WITH CONTRAST TECHNIQUE: Multidetector CT imaging of the abdomen and pelvis was performed using the standard protocol following bolus administration of intravenous contrast. CONTRAST:  151mL OMNIPAQUE IOHEXOL 300 MG/ML  SOLN COMPARISON:  CT dated 07/23/2018. FINDINGS: Lower chest: There is an at least moderate sized partially visualized right-sided pleural effusion. There is a trace left-sided pleural effusion. Mitral valve calcifications are noted. There is interlobular septal thickening bilaterally. There are likely a few pulmonary nodules. Hepatobiliary: There is cholelithiasis without CT evidence of acute cholecystitis. The liver is unremarkable. Pancreas: Unremarkable. No pancreatic ductal dilatation or surrounding inflammatory changes. Spleen: Normal in size without focal abnormality. Adrenals/Urinary Tract: Adrenal glands are unremarkable. Kidneys are normal, without renal calculi, focal lesion, or hydronephrosis. Bladder is unremarkable. Stomach/Bowel: There  is a large amount of stool throughout the colon. The appendix is unremarkable. There is no evidence of a small-bowel obstruction. Oral contrast is seen throughout the level of the colon.  Vascular/Lymphatic: Diffuse atherosclerotic changes are noted of the abdominal aorta. The left common iliac artery may be occluded proximally. There are enlarged retroperitoneal lymph nodes. There is enlarged partially visualized left axillary lymph node. Reproductive: There is a large complex calcified mass with pockets of gas. This likely represents the patient's known cervical cancer. The mass currently measures approximately 9.1 x 8.8 cm. Pulmonary mass previously measured approximately 11 x 8.6 cm. Other: CT abdomen there is diffuse body wall edema. There is a small bowel and fat containing umbilical hernia without evidence of an obstruction. Diffuse mesenteric edema is noted. Musculoskeletal: No acute or significant osseous findings. IMPRESSION: 1. Large amount of stool throughout the colon. Normal appendix in the right lower quadrant. No pneumatosis or free air. No bowel obstruction. 2. Large complex pelvic mass likely consistent with the patient's known cervical cancer. Pathologically enlarged inguinal and retroperitoneal lymph nodes are again noted. 3. Interval development of a moderate size right-sided pleural effusion. There is interlobular septal thickening consistent with volume overload. 4. Body wall and mesenteric edema. 5. Cholelithiasis without CT evidence of acute cholecystitis. Aortic Atherosclerosis (ICD10-I70.0). Electronically Signed   By: Constance Holster M.D.   On: 01/11/2019 22:17     Medical Consultants:    None.  Anti-Infectives:   None  Subjective:    Katie Myers non-verbal  Objective:    Vitals:   01/12/19 0209 01/12/19 0348 01/12/19 0629 01/12/19 0824  BP: (!) 162/73 (!) 154/68 (!) 169/75 (!) 130/50  Pulse: 89 87 87 100  Resp: 20 19 19 16   Temp: 97.7 F (36.5 C) 97.9 F (36.6 C) 98 F (36.7 C) 98.4 F (36.9 C)  TempSrc: Oral Oral  Oral  SpO2: 98% 99% 99% 98%  Weight:   67.2 kg   Height:   5\' 5"  (1.651 m)     Intake/Output Summary (Last 24 hours)  at 01/12/2019 1140 Last data filed at 01/12/2019 0839 Gross per 24 hour  Intake 550 ml  Output --  Net 550 ml   Filed Weights   01/11/19 1928 01/12/19 0629  Weight: 69.9 kg 67.2 kg    Exam: General exam: In no acute distress. Respiratory system: Good air movement and clear to auscultation. Cardiovascular system: S1 & S2 heard, RRR.  Gastrointestinal system: Abdomen is nondistended, soft and nontender.  Extremities: No pedal edema. Skin: No rashes, lesions or ulcers  Data Reviewed:    Labs: Basic Metabolic Panel: Recent Labs  Lab 01/05/19 1700 01/11/19 2029 01/12/19 0530  NA  --  121* 122*  K  --  3.8 4.4  CL  --  87* 89*  CO2  --  24 20*  GLUCOSE  --  139* 122*  BUN  --  10 8  CREATININE  --  0.34* <0.30*  CALCIUM  --  8.4* 8.2*  MG 3.2*  --   --    GFR CrCl cannot be calculated (This lab value cannot be used to calculate CrCl because it is not a number: <0.30). Liver Function Tests: Recent Labs  Lab 01/11/19 2029  AST 23  ALT 13  ALKPHOS 72  BILITOT 1.0  PROT 7.0  ALBUMIN 2.3*   No results for input(s): LIPASE, AMYLASE in the last 168 hours. No results for input(s): AMMONIA in the last 168 hours. Coagulation profile  No results for input(s): INR, PROTIME in the last 168 hours. COVID-19 Labs  Recent Labs    01/11/19 2359  DDIMER 2.92*  FERRITIN 366*  LDH 247*  CRP 3.6*    Lab Results  Component Value Date   SARSCOV2NAA NEGATIVE 01/11/2019   [place] NEGATIVE 12/29/2018    CBC: Recent Labs  Lab 01/11/19 2029 01/12/19 0530  WBC 13.6* 13.0*  NEUTROABS 10.8*  --   HGB 7.8* 8.9*  HCT 23.3* 26.6*  MCV 93.6 91.7  PLT 349 185   Cardiac Enzymes: No results for input(s): CKTOTAL, CKMB, CKMBINDEX, TROPONINI in the last 168 hours. BNP (last 3 results) No results for input(s): PROBNP in the last 8760 hours. CBG: Recent Labs  Lab 01/05/19 1815 01/05/19 2139 01/06/19 0600 01/06/19 1118 01/06/19 1701  GLUCAP 143* 143* 131* 203* 198*    D-Dimer: Recent Labs    01/11/19 2359  DDIMER 2.92*   Hgb A1c: No results for input(s): HGBA1C in the last 72 hours. Lipid Profile: Recent Labs    01/11/19 2359  TRIG 53   Thyroid function studies: No results for input(s): TSH, T4TOTAL, T3FREE, THYROIDAB in the last 72 hours.  Invalid input(s): FREET3 Anemia work up: Recent Labs    01/11/19 2359  FERRITIN 366*   Sepsis Labs: Recent Labs  Lab 01/11/19 2029 01/11/19 2359 01/12/19 0530  PROCALCITON  --  <0.10  --   WBC 13.6*  --  13.0*  LATICACIDVEN 0.8  --   --    Microbiology Recent Results (from the past 240 hour(s))  Blood Culture (routine x 2)     Status: None (Preliminary result)   Collection Time: 01/11/19  8:29 PM  Result Value Ref Range Status   Specimen Description   Final    BLOOD LEFT ANTECUBITAL Performed at [facility], [place] [address], [place], [place] [address]    Special Requests   Final    BOTTLES DRAWN AEROBIC AND ANAEROBIC Blood Culture results may not be optimal due to an inadequate volume of blood received in culture bottles Performed at [facility] [address] [place], [place] [address]    Culture   Final    NO GROWTH < 12 HOURS Performed at [facility] [address], [place], [place] [address]    Report Status PENDING  Incomplete  Blood Culture (routine x 2)     Status: None (Preliminary result)   Collection Time: 01/11/19  8:34 PM  Result Value Ref Range Status   Specimen Description   Final    BLOOD RIGHT HAND Performed at [facility] [address] [place], [place] [address]    Special Requests   Final    BOTTLES DRAWN AEROBIC AND ANAEROBIC Blood Culture results may not be optimal due to an inadequate volume of blood received in culture bottles Performed at [facility] [address], [place], [place] [address]    Culture   Final    NO GROWTH < 12 HOURS Performed at [facility] [address], [place],  [address]    Report Status PENDING  Incomplete  SARS Coronavirus 2 (CEPHEID - Performed in [place] hospital lab), Hosp Order     Status: None   Collection Time: 01/11/19 11:03 PM  Result Value Ref Range Status   SARS Coronavirus 2 NEGATIVE NEGATIVE Final    Comment: (NOTE) If result is NEGATIVE SARS-CoV-2 target nucleic acids are NOT DETECTED. The  SARS-CoV-2 RNA is generally detectable in upper and lower  respiratory specimens during the acute phase of infection. The lowest  concentration of SARS-CoV-2 viral copies this assay can detect is 250  copies / mL. A negative result does not preclude SARS-CoV-2 infection  and should not be used as the sole basis for treatment or other  patient management decisions.  A negative result may occur with  improper specimen collection / handling, submission of specimen other  than nasopharyngeal swab, presence of viral mutation(s) within the  areas targeted by this assay, and inadequate number of viral copies  (<250 copies / mL). A negative result must be combined with clinical  observations, patient history, and epidemiological information. If result is POSITIVE SARS-CoV-2 target nucleic acids are DETECTED. The SARS-CoV-2 RNA is generally detectable in upper and lower  respiratory specimens dur ing the acute phase of infection.  Positive  results are indicative of active infection with SARS-CoV-2.  Clinical  correlation with patient history and other diagnostic information is  necessary to determine patient infection status.  Positive results do  not rule out bacterial infection or co-infection with other viruses. If result is PRESUMPTIVE POSTIVE SARS-CoV-2 nucleic acids MAY BE PRESENT.   A presumptive positive result was obtained on the submitted specimen  and confirmed on repeat testing.  While 2019 novel coronavirus  (SARS-CoV-2) nucleic acids may be present in the submitted sample  additional  confirmatory testing may be necessary for epidemiological  and / or clinical management purposes  to differentiate between  SARS-CoV-2 and other Sarbecovirus currently known to infect humans.  If clinically indicated additional testing with an alternate test  methodology [phone]) is advised. The SARS-CoV-2 RNA is generally  detectable in upper and lower respiratory sp ecimens during the acute  phase of infection. The expected result is Negative. Fact Sheet for Patients:  StrictlyIdeas.no Fact Sheet for Healthcare Providers: BankingDealers.co.za This test is not yet approved or cleared by the [place] FDA and has been authorized for detection and/or diagnosis of SARS-CoV-2 by FDA under an Emergency Use Authorization (EUA).  This EUA will remain in effect (meaning this test can be used) for the duration of the COVID-19 declaration under Section 564(b)(1) of the Act, 21 U.S.C. section 360bbb-3(b)(1), unless the authorization is terminated or revoked sooner. Performed at [facility], [place] [address], [place], [place] [address]      Medications:    amLODipine  10 mg Oral Daily   aspirin EC  81 mg Oral Daily   atorvastatin  40 mg Oral Daily   benazepril  40 mg Oral Daily   carvedilol  12.5 mg Oral BID WC   chlorhexidine  15 mL Mouth Rinse BID   clopidogrel  75 mg Oral Daily   hydrALAZINE  25 mg Oral Q8H   mouth rinse  15 mL Mouth Rinse q12n4p   multivitamin with minerals  1 tablet Oral Daily   polyethylene glycol  17 g Oral q morning - 10a   sodium chloride flush  3 mL Intravenous Q12H   vitamin B-12  1,000 mcg Oral Daily   Continuous Infusions:  sodium chloride       LOS: 0 days   Charlynne Cousins  [facility]  01/12/2019, 11:40 AM

## 2019-01-13 ENCOUNTER — Telehealth: Payer: Self-pay | Admitting: Family Medicine

## 2019-01-13 DIAGNOSIS — K5909 Other constipation: Secondary | ICD-10-CM | POA: Diagnosis not present

## 2019-01-13 DIAGNOSIS — C770 Secondary and unspecified malignant neoplasm of lymph nodes of head, face and neck: Secondary | ICD-10-CM

## 2019-01-13 DIAGNOSIS — C7800 Secondary malignant neoplasm of unspecified lung: Secondary | ICD-10-CM

## 2019-01-13 DIAGNOSIS — D630 Anemia in neoplastic disease: Secondary | ICD-10-CM | POA: Diagnosis not present

## 2019-01-13 DIAGNOSIS — C539 Malignant neoplasm of cervix uteri, unspecified: Secondary | ICD-10-CM

## 2019-01-13 DIAGNOSIS — I10 Essential (primary) hypertension: Secondary | ICD-10-CM | POA: Diagnosis not present

## 2019-01-13 MED ORDER — SENNOSIDES-DOCUSATE SODIUM 8.6-50 MG PO TABS: 1.0000 | ORAL_TABLET | Freq: Two times a day (BID) | ORAL | 1 refills | Status: AC

## 2019-01-13 NOTE — Telephone Encounter (Signed)
Jen from [employer] calling to check status of attending of record for the patient.  Jen expressed she cannot see patient until she get this.  Call back # [phone]

## 2019-01-13 NOTE — Progress Notes (Signed)
Patient having difficulty chewing food on Dysphagia 3 diet. Dr. Jackqulyn Livings notified with new orders received for Dysphagia 2 diet and SLP evaluation. Dietary notified of changes.

## 2019-01-13 NOTE — TOC Transition Note (Addendum)
Transition of Care [facility]) - CM/SW Discharge Note   Patient Details  Name: Katie Myers MRN: [id number] Date of Birth: 1946/07/18  [facility] CM/SW Contact:  Servando Snare, LCSW Phone Number: 01/13/2019, 9:40 AM   Clinical Narrative:   Patient to dc home with hospice. LCSW made referral Hospice referral. Packet on patient chart. LCSW will call PTAR when dc summary is available.   3:21 PM LCSW spoke with attending. Awaiting speech eval. Per floor RN speech is on the way to complete eval. LCSW arranged PTAR for 17:30.   Final next level of care: Home w Hospice Care Barriers to Discharge: No Barriers Identified   Patient Goals and CMS Choice Patient states their goals for this hospitalization and ongoing recovery are:: Home with Hospice   Choice offered to / list presented to : NA  Discharge Placement                       Discharge Plan and Services                                     Social Determinants of Health (SDOH) Interventions     Readmission Risk Interventions Readmission Risk Prevention Plan 01/06/2019 01/05/2019  Transportation Screening - Complete  Medication Review Press photographer) - Complete  HRI or [place] - Complete  SW Recovery Care/Counseling Consult - Complete  Palliative Care Screening Complete Not [place] Patient Refused -  Some recent data might be hidden

## 2019-01-13 NOTE — Evaluation (Signed)
Clinical/Bedside Swallow Evaluation Patient Details  Name: Katie Myers MRN: [id number] Date of Birth: Apr 20, 1946  Today's Date: 01/13/2019 Time: SLP Start Time (ACUTE ONLY): 71 SLP Stop Time (ACUTE ONLY): 1600 SLP Time Calculation (min) (ACUTE ONLY): 50 min  Past Medical History:  Past Medical History:  Diagnosis Date  . Cancer (HCC)    cervical cancer  . Diabetes mellitus without complication ([place])   . Hyperlipidemia   . Hypertension   . Knee pain, bilateral   . Neuropathy   . Stroke ([place])   . Underweight    Past Surgical History:  Past Surgical History:  Procedure Laterality Date  . IR IMAGING GUIDED PORT INSERTION  11/05/2018   HPI:  73 year old female presented hospital with shortness of breath and inability to express herself.  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  She had severe dyphasia and moderate dysphagia.  Pt now readmitted and swallow eval reordered as she is to go home with hospice.  Pt diet was changed from dys3/thin to dys2/thin during this hospital coarse. RN reports pt with excessive oral pocketing.     Assessment / Plan / Recommendation Clinical Impression  Pt demonstrates signs of oral dysphagia with hypoglossal, glossopharyngeal, facial and trigeminal nerve involvement from prior CVA.  Pt continues to be able to feed herself finger foods especially.  Minimal oral residuals with solids that are effectively cleared with either lingual sweep and dry swallow or intake of pudding/puree to transit.  Cough x2/7 boluses noted with liquids via sequential straw swallows.  Single straw swallows *SlP pinching straw* effective for airway protection.  Detailed written instructions re: diet, compensations, comfort feeding left for pt 's family as well as comfort  feeding handout.  Thanks for this referra.   SLP Visit Diagnosis: Dysphagia, oropharyngeal phase (R13.12)    Aspiration Risk  Mild aspiration risk    Diet Recommendation Dysphagia 3 (Mech soft);Thin liquid   Liquid Administration via: Cup;Straw Medication Administration: Crushed with puree Supervision: Staff to assist with self feeding Compensations: Slow rate;Small sips/bites;Lingual sweep for clearance of pocketing(use puree to help clear oral residuals) Postural Changes: Remain upright for at least 30 minutes after po intake;Seated upright at 90 degrees    Other  Recommendations Oral Care Recommendations: Oral care before and after PO   Follow up Recommendations None      Frequency and Duration   n/a         Prognosis   n/a     Swallow Study   General Date of Onset: 01/13/19 HPI: 73 year old female presented hospital with shortness of breath and inability to express herself.  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  She had severe dyphasia and moderate dysphagia.  Pt now readmitted and swallow eval reordered as she is to go home with hospice.  Pt diet was changed from dys3/thin to dys2/thin during this hospital coarse. RN reports pt with excessive oral pocketing.   Type of Study: Bedside Swallow Evaluation Diet Prior to this Study: Dysphagia 2 (chopped);Thin liquids Temperature Spikes Noted: No Respiratory Status: Room air History of Recent Intubation:  No Behavior/Cognition: Alert;Cooperative;Pleasant mood;Doesn't follow directions Oral Cavity Assessment: Excessive secretions Oral Care Completed by SLP: Yes Oral Cavity - Dentition: Poor condition Vision: Functional for self-feeding Self-Feeding Abilities: Needs assist Patient Positioning: Upright in  bed Baseline Vocal Quality: Normal Volitional Cough: Cognitively unable to elicit Volitional Swallow: Unable to elicit    Oral/Motor/Sensory Function Overall Oral Motor/Sensory Function: Moderate impairment Facial ROM: Reduced right Facial Symmetry: Abnormal symmetry right Facial Strength: Reduced right Facial Sensation: Reduced right Lingual ROM: Reduced right Lingual Strength: Reduced Lingual Sensation: Reduced Velum: Other (comment)(did not observe bilateral velar closure, pt did not voice to observe) Mandible: Other (Comment)(dnt)   Ice Chips Ice chips: Not tested   Thin Liquid Thin Liquid: Impaired Presentation: Straw Oral Phase Impairments: Reduced labial seal Oral Phase Functional Implications: Right anterior spillage Pharyngeal  Phase Impairments: Cough - Immediate;Suspected delayed Swallow Other Comments: cough x1/8 boluses due to sequential swallows, small single boluses via straw *SLP pinched straw* was effective to prevent overt aspiration     Nectar Thick Nectar Thick Liquid: Not tested   Honey Thick Honey Thick Liquid: Not tested   Puree Puree: Impaired Presentation: Spoon Oral Phase Impairments: Reduced labial seal;Reduced lingual movement/coordination Oral Phase Functional Implications: Prolonged oral transit Other Comments: no oral pocketing noted   Solid     Solid: Impaired Presentation: Self Fed Oral Phase Impairments: Reduced lingual movement/coordination;Reduced labial seal Oral Phase Functional Implications: Prolonged oral transit;Oral residue;Impaired mastication Pharyngeal Phase Impairments: Suspected delayed Swallow Other Comments: pt used lingual sweep to help clear oral residuals, also pudding bolus effective to clear as well      Katie Myers 01/13/2019,4:23 PM    Luanna Salk, MS [facility] SLP Acute Rehab Services Pager [phone] Office [phone]

## 2019-01-13 NOTE — Progress Notes (Addendum)
Manufacturing engineer [facility]) Hospice Referral  Received referral for home hospice services once discharged later today.  Patient and chart under review by [facility] MD and eligibility is pending at this time.  Spoke with [name] to confirm services.  DME discussed, pt already has hospital bed, wheel chair provided by [facility] would like to have a hoyer lift to assist with care.  Will order from [employer].  She does not need this prior to discharging home.    Plan is to discharge home via [place] later today.  Please send completed DNR form home with pt.  Please send any medications that may be needed for comfort.  **Update, 1245, pt is eligible for hospice services at home  Thank you for this referral, Venia Carbon RN, BSN, [facility] (in [place] under [facility] and [facility] [place]) [phone]

## 2019-01-13 NOTE — Discharge Summary (Signed)
Physician Discharge Summary  Katie Myers IFO:[id number] DOB: 06/21/1946 DOA: 01/11/2019  PCP: [name], MD  Admit date: 01/11/2019 Discharge date: 01/13/2019  Admitted From: Home  Disposition:  Home with Hospice   Recommendations for Outpatient Follow-up:  1. Follow up BM frequency and titrate bowel regimen as needed   Home Health: [facility]  Equipment/Devices: None  Discharge Condition: Fair  CODE STATUS: DO NOT RESUSCITATE Diet recommendation: Mechanical soft  Brief/Interim Summary: Katie Myers is a 73 y.o. F with advanced cervical cancer, recent stroke, now right sided hemiplegia and severe dysphagia, aphasia, as well as DM and HTN who presented with constipation, vomiting.       PRINCIPAL HOSPITAL DIAGNOSIS: Constipation    Discharge Diagnoses:   Patient was treated with MiraLAX and Fleet enema.  Had large BM. Regimen titrated up to MiraLAX BID and Senokot-S BID. 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  Recent Stroke Dysphagia SLP evaluated and recommended mechanical soft diet.  Hypertension  Pressure injury to skin, POA         Discharge Instructions  Discharge Instructions    Discharge instructions   Complete by:  As directed    From Dr. Loleta Books: Katie Myers was admitted with constipation. In the hospital, we gave her MiraLAX, senna-docusate and a Fleet enema and she had a bowel movement.  From now on: Firstly, make sure she drinks enough water.  No amount of medicine will ultimately help if she isn't drinking any water to stay hydrated. Secondly, continue MiraLAX 17g packet twice daily (ensure she drinks the WHOLE thing) Third, start Senna-docusate 1 tab twice daily If This doesn't work, go to two tabs twice daily  Follow up with [facility]   Increase activity slowly   Complete by:  As directed      Allergies as of 01/13/2019    No Known Allergies     Medication List    STOP taking these medications   senna 8.6 MG tablet Commonly known as:  SENOKOT     TAKE these medications   amLODipine 10 MG tablet Commonly known as:  NORVASC Take 1 tablet (10 mg total) by mouth daily. What changed:  when to take this   aspirin 81 MG EC tablet Take 1 tablet (81 mg total) by mouth daily for 17 days. What changed:  when to take this   atorvastatin 40 MG tablet Commonly known as:  LIPITOR Take 40 mg by mouth daily after breakfast.   benazepril 40 MG tablet Commonly known as:  LOTENSIN Take 40 mg by mouth daily after breakfast.   carvedilol 12.5 MG tablet Commonly known as:  COREG Take 1 tablet (12.5 mg total) by mouth 2 (two) times daily with a meal.   clopidogrel 75 MG tablet Commonly known as:  PLAVIX Take 75 mg by mouth daily after breakfast.   hydrALAZINE 25 MG tablet Commonly known as:  APRESOLINE Take 1 tablet (25 mg total) by mouth every 8 (eight) hours for 30 days.   lidocaine-prilocaine cream Commonly known as:  EMLA Apply to affected area once What changed:    how much to take  how to take this  when to take this  additional instructions   MAGNESIUM PO Take 1 tablet by mouth daily after breakfast.   metFORMIN 500 MG tablet Commonly known as:  GLUCOPHAGE Take 1 tablet (500 mg total) by mouth daily. What changed:  when to take this   multivitamin with minerals tablet Take 1 tablet by mouth daily.   ondansetron 8 MG tablet Commonly known as:  Zofran Take 1 tablet (8 mg total) by mouth every 8 (eight) hours as needed for refractory nausea / vomiting. Start on day 3 after chemo.   polyethylene glycol powder 17 GM/SCOOP powder Commonly known as:  GLYCOLAX/MIRALAX Take 17 g by mouth daily as needed for mild constipation or moderate constipation.   senna-docusate 8.6-50 MG tablet Commonly known as:  Senokot-S Take 1 tablet by mouth 2 (two) times daily.   traMADol 50 MG  tablet Commonly known as:  ULTRAM Take 50 mg by mouth every 12 (twelve) hours as needed for moderate pain or severe pain.   vitamin B-12 1000 MCG tablet Commonly known as:  CYANOCOBALAMIN Take 1,000 mcg by mouth daily after breakfast.      Follow-up Information    [facility] up.   Why:  Call [facility] to arrange for hospital follow up and discuss constipation med changes Contact information: [place] [phone]         No Known Allergies  Consultations:  SLP   Procedures/Studies: Dg Chest 1 View  Result Date: 01/03/2019 CLINICAL DATA:  Shortness of breath and fever. EXAM: CHEST  1 VIEW COMPARISON:  Dec 29, 2018 FINDINGS: No pneumothorax. Stable cardiomediastinal silhouette. Bilateral pulmonary opacities, right greater than left, are stable. Stable right Port-A-Cath. IMPRESSION: Stable bilateral pulmonary opacities, right greater than left. Electronically Signed   By: Dorise Bullion III M.D   On: 01/03/2019 17:03   Dg Chest 2 View  Result Date: 01/11/2019 CLINICAL DATA:  Pleural effusion EXAM: CHEST - 2 VIEW COMPARISON:  01/03/2019 FINDINGS: Cardiac shadow is stable. Aortic calcifications are seen. Right chest wall port is noted in satisfactory position. Lungs are well aerated bilaterally with patchy infiltrates bilaterally relatively stable from the prior exam. No new focal abnormality is seen. Large right pleural effusion is noted. IMPRESSION: Stable bilateral infiltrates. Large right pleural effusion is noted. Electronically Signed   By: Inez Catalina M.D.   On: 01/11/2019 23:10   Dg Abd 1 View  Result Date: 01/03/2019 CLINICAL DATA:  Constipation. EXAM: ABDOMEN - 1 VIEW COMPARISON:  Swallowing study 12/31/2018 FINDINGS: There is barium throughout the colon particularly in the transverse colon. Barium is from the swallowing study 3 days earlier. Evidence for some stool in the rectal region. Evidence for colonic  diverticula. Nonobstructive bowel gas pattern. IMPRESSION: Large amount of barium within the colon related to the swallowing study 3 days earlier. Findings suggest slow transit of contrast through the bowel. However, no evidence for obstruction. Colonic diverticula. Electronically Signed   By: Markus Daft M.D.   On: 01/03/2019 17:04   Ct Head Wo Contrast  Result Date: 12/29/2018 CLINICAL DATA:  Altered level of consciousness. History of cervical cancer. EXAM: CT HEAD WITHOUT CONTRAST TECHNIQUE: Contiguous axial images were obtained from the base of the skull through the vertex without intravenous contrast. COMPARISON:  None. FINDINGS: Brain: Mild atrophy. Patchy white matter hypodensity bilaterally appears chronic. No acute infarct, hemorrhage, or mass lesion. Vascular: Atherosclerotic calcification in the carotid and vertebral arteries. Skull: Negative Sinuses/Orbits: Negative Other: None IMPRESSION: No acute abnormality. Mild atrophy and mild chronic microvascular ischemic type changes in the white matter. Electronically Signed   By: Franchot Gallo M.D.   On: 12/29/2018 16:37   Mr Jeri Cos YB Contrast  Result Date: 12/29/2018 CLINICAL DATA:  Ataxia EXAM: MRI HEAD WITHOUT AND WITH CONTRAST TECHNIQUE: Multiplanar, multiecho pulse sequences of the brain and surrounding structures were obtained without and with intravenous contrast. CONTRAST:  6 mL Gadavist COMPARISON:  Head CT 12/29/2018 FINDINGS: Examination is severely degraded by motion. BRAIN: Abnormal diffusion restriction along the cortex of the left anterior MCA territory. The midline structures are normal. No midline shift or other mass effect. Multifocal white matter hyperintensity, most commonly due to chronic ischemic microangiopathy. Generalized atrophy without lobar predilection. Susceptibility-sensitive sequences show no chronic microhemorrhage or superficial siderosis. No abnormal contrast enhancement. VASCULAR: The major intracranial arterial  and venous sinus flow voids are normal. SKULL AND UPPER CERVICAL SPINE: Calvarial bone marrow signal is normal. There is no skull base mass. Visualized upper cervical spine and soft tissues are normal. SINUSES/ORBITS: No fluid levels or advanced mucosal thickening. No mastoid or middle ear effusion. The orbits are normal. IMPRESSION: 1. 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 No hemorrhage or mass effect. 2. Chronic ischemic microvascular disease. 3. Severely motion degraded study. Electronically Signed   By: Ulyses Jarred M.D.   On: 12/29/2018 19:42   Ct Abdomen Pelvis W Contrast  Result Date: 01/11/2019 CLINICAL DATA:  Constipation EXAM: CT ABDOMEN AND PELVIS WITH CONTRAST TECHNIQUE: Multidetector CT imaging of the abdomen and pelvis was performed using the standard protocol following bolus administration of intravenous contrast. CONTRAST:  150mL OMNIPAQUE IOHEXOL 300 MG/ML  SOLN COMPARISON:  CT dated 07/23/2018. FINDINGS: Lower chest: There is an at least moderate sized partially visualized right-sided pleural effusion. There is a trace left-sided pleural effusion. Mitral valve calcifications are noted. There is interlobular septal thickening bilaterally. There are likely a few pulmonary nodules. Hepatobiliary: There is cholelithiasis without CT evidence of acute cholecystitis. The liver is unremarkable. Pancreas: Unremarkable. No pancreatic ductal dilatation or surrounding inflammatory changes. Spleen: Normal in size without focal abnormality. Adrenals/Urinary Tract: Adrenal glands are unremarkable. Kidneys are normal, without renal calculi, focal lesion, or hydronephrosis. Bladder is unremarkable. Stomach/Bowel: There is a large amount of stool throughout the colon. The appendix is unremarkable. There is no evidence of a small-bowel obstruction. Oral contrast is seen throughout the level of the colon. Vascular/Lymphatic: Diffuse atherosclerotic changes are noted of the  abdominal aorta. The left common iliac artery may be occluded proximally. There are enlarged retroperitoneal lymph nodes. There is enlarged partially visualized left axillary lymph node. Reproductive: There is a large complex calcified mass with pockets of gas. This likely represents the patient's known cervical cancer. The mass currently measures approximately 9.1 x 8.8 cm. Pulmonary mass previously measured approximately 11 x 8.6 cm. Other: CT abdomen there is diffuse body wall edema. There is a small bowel and fat containing umbilical hernia without evidence of an obstruction. Diffuse mesenteric edema is noted. Musculoskeletal: No acute or significant osseous findings. IMPRESSION: 1. Large amount of stool throughout the colon. Normal appendix in the right lower quadrant. No pneumatosis or free air. No bowel obstruction. 2. Large complex pelvic mass likely consistent with the patient's known cervical cancer. Pathologically enlarged inguinal and retroperitoneal lymph nodes are again noted. 3. Interval development of a moderate size right-sided pleural effusion. There is interlobular septal thickening consistent with volume overload. 4. Body wall and mesenteric edema. 5. Cholelithiasis without CT evidence of acute cholecystitis. Aortic Atherosclerosis (ICD10-I70.0). Electronically Signed   By: Constance Holster M.D.   On: 01/11/2019 22:17   Dg Chest Portable 1 View  Result Date: 12/29/2018 CLINICAL DATA:  Hypoxia, fever. EXAM: PORTABLE  CHEST 1 VIEW COMPARISON:  None. FINDINGS: The heart size and mediastinal contours are within normal limits. Atherosclerosis of thoracic aorta is noted. Right internal jugular Port-A-Cath is unchanged in position. No pneumothorax is noted. Mild bilateral perihilar and basilar opacities are noted concerning for edema or possibly inflammation. Small pleural effusions may be present. The visualized skeletal structures are unremarkable. IMPRESSION: Mild bilateral perihilar and basilar  opacities are noted concerning for edema or possibly inflammation. Small pleural effusions. Aortic Atherosclerosis (ICD10-I70.0). Electronically Signed   By: Marijo Conception M.D.   On: 12/29/2018 14:23   Dg Swallowing Func-speech Pathology  Result Date: 12/31/2018 Objective Swallowing Evaluation: Type of Study: MBS-Modified Barium Swallow Study  Patient Details Name: JERLEAN PERALTA MRN: [id number] Date of Birth: August 10, 1946 Today's Date: 12/31/2018 Time: SLP Start Time (ACUTE ONLY): 1300 -SLP Stop Time (ACUTE ONLY): 1420 SLP Time Calculation (min) (ACUTE ONLY): 80 min Past Medical History: Past Medical History: Diagnosis Date . Cancer (HCC)   cervical cancer . Diabetes mellitus without complication ([place])  . Hyperlipidemia  . Hypertension  . Knee pain, bilateral  . Neuropathy  . Stroke ([place])  . Underweight  Past Surgical History: Past Surgical History: Procedure Laterality Date . IR IMAGING GUIDED PORT INSERTION  11/05/2018 HPI: 73 year old female presented hospital with shortness of breath and inability to express herself.  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  She usually is able to talk and converse. There are no prior SLP notes in chart  No data recorded Assessment / Plan / Recommendation CHL IP CLINICAL IMPRESSIONS 12/31/2018 Clinical Impression Pt demonstrates a mild to moderate oral dysphagia due to missing dentition and right CN VII weakenss and sensory deficit. There is prolonged mastication with oral residual. There is intermittent anterior spillage with most bites and sips. Occasional premature spillage also occurs, but no penetration or aspiration was observed. Will initiate a dysphagia 1 (puree) and thin liquid diet With postential to advance solids if tolerated at bedside without too much  struggle or pocketing.  SLP Visit Diagnosis Dysphagia, oral phase (R13.11) Attention and concentration deficit following -- Frontal lobe and executive function deficit following -- Impact on safety and function --   CHL IP TREATMENT RECOMMENDATION 12/31/2018 Treatment Recommendations Defer until completion of intrumental exam   No flowsheet data found. CHL IP DIET RECOMMENDATION 12/31/2018 SLP Diet Recommendations Dysphagia 1 (Puree) solids;Thin liquid Liquid Administration via Cup;Straw Medication Administration Whole meds with puree Compensations Minimize environmental distractions;Monitor for anterior loss;Lingual sweep for clearance of pocketing;Follow solids with liquid Postural Changes Remain semi-upright after after feeds/meals (Comment)   CHL IP OTHER RECOMMENDATIONS 12/31/2018 Recommended Consults -- Oral Care Recommendations Oral care BID Other Recommendations --   CHL IP FOLLOW UP RECOMMENDATIONS 12/31/2018 Follow up Recommendations 24 hour supervision/assistance   CHL IP FREQUENCY AND DURATION 12/31/2018 Speech Therapy Frequency (ACUTE ONLY) min 2x/week Treatment Duration --      CHL IP ORAL PHASE 12/31/2018 Oral Phase Impaired Oral - Pudding Teaspoon -- Oral - Pudding Cup -- Oral - Honey Teaspoon -- Oral - Honey Cup -- Oral - Nectar Teaspoon -- Oral - Nectar Cup Right anterior bolus loss;Right pocketing in lateral sulci Oral - Nectar Straw Right anterior bolus loss Oral - Thin Teaspoon -- Oral - Thin Cup Right anterior bolus loss Oral -  Thin Straw Right anterior bolus loss Oral - Puree Right pocketing in lateral sulci Oral - Mech Soft Right pocketing in lateral sulci;Right anterior bolus loss Oral - Regular -- Oral - Multi-Consistency -- Oral - Pill -- Oral Phase - Comment --  CHL IP PHARYNGEAL PHASE 12/31/2018 Pharyngeal Phase WFL Pharyngeal- Pudding Teaspoon -- Pharyngeal -- Pharyngeal- Pudding Cup -- Pharyngeal -- Pharyngeal- Honey Teaspoon -- Pharyngeal -- Pharyngeal- Honey Cup -- Pharyngeal -- Pharyngeal-  Nectar Teaspoon -- Pharyngeal -- Pharyngeal- Nectar Cup -- Pharyngeal -- Pharyngeal- Nectar Straw -- Pharyngeal -- Pharyngeal- Thin Teaspoon -- Pharyngeal -- Pharyngeal- Thin Cup -- Pharyngeal -- Pharyngeal- Thin Straw -- Pharyngeal -- Pharyngeal- Puree -- Pharyngeal -- Pharyngeal- Mechanical Soft -- Pharyngeal -- Pharyngeal- Regular -- Pharyngeal -- Pharyngeal- Multi-consistency -- Pharyngeal -- Pharyngeal- Pill -- Pharyngeal -- Pharyngeal Comment --  No flowsheet data found. Herbie Baltimore, MA CCC-SLP Acute Rehabilitation Services Pager [phone] Office [phone] Lynann Beaver 12/31/2018, 2:08 PM              Vas US Carotid  Result Date: 12/30/2018 Carotid Arterial Duplex Study Indications: TIA. Performing Technologist: Oliver Hum RVT  Examination Guidelines: A complete evaluation includes B-mode imaging, spectral Doppler, color Doppler, and power Doppler as needed of all accessible portions of each vessel. Bilateral testing is considered an integral part of a complete examination. Limited examinations for reoccurring indications may be performed as noted.  Right Carotid Findings: +----------+--------+--------+--------+-----------------------+--------+           PSV cm/sEDV cm/sStenosisDescribe               Comments +----------+--------+--------+--------+-----------------------+--------+ CCA Prox  62      8               smooth and heterogenous         +----------+--------+--------+--------+-----------------------+--------+ CCA Distal68      16              smooth and heterogenous         +----------+--------+--------+--------+-----------------------+--------+ ICA Prox  122     47              smooth and heterogenoustortuous +----------+--------+--------+--------+-----------------------+--------+ ICA Distal62      21                                     tortuous +----------+--------+--------+--------+-----------------------+--------+ ECA       115      9                                               +----------+--------+--------+--------+-----------------------+--------+ +----------+--------+-------+--------+-------------------+           PSV cm/sEDV cmsDescribeArm Pressure (mmHG) +----------+--------+-------+--------+-------------------+ [id number]                                        +----------+--------+-------+--------+-------------------+ +---------+--------+--+--------+--+---------+ VertebralPSV cm/s60EDV cm/s17Antegrade +---------+--------+--+--------+--+---------+  Left Carotid Findings: +----------+--------+-------+--------+--------------------------------+--------+           PSV cm/sEDV    StenosisDescribe                        Comments  cm/s                                                    +----------+--------+-------+--------+--------------------------------+--------+ CCA Prox  80      13             smooth and heterogenous                  +----------+--------+-------+--------+--------------------------------+--------+ CCA Distal48      10             smooth and heterogenous                  +----------+--------+-------+--------+--------------------------------+--------+ ICA Prox  74      13             smooth, heterogenous and                                                  calcific                                 +----------+--------+-------+--------+--------------------------------+--------+ ICA Distal41      16                                             tortuous +----------+--------+-------+--------+--------------------------------+--------+ ECA       179     20                                                      +----------+--------+-------+--------+--------------------------------+--------+ +----------+--------+--------+--------+-------------------+ SubclavianPSV cm/sEDV cm/sDescribeArm Pressure (mmHG)  +----------+--------+--------+--------+-------------------+           158                                         +----------+--------+--------+--------+-------------------+ +---------+--------+--+--------+--+---------+ VertebralPSV cm/s60EDV cm/s22Antegrade +---------+--------+--+--------+--+---------+  Summary: Right Carotid: Velocities in the right ICA are consistent with a 40-59%                stenosis. Left Carotid: Velocities in the left ICA are consistent with a 1-39% stenosis. Vertebrals: Bilateral vertebral arteries demonstrate antegrade flow. *See table(s) above for measurements and observations.  Electronically signed by Antony Contras MD on 12/30/2018 at 12:25:45 PM.    Final    Vas [place] Transcranial Doppler  Result Date: 01/01/2019  Transcranial Doppler Indications: Stroke. Limitations for diagnostic windows: Unable to insonate right transtemporal window. Unable to insonate left transtemporal window. Performing Technologist: June Leap RDMS, RVT  Examination Guidelines: A complete evaluation includes B-mode imaging, spectral Doppler, color Doppler, and power Doppler as needed of all accessible portions of each vessel. Bilateral testing is considered an integral part of a complete examination. Limited examinations for reoccurring indications may be performed as noted.  +----------+-------------+----------+-----------+------------------------------+ RIGHT TCD Right VM (cm)Depth (cm)Pulsatility  Comment             +----------+-------------+----------+-----------+------------------------------+ Opthalmic     12.00                 1.52                                   +----------+-------------+----------+-----------+------------------------------+ ICA siphon   148.00                 1.43      difficult to obtain due to                                                      patient movement         +----------+-------------+----------+-----------+------------------------------+ Vertebral    -61.00                 1.61                                   +----------+-------------+----------+-----------+------------------------------+  +----------+------------+----------+-----------+-----------------------------+ LEFT TCD  Left VM (cm)Depth (cm)Pulsatility           Comment            +----------+------------+----------+-----------+-----------------------------+ Opthalmic    65.00                         Possible retrograde opthalmic +----------+------------+----------+-----------+-----------------------------+ ICA siphon   100.00                  1                                   +----------+------------+----------+-----------+-----------------------------+ Vertebral    -42.00                0.98                                  +----------+------------+----------+-----------+-----------------------------+  +------------+-------+-------+             VM cm/sComment +------------+-------+-------+ Prox Basilar-78.00         +------------+-------+-------+ Dist Basilar-60.00         +------------+-------+-------+ Summary:  Absent bitemporal windowslimits evaluation of anterior circulation.Mildly elevated bilateral carotid siphon,vertebrals and basilar artery mean flow velocities of unclear significance *See table(s) above for measurements and observations.  Diagnosing physician: Antony Contras MD Electronically signed by Antony Contras MD on 01/01/2019 at 8:10:49 AM.    Final        Subjective: Had BM overnight.  No vomiting.  Appears well this morning.  No fever, confusion, or respiratory distress per nuring.  Aphasia precludes history taking from patient.  Discharge Exam: Vitals:   01/12/19 2123 01/13/19 0520  BP: (!) 127/51 (!) 137/51  Pulse: 91 94  Resp: 16 20  Temp: 98.6 F (37 C) 98.2 F (36.8 C)  SpO2: 98% 96%   Vitals:   01/12/19 1251 01/12/19 2122  01/12/19 2123 01/13/19 0520  BP: (!) 147/55 (!) 127/51 (!) 127/51 (!) 137/51  Pulse: 83  91 94  Resp: 16  16  20  Temp: 97.8 F (36.6 C)  98.6 F (37 C) 98.2 F (36.8 C)  TempSrc: Oral  Oral Oral  SpO2: 97%  98% 96%  Weight:    65.9 kg  Height:        General: Pt is alert, awake, not in acute distress, sitting up in bed, pleasant Cardiovascular: RRR, nl S1-S2, Sys murmur noted.   No LE edema.   Respiratory: Normal respiratory rate and rhythm.  CTAB without rales or wheezes. Abdominal: Abdomen soft and non-tender.  No distension or HSM.   Neuro/Psych: Right sided hemiparesis.  Judgment and insight appear impaired by aphasia.   The results of significant diagnostics from this hospitalization (including imaging, microbiology, ancillary and laboratory) are listed below for reference.     Microbiology: Recent Results (from the past 240 hour(s))  Blood Culture (routine x 2)     Status: None (Preliminary result)   Collection Time: 01/11/19  8:29 PM  Result Value Ref Range Status   Specimen Description   Final    BLOOD LEFT ANTECUBITAL Performed at [facility] [address], [place], [place] [address]    Special Requests   Final    BOTTLES DRAWN AEROBIC AND ANAEROBIC Blood Culture results may not be optimal due to an inadequate volume of blood received in culture bottles Performed at [facility] [address] [place], [place] [address]    Culture   Final    NO GROWTH 2 DAYS Performed at [facility] [address], [place], [place] [address]    Report Status PENDING  Incomplete  Blood Culture (routine x 2)     Status: None (Preliminary result)   Collection Time: 01/11/19  8:34 PM  Result Value Ref Range Status   Specimen Description   Final    BLOOD RIGHT HAND Performed at [facility] [address], [place], [place] [address]    Special Requests   Final    BOTTLES DRAWN AEROBIC AND ANAEROBIC Blood  Culture results may not be optimal due to an inadequate volume of blood received in culture bottles Performed at [facility] [address], [place], [place] [address]    Culture   Final    NO GROWTH 2 DAYS Performed at [facility] [address] [place], [place] [address]    Report Status PENDING  Incomplete  SARS Coronavirus 2 (CEPHEID - Performed in [facility] hospital lab), Hosp Order     Status: None   Collection Time: 01/11/19 11:03 PM  Result Value Ref Range Status   SARS Coronavirus 2 NEGATIVE NEGATIVE Final    Comment: (NOTE) If result is NEGATIVE SARS-CoV-2 target nucleic acids are NOT DETECTED. The SARS-CoV-2 RNA is generally detectable in upper and lower  respiratory specimens during the acute phase of infection. The lowest  concentration of SARS-CoV-2 viral copies this assay can detect is 250  copies / mL. A negative result does not preclude SARS-CoV-2 infection  and should not be used as the sole basis for treatment or other  patient management decisions.  A negative result may occur with  improper specimen collection / handling, submission of specimen other  than nasopharyngeal swab, presence of viral mutation(s) within the  areas targeted by this assay, and inadequate number of viral copies  (<250 copies / mL). A negative result must be combined with clinical  observations, patient history, and epidemiological information. If result is POSITIVE SARS-CoV-2 target nucleic  acids are DETECTED. The SARS-CoV-2 RNA is generally detectable in upper and lower  respiratory specimens dur ing the acute phase of infection.  Positive  results are indicative of active infection with SARS-CoV-2.  Clinical  correlation with patient history and other diagnostic information is  necessary to determine patient infection status.  Positive results do  not rule out bacterial infection or co-infection with other viruses. If result is PRESUMPTIVE  POSTIVE SARS-CoV-2 nucleic acids MAY BE PRESENT.   A presumptive positive result was obtained on the submitted specimen  and confirmed on repeat testing.  While 2019 novel coronavirus  (SARS-CoV-2) nucleic acids may be present in the submitted sample  additional confirmatory testing may be necessary for epidemiological  and / or clinical management purposes  to differentiate between  SARS-CoV-2 and other Sarbecovirus currently known to infect humans.  If clinically indicated additional testing with an alternate test  methodology [phone]) is advised. The SARS-CoV-2 RNA is generally  detectable in upper and lower respiratory sp ecimens during the acute  phase of infection. The expected result is Negative. Fact Sheet for Patients:  StrictlyIdeas.no Fact Sheet for Healthcare Providers: BankingDealers.co.za This test is not yet approved or cleared by the [place] FDA and has been authorized for detection and/or diagnosis of SARS-CoV-2 by FDA under an Emergency Use Authorization (EUA).  This EUA will remain in effect (meaning this test can be used) for the duration of the COVID-19 declaration under Section 564(b)(1) of the Act, 21 U.S.C. section 360bbb-3(b)(1), unless the authorization is terminated or revoked sooner. Performed at [facility], [place] [address], [place], [place] [address]      Labs: BNP (last 3 results) No results for input(s): BNP in the last 8760 hours. Basic Metabolic Panel: Recent Labs  Lab 01/11/19 2029 01/12/19 0530  NA 121* 122*  K 3.8 4.4  CL 87* 89*  CO2 24 20*  GLUCOSE 139* 122*  BUN 10 8  CREATININE 0.34* <0.30*  CALCIUM 8.4* 8.2*   Liver Function Tests: Recent Labs  Lab 01/11/19 2029  AST 23  ALT 13  ALKPHOS 72  BILITOT 1.0  PROT 7.0  ALBUMIN 2.3*   No results for input(s): LIPASE, AMYLASE in the last 168 hours. No results for input(s): AMMONIA in the last 168  hours. CBC: Recent Labs  Lab 01/11/19 2029 01/12/19 0530  WBC 13.6* 13.0*  NEUTROABS 10.8*  --   HGB 7.8* 8.9*  HCT 23.3* 26.6*  MCV 93.6 91.7  PLT 349 185   Cardiac Enzymes: No results for input(s): CKTOTAL, CKMB, CKMBINDEX, TROPONINI in the last 168 hours. BNP: Invalid input(s): POCBNP CBG: Recent Labs  Lab 01/06/19 1701  GLUCAP 198*   D-Dimer Recent Labs    01/11/19 2359  DDIMER 2.92*   Hgb A1c No results for input(s): HGBA1C in the last 72 hours. Lipid Profile Recent Labs    01/11/19 2359  TRIG 53   Thyroid function studies No results for input(s): TSH, T4TOTAL, T3FREE, THYROIDAB in the last 72 hours.  Invalid input(s): FREET3 Anemia work up Recent Labs    01/11/19 2359  FERRITIN 366*   Urinalysis    Component Value Date/Time   COLORURINE YELLOW 01/11/2019 2029   APPEARANCEUR CLEAR 01/11/2019 2029   LABSPEC 1.031 (H) 01/11/2019 2029   PHURINE 7.0 01/11/2019 2029   Juab 01/11/2019 2029   HGBUR NEGATIVE 01/11/2019 2029   BILIRUBINUR NEGATIVE 01/11/2019 2029   KETONESUR 5 (A) 01/11/2019 2029   PROTEINUR NEGATIVE 01/11/2019 2029  NITRITE NEGATIVE 01/11/2019 2029   LEUKOCYTESUR SMALL (A) 01/11/2019 2029   Sepsis Labs Invalid input(s): PROCALCITONIN,  WBC,  LACTICIDVEN Microbiology Recent Results (from the past 240 hour(s))  Blood Culture (routine x 2)     Status: None (Preliminary result)   Collection Time: 01/11/19  8:29 PM  Result Value Ref Range Status   Specimen Description   Final    BLOOD LEFT ANTECUBITAL Performed at [facility] [address] [place], [place] [address]    Special Requests   Final    BOTTLES DRAWN AEROBIC AND ANAEROBIC Blood Culture results may not be optimal due to an inadequate volume of blood received in culture bottles Performed at [facility] [address] [place], [place] [address]    Culture   Final    NO GROWTH 2 DAYS Performed at [facility] [address], [place], [place] [address]    Report Status PENDING  Incomplete  Blood Culture (routine x 2)     Status: None (Preliminary result)   Collection Time: 01/11/19  8:34 PM  Result Value Ref Range Status   Specimen Description   Final    BLOOD RIGHT HAND Performed at [facility] [address] [place], [place] [address]    Special Requests   Final    BOTTLES DRAWN AEROBIC AND ANAEROBIC Blood Culture results may not be optimal due to an inadequate volume of blood received in culture bottles Performed at [facility] [address], [place], [place] [address]    Culture   Final    NO GROWTH 2 DAYS Performed at [facility] [address] [place], [place] [address]    Report Status PENDING  Incomplete  SARS Coronavirus 2 (CEPHEID - Performed in [facility] hospital lab), Hosp Order     Status: None   Collection Time: 01/11/19 11:03 PM  Result Value Ref Range Status   SARS Coronavirus 2 NEGATIVE NEGATIVE Final    Comment: (NOTE) If result is NEGATIVE SARS-CoV-2 target nucleic acids are NOT DETECTED. The SARS-CoV-2 RNA is generally detectable in upper and lower  respiratory specimens during the acute phase of infection. The lowest  concentration of SARS-CoV-2 viral copies this assay can detect is 250  copies / mL. A negative result does not preclude SARS-CoV-2 infection  and should not be used as the sole basis for treatment or other  patient management decisions.  A negative result may occur with  improper specimen collection / handling, submission of specimen other  than nasopharyngeal swab, presence of viral mutation(s) within the  areas targeted by this assay, and inadequate number of viral copies  (<250 copies / mL). A negative result must be combined with clinical  observations, patient history, and epidemiological information. If result is POSITIVE SARS-CoV-2 target nucleic acids are DETECTED. The SARS-CoV-2 RNA is  generally detectable in upper and lower  respiratory specimens dur ing the acute phase of infection.  Positive  results are indicative of active infection with SARS-CoV-2.  Clinical  correlation with patient history and other diagnostic information is  necessary to determine patient infection status.  Positive results do  not rule out bacterial infection or co-infection with other viruses. If result is PRESUMPTIVE POSTIVE SARS-CoV-2 nucleic acids MAY BE PRESENT.   A presumptive positive result was obtained on the submitted specimen  and confirmed on repeat testing.  While 2019 novel coronavirus  (SARS-CoV-2) nucleic acids may be present in  the submitted sample  additional confirmatory testing may be necessary for epidemiological  and / or clinical management purposes  to differentiate between  SARS-CoV-2 and other Sarbecovirus currently known to infect humans.  If clinically indicated additional testing with an alternate test  methodology [phone]) is advised. The SARS-CoV-2 RNA is generally  detectable in upper and lower respiratory sp ecimens during the acute  phase of infection. The expected result is Negative. Fact Sheet for Patients:  StrictlyIdeas.no Fact Sheet for Healthcare Providers: BankingDealers.co.za This test is not yet approved or cleared by the [place] FDA and has been authorized for detection and/or diagnosis of SARS-CoV-2 by FDA under an Emergency Use Authorization (EUA).  This EUA will remain in effect (meaning this test can be used) for the duration of the COVID-19 declaration under Section 564(b)(1) of the Act, 21 U.S.C. section 360bbb-3(b)(1), unless the authorization is terminated or revoked sooner. Performed at [facility], [place] [address], [place], [place] [address]      Time coordinating discharge: 25 minutes      SIGNED:   Edwin Dada, MD  [facility] 01/13/2019, 11:35 AM

## 2019-01-13 NOTE — Telephone Encounter (Signed)
I called and lvm for Jen at [facility] to let her know that Dr. Rogers Blocker is out of the office today and that someone from our office would contact her tomorrow.

## 2019-01-13 NOTE — Progress Notes (Signed)
COMMUNITY PALLIATIVE CARE SW NOTE  PATIENT NAME: TYQUISHA SHARPS DOB: 02-Aug-1946 MRN: [id number]  PRIMARY CARE PROVIDER: [name], MD  RESPONSIBLE PARTY:  Acct ID - Guarantor Home Phone Work Phone Relationship Acct Type  [id number] Madelin [phone]  Self P/F     [place], [place], [place] [address]   Due to the COVID-19 crisis, this virtual check-in visit was done via telephone from my office and it was initiated and consent given by this patient and or family.   PLAN OF CARE and INTERVENTIONS:             1. GOALS OF CARE/ ADVANCE CARE PLANNING:  Goal is for patient to remain at home.  Patient's niece wants her to be as active as possible.  Niece stated patient is a full code. 2. SOCIAL/EMOTIONAL/SPIRITUAL ASSESSMENT/ INTERVENTIONS:  SW contacted patient's niece, [name], per the request of Palliative Care NP, Amy Olena Heckle.  SW provided active listening and supportive counseling.  Emailed [name] the website for [employer] and in-home aide services.  They do not wish to explore the PACE program at this time.  [name] was on her way to [facility] for her sister-in-law's funeral who died from cancer. 3. PATIENT/CAREGIVER EDUCATION/ COPING:  Patient's niece copes by expressing her feelings openly. 4. PERSONAL EMERGENCY PLAN:  Family will contact MD as necessary. 5. COMMUNITY RESOURCES COORDINATION/ HEALTH CARE NAVIGATION:  Patient is receiving PT/OT. 6. FINANCIAL/LEGAL CONCERNS/INTERVENTIONS:  None identified by family.     SOCIAL HX:  Social History   Tobacco Use  . Smoking status: Former Smoker    Packs/day: 0.50    Types: Cigarettes  . Smokeless tobacco: Never Used  . Tobacco comment: last smoke week 3/10  Substance Use Topics  . Alcohol use: Never    Frequency: Never    CODE STATUS:  Full Code  ADVANCED DIRECTIVES: N MOST FORM COMPLETE:  N HOSPICE EDUCATION PROVIDED: N PPS:  Family reports patient's appetite is normal.  She cannot stand. Duration of visit and  documentation:  45 minutes.      Creola Corn Keera Altidor, LCSW

## 2019-01-13 NOTE — Telephone Encounter (Signed)
Copied from [place] [phone]. Topic: General - Other >> Jan 13, 2019 11:19 AM Yvette Rack wrote: Reason for CRM: Delsa Sale with [facility] called in to see if Dr. Rogers Blocker would be attending of record for the patient. Per Delsa Sale patient will be discharged this afternoon. Cb# [phone]

## 2019-01-13 NOTE — Plan of Care (Signed)
Patient discharging home with family and Hospice today. Family updates given.

## 2019-01-14 NOTE — Telephone Encounter (Signed)
Delsa Sale with [facility] called again asking for Dr Rogers Blocker permission to go see the patient. They have an appointment with the patient this morning at 9.30 a m. Please call Delsa Sale [phone]

## 2019-01-14 NOTE — Telephone Encounter (Signed)
Called [place] and let her know I will be attending of records. Gave order.  [name], MD [place]

## 2019-01-15 ENCOUNTER — Other Ambulatory Visit: Payer: Self-pay | Admitting: Family Medicine

## 2019-01-15 ENCOUNTER — Telehealth: Payer: Self-pay | Admitting: Family Medicine

## 2019-01-15 MED ORDER — TRAMADOL HCL 50 MG PO TABS: 50.0000 mg | ORAL_TABLET | Freq: Four times a day (QID) | ORAL | 0 refills | Status: AC | PRN

## 2019-01-15 NOTE — Telephone Encounter (Signed)
See note

## 2019-01-15 NOTE — Telephone Encounter (Signed)
Copied from [place]. Topic: Quick Communication - Rx Refill/Question >> Jan 15, 2019 10:43 AM Selinda Flavin B, NT wrote: Medication: traMADol (ULTRAM) 50 MG tablet  Has the patient contacted their pharmacy? yes (Agent: If no, request that the patient contact the pharmacy for the refill.) (Agent: If yes, when and what did the pharmacy advise?)  Preferred Pharmacy (with phone number or street name): [id number] [place]: Please be advised that RX refills may take up to 3 business days. We ask that you follow-up with your pharmacy.

## 2019-01-15 NOTE — Telephone Encounter (Signed)
Spoke w/Katie Myers (hospice nurse from [facility]) and advised that Dr. Rogers Blocker is fine with sending in rx for Tramadol but we need to confirm that she has completely stopped taking Dilaudid and Oxycodone and that all other narcotics have been d/c.  Based on her recent hospitalization on 5/24, it appears that her previous narcotic pain medications have indeed been d/c but we need confirmation of this before Dr. Rogers Blocker can prescribe Tramadol.  [name] verbalized understanding and will confirm this info and contact me back.

## 2019-01-15 NOTE — Telephone Encounter (Signed)
Copied from [place] [id number]. Topic: Quick Communication - See Telephone Encounter >> Jan 15, 2019 10:46 AM Rutherford Nail, NT wrote: CRM for notification. See Telephone encounter for: 01/15/19. [name] with [facility] calling and states that the granddaughter was wanting to know if Dr Rogers Blocker would be okay with the patient increasing her Metformin back up to 2 tablets a day? States that the patient's blood sugar has been in the 200s since decreasing medication to 1 tablet per day. Please advise CB#: [phone]

## 2019-01-15 NOTE — Telephone Encounter (Unsigned)
Copied from [place] [id number]. Topic: General - Other >> Jan 15, 2019  2:05 PM Carolyn Stare wrote: [name] call to say pt is not taking any other narc than tramadol  this is for [facility]

## 2019-01-15 NOTE — Telephone Encounter (Signed)
Spoke to [facility] w/[facility] and advised per Dr. Rogers Blocker that Metformin can be increased back up to 2 pills per day but if not on insulin does not need to be checking blood sugars all of the time.  At this point, tight diabetic control is not our goal.  If patient is not eating or drinking our urine output decreases, the metformin can hurt her.    Advised [name] also that if patient is eating, drinking and has normal output that Dr. Rogers Blocker is okay w/patient taking, but would prefer that we hold if patient declines due to possible acute renal issues.    [name] verbalized understanding.

## 2019-01-15 NOTE — Progress Notes (Signed)
pmpwebsite checked. Has not filled other narcotics (dilaudid) since 5/6. Tramadol given on 5/19 for only 6 pills. Nurse called from hospice and verified she is on no other narcotics. Tramadol refill sent in for q 6 hours. Quantity 90.  [name], MD [place]

## 2019-01-15 NOTE — Telephone Encounter (Signed)
Copied from [place] [phone]. Topic: General - Other >> Jan 15, 2019  2:05 PM Carolyn Stare wrote: Katie Myers call to say pt is not taking any other narc than tramadol  this is for [facility]

## 2019-01-15 NOTE — Telephone Encounter (Signed)
Refilling her tramadol for q 6hours as requested.  [name], MD [place]

## 2019-01-15 NOTE — Telephone Encounter (Signed)
I will fill tramadol, but need to make sure off all other pain meds that she was on by oncology. Was on dilaudid and other big narcotics and if so does not need to be on tramadol. Please call and clarify.

## 2019-01-15 NOTE — Telephone Encounter (Signed)
Let them know they can increase this back up to 2 pills/day, but again, if not on insulin doesn't need to be checking blood sugars all of the time. At this point, tight diabetic control is not our goal. Also, if not eating or drinking or urine output decreases, the metformin can hurt her.   Can we check with hospice on this as well... if eating, drinking with normal urine output im okay with her taking, but would prefer we hold if patient declines due to possible acute renal issues.

## 2019-01-16 ENCOUNTER — Telehealth: Payer: Self-pay | Admitting: Family Medicine

## 2019-01-16 LAB — CULTURE, BLOOD (ROUTINE X 2)
Culture: NO GROWTH
Culture: NO GROWTH

## 2019-01-16 NOTE — Telephone Encounter (Signed)
See note

## 2019-01-16 NOTE — Telephone Encounter (Signed)
Copied from [place] [phone]. Topic: Medical Record Request - Provider/Facility Request >> Jan 16, 2019  3:31 PM Erick Blinks wrote: PT is having increased pain  Was taking tramadol Not working  Old prescription dilloted 2 MG every 6 hours from cancer center, needed to check with PCP to see if she okay with Pt taking this medicine  Dilaudid  [phone] Patient Name/DOB/MRN #: Katie Myers   05/03/2020   [id number] Requestor Name/Agency: Judeen Hammans from [facility] Call Back #: [phone] Information Requested: Pt is having increased pain, was taking tramadol however it is not "working." Pt was taking Dilaudid 2 MG every 6 hours from [employer]. Judeen Hammans called to see if PCP approves of this medicine  Route to [employer] for [employer]. For all other clinics, route to the clinic's PEC Pool.

## 2019-01-16 NOTE — Telephone Encounter (Signed)
Spoke to [place] from [employer] and reviewed notes/recommendations per Dr. Rogers Blocker.  Judeen Hammans verbalized understanding.

## 2019-01-16 NOTE — Telephone Encounter (Signed)
She will need to stop tramadol and can start back dilaudid if they still have this from cancer doctor. If needs to continue this, will need to get order for this from hospice doc. Will not px this further.  [name], MD [place]

## 2019-01-19 ENCOUNTER — Telehealth: Payer: Self-pay | Admitting: Family Medicine

## 2019-01-19 NOTE — Telephone Encounter (Signed)
Spoke with Katie Myers and clarified that Dr. Rogers Blocker will not be prescribing Dilaudid or any narcotic pain management for patient and that this needs to go through hospice physician.  Katie Myers verbalizes understanding and states that there was a miscommunication from on call staff through the weekend that this information did not get relayed properly on their part.

## 2019-01-19 NOTE — Telephone Encounter (Signed)
Therapist, music at [place] Nonclinical Telephone Record [place] at [place] Client Site [place] [facility] [place] Type Call Who Is Calling Physician / Provider / Hospital Call Type Provider Call [facility] Page Now Reason for Call Request to speak to Physician Initial Comment Caller wants to speak to doctor to change the frequency of a pain medication. Caller missed the first call. Additional Comment Patient Name Katie Myers Patient DOB 1945/11/28 Requesting Provider Jen Mow Physician Number [phone] Facility Name [facility] Comments User: Ivar Drape Date/Time Eilene Ghazi Time): 01/18/2019 1:01:52 PM Dr. Birdie Riddle called back in, connected to [place]. Paging DoctorName Phone DateTime Result/Outcome Message Type Notes Dimple Nanas- MD [phone] 01/18/2019 12:57:46 PM Paged On Call Back to Call Center Doctor Paged Please call Amy w/ Access Nurse @ [phone]. Thanks! Dimple Nanas- MD 01/18/2019 1:01:54 PM Spoke with On Call - General Message Result Call Closed By: Ivar Drape Transaction Date/Time: 01/18/2019 12:34:22 PM (ET)

## 2019-01-19 NOTE — Telephone Encounter (Signed)
Caller name: [name]  Relation to pt: [facility]   Call back number: [phone]   Reason for call:  Returning call (practice line was busy after a few attempts) please advise

## 2019-01-19 NOTE — Telephone Encounter (Signed)
Late Entry- Hospice RN called this weekend indicating that pt's Dilaudid was wearing off before the 6 hr dosing window and that family was having to give it at least Q5.  RN reports 'considerable pain' and is asking to change dosing schedule.  Will increase to Q4 from Q6 but indicated medication would need to be obtained/adjusted through Hospice or PCP going forward.

## 2019-01-19 NOTE — Telephone Encounter (Signed)
Left detailed voicemail message on cell phone for Lovey Newcomer (hospice nurse from [facility]) requesting a call back to discuss medications for patient.

## 2019-01-19 NOTE — Telephone Encounter (Signed)
Agree with Dr. Birdie Riddle...  I discussed on our last phone message that hopsice doctor needs to adjust/px the dialuadid. Please let them know this again.   [name], MD [place]

## 2019-01-23 ENCOUNTER — Telehealth: Payer: Self-pay

## 2019-01-23 NOTE — Telephone Encounter (Signed)
Incoming call from Katie Myers, Publishing copy for [employer].  Patient passed away on 2019-01-28.  Discussed at great length/detail that we were not notified that patient passed and also Marzetta Board reported that patient's name was also misspelled in their system and they also have another patient that has the exact same name.  I discussed with her that there have been quite a few instances where the lines of communication have broken down and critical information that should have been was never communicated to [place] at all or was miscommunicated.  Stacy agreed with me that this was a major disconnect in the lines of communication and that she was going to do some investigating and digging.  I provided her with several specific examples from patient's chart regarding this.  She also stated that these were great examples for improvement for the staff who were responsible for making these errors.

## 2019-01-23 NOTE — Telephone Encounter (Signed)
Called hospice and got time of death. Death certificate filled out.  [name], MD [place]

## 2019-01-30 ENCOUNTER — Other Ambulatory Visit: Payer: Self-pay | Admitting: Hematology and Oncology

## 2019-02-18 DEATH — deceased

## 2019-03-13 ENCOUNTER — Encounter: Payer: Self-pay | Admitting: Hematology and Oncology

## 2019-03-13 NOTE — Progress Notes (Signed)
Received call from patient niece and Everlene Balls) requesting medication list from last visit to submit to insurance company.  Pulled from last office visit and sent via email per her request.

## 2019-03-26 ENCOUNTER — Telehealth: Payer: Self-pay

## 2019-03-26 NOTE — Telephone Encounter (Signed)
Spoke to [facility] at [facility].  We received ppw to be completed for home health for the period from 4/24-6/22/20.  Othelia Pulling that Dr. Rogers Blocker is on maternity leave currently but will return on 8/10.  Lelan Pons stated that it would be fine to wait for her return for ppw to be completed.

## 2019-07-10 IMAGING — CT CT ABDOMEN AND PELVIS WITH CONTRAST
2 of 5 series · 15 of 46 positions shown, 17 images · IV contrast (ISOVUE)
Comparison: CT dated 07/23/2018.

CLINICAL DATA: Constipation

EXAM:
CT ABDOMEN AND PELVIS WITH CONTRAST
TECHNIQUE: Multidetector CT imaging of the abdomen and pelvis was performed
using the standard protocol following bolus administration of
intravenous contrast.
CONTRAST:  100mL OMNIPAQUE IOHEXOL 300 MG/ML  SOLN

[Series 2: axial st · axial · 0.92mm/px · z∈[+1096,+1446]mm · 12 of 82 slices shown, 14 images]
[im 6/82  soft-tissue]
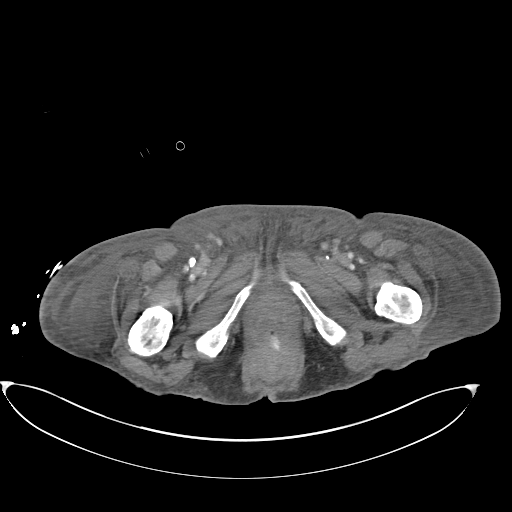
[im 6/82  bone]
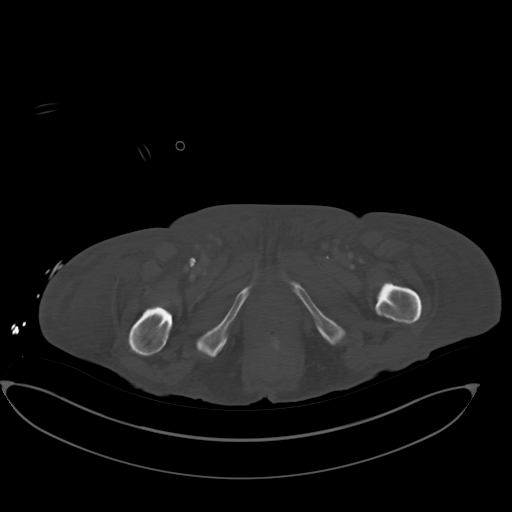
[im 11/82  soft-tissue]
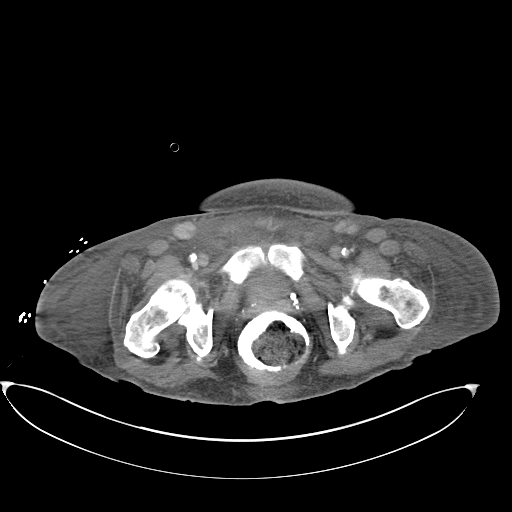
[im 21/82  soft-tissue]
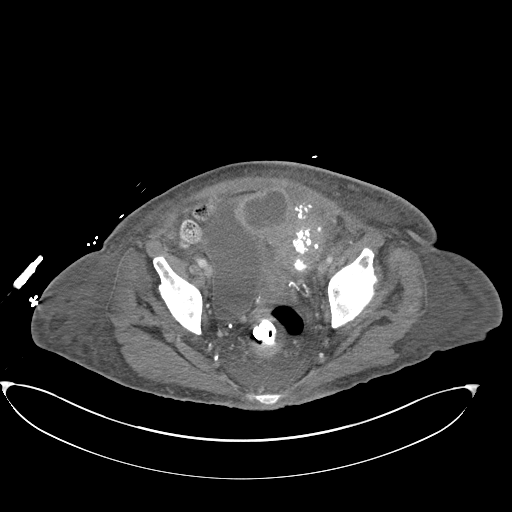
[im 26/82  soft-tissue]
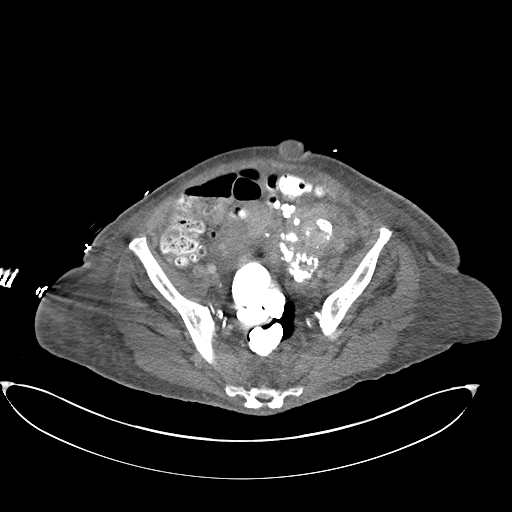
[im 31/82  soft-tissue]
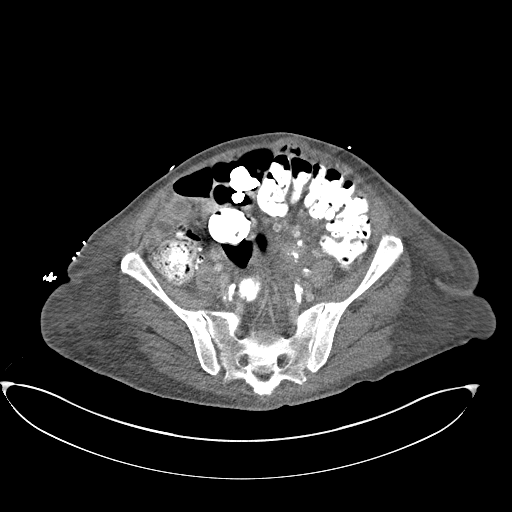
[im 36/82  soft-tissue]
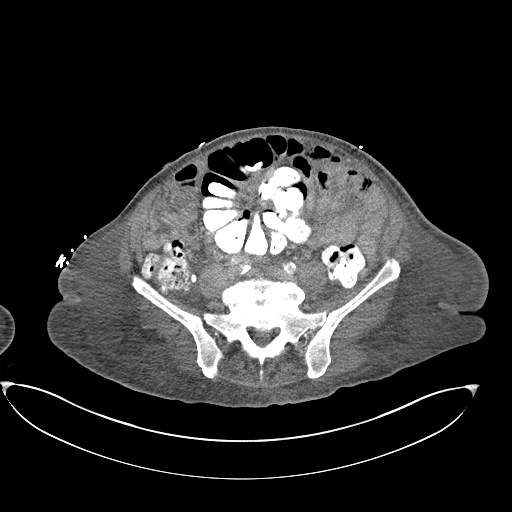
[im 46/82  soft-tissue]
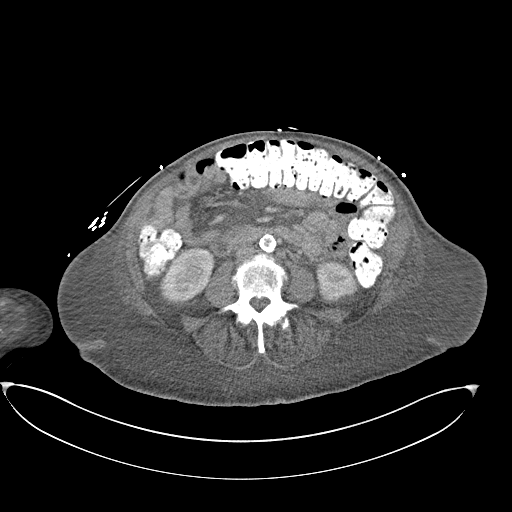
[im 51/82  soft-tissue]
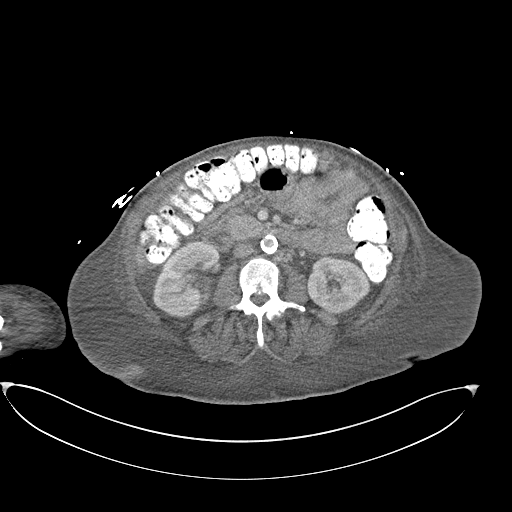
[im 56/82  soft-tissue]
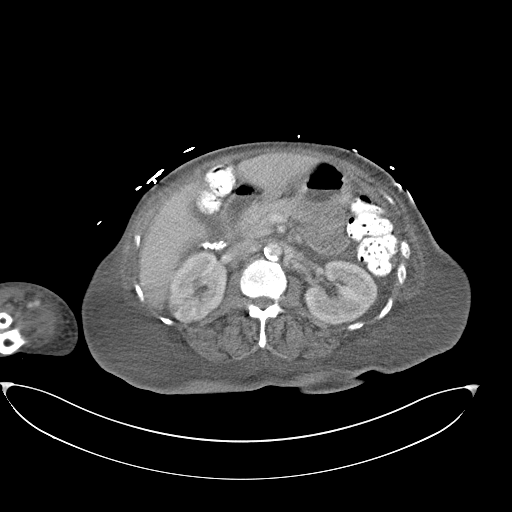
[im 56/82  bone]
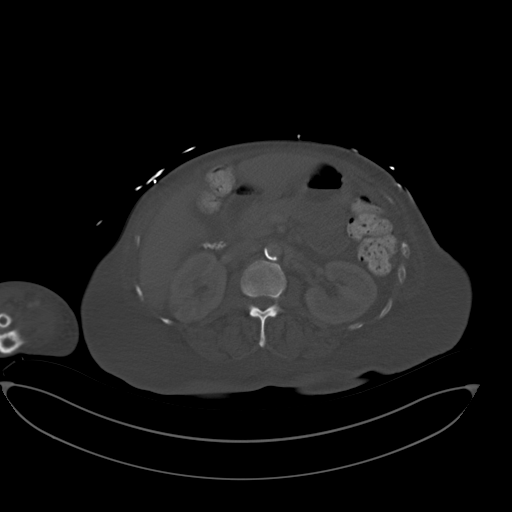
[im 61/82  soft-tissue]
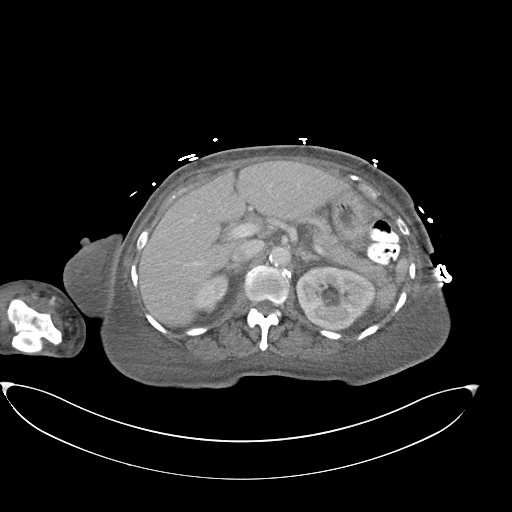
[im 71/82  soft-tissue]
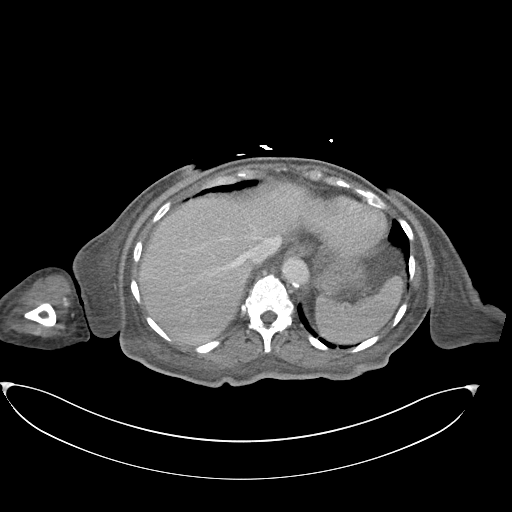
[im 76/82  soft-tissue]
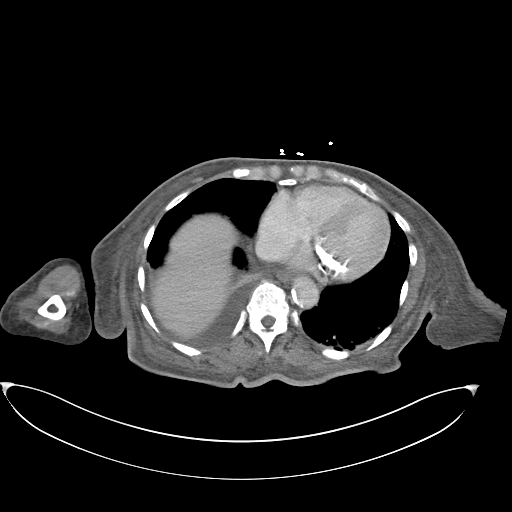

[Series 4: coronal st · coronal · 0.78mm/px · 3 of 147 slices shown]
[im 49/147  soft-tissue]
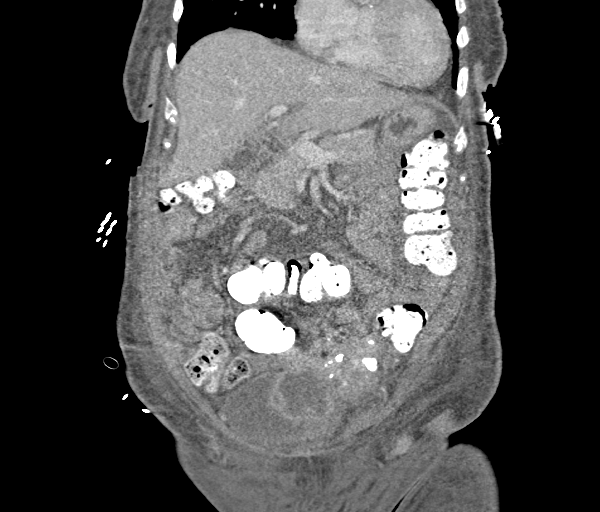
[im 65/147  soft-tissue]
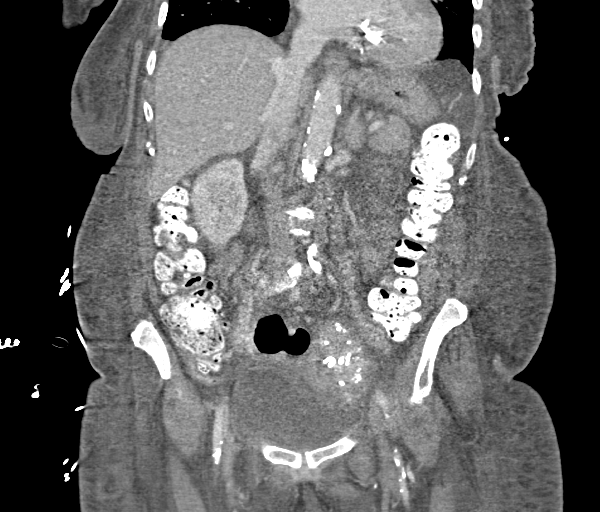
[im 82/147  soft-tissue]
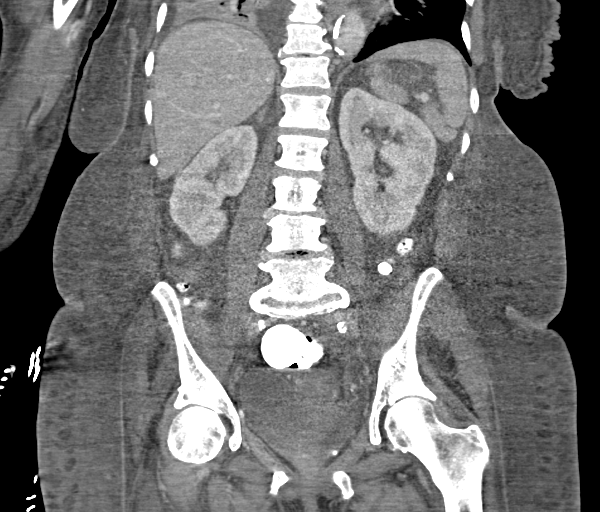

[15 of 46 positions shown; findings below may reference images not displayed]

FINDINGS: Lower chest: There is an at least moderate sized partially
visualized right-sided pleural effusion. There is a trace left-sided
pleural effusion. Mitral valve calcifications are noted. There is
interlobular septal thickening bilaterally. There are likely a few
pulmonary nodules.

Hepatobiliary: There is cholelithiasis without CT evidence of acute
cholecystitis. The liver is unremarkable.

Pancreas: Unremarkable. No pancreatic ductal dilatation or
surrounding inflammatory changes.

Spleen: Normal in size without focal abnormality.

Adrenals/Urinary Tract: Adrenal glands are unremarkable. Kidneys are
normal, without renal calculi, focal lesion, or hydronephrosis.
Bladder is unremarkable.

Stomach/Bowel: There is a large amount of stool throughout the
colon. The appendix is unremarkable. There is no evidence of a
small-bowel obstruction. Oral contrast is seen throughout the level
of the colon.

Vascular/Lymphatic: Diffuse atherosclerotic changes are noted of the
abdominal aorta. The left common iliac artery may be occluded
proximally. There are enlarged retroperitoneal lymph nodes. There is
enlarged partially visualized left axillary lymph node.

Reproductive: There is a large complex calcified mass with pockets
of gas. This likely represents the patient's known cervical cancer.
The mass currently measures approximately 9.1 x 8.8 cm. Pulmonary
mass previously measured approximately 11 x 8.6 cm.

Other: CT abdomen there is diffuse body wall edema. There is a small
bowel and fat containing umbilical hernia without evidence of an
obstruction. Diffuse mesenteric edema is noted.

Musculoskeletal: No acute or significant osseous findings.
IMPRESSION: 1. Large amount of stool throughout the colon. Normal appendix in
the right lower quadrant. No pneumatosis or free air. No bowel
obstruction.
2. Large complex pelvic mass likely consistent with the patient's
known cervical cancer. Pathologically enlarged inguinal and
retroperitoneal lymph nodes are again noted.
3. Interval development of a moderate size right-sided pleural
effusion. There is interlobular septal thickening consistent with
volume overload.
4. Body wall and mesenteric edema.
5. Cholelithiasis without CT evidence of acute cholecystitis.

Aortic Atherosclerosis ([id number]).

## 2019-07-10 IMAGING — CR CHEST - 2 VIEW
2 series · 2 of 2 positions shown · non-contrast
Comparison: 01/03/2019

CLINICAL DATA: Pleural effusion

EXAM:
CHEST - 2 VIEW

[w chest lat]
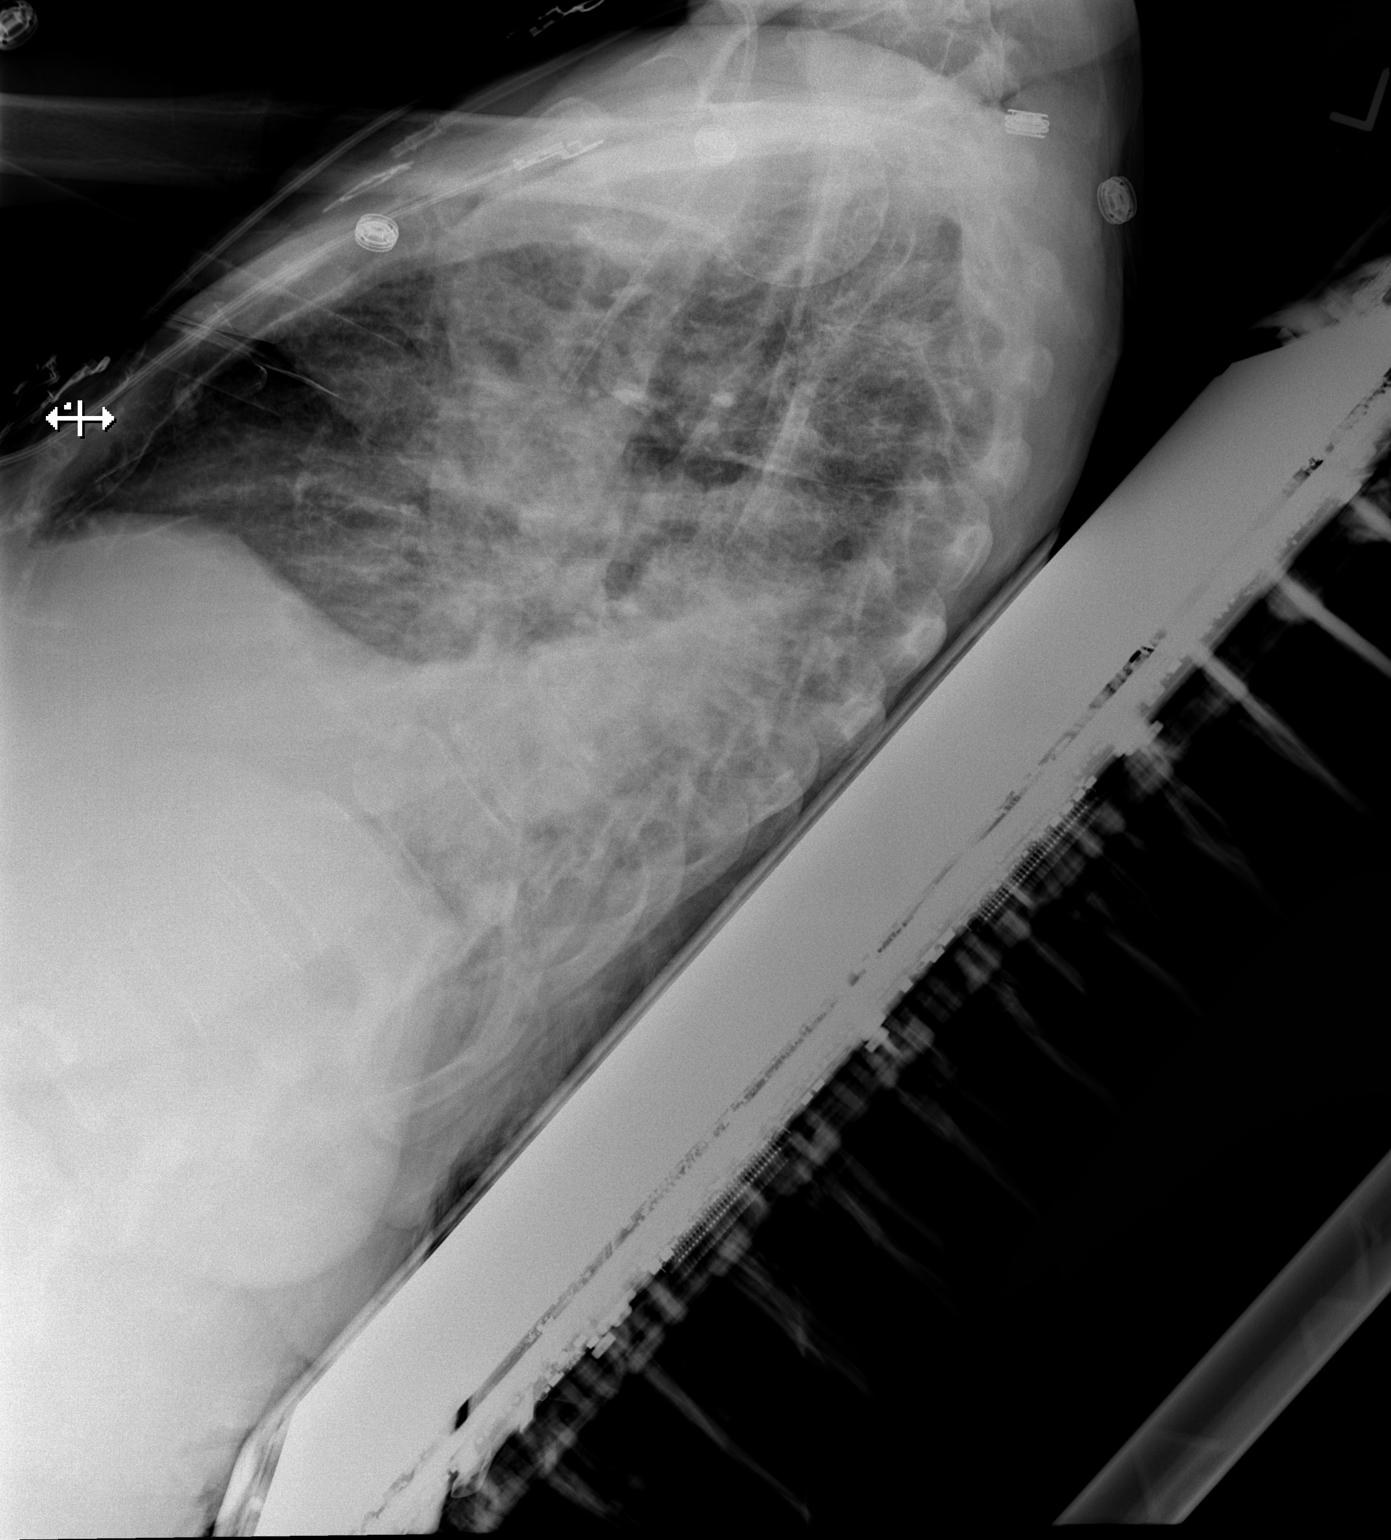

[x chest ap]
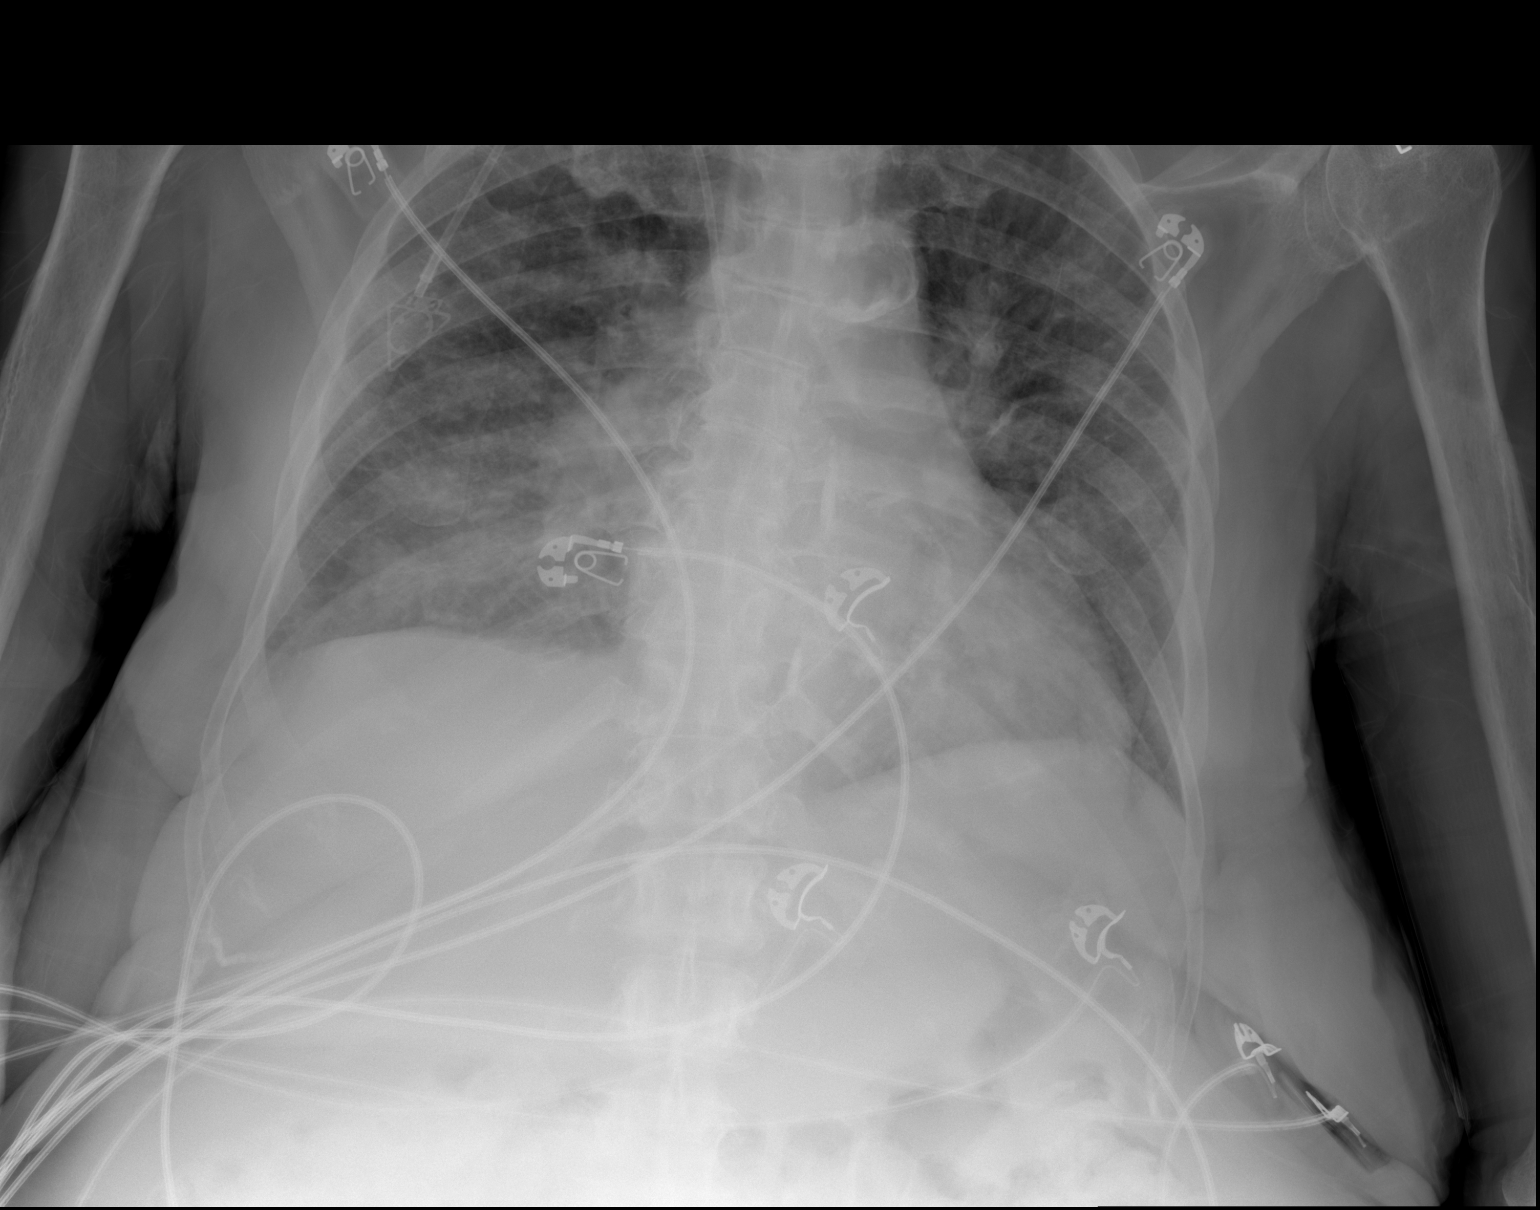

[2 of 2 positions shown; findings below may reference images not displayed]

FINDINGS: Cardiac shadow is stable. Aortic calcifications are seen. Right
chest wall port is noted in satisfactory position. Lungs are well
aerated bilaterally with patchy infiltrates bilaterally relatively
stable from the prior exam. No new focal abnormality is seen. Large
right pleural effusion is noted.
IMPRESSION: Stable bilateral infiltrates. Large right pleural effusion is noted.
